# Patient Record
Sex: Male | Born: 1975 | Race: White | Hispanic: No | Marital: Married | Smoking: Former smoker
Health system: Southern US, Community
[De-identification: ages and names within clinical notes are randomized; demographics above are authoritative.]

## PROBLEM LIST (undated history)

## (undated) DIAGNOSIS — M1990 Unspecified osteoarthritis, unspecified site: Secondary | ICD-10-CM

## (undated) DIAGNOSIS — K5792 Diverticulitis of intestine, part unspecified, without perforation or abscess without bleeding: Secondary | ICD-10-CM

## (undated) DIAGNOSIS — N200 Calculus of kidney: Secondary | ICD-10-CM

## (undated) HISTORY — PX: CHOLECYSTECTOMY: SHX55

## (undated) HISTORY — PX: TONSILLECTOMY: SUR1361

## (undated) HISTORY — DX: Calculus of kidney: N20.0

## (undated) HISTORY — DX: Unspecified osteoarthritis, unspecified site: M19.90

---

## 2005-02-10 ENCOUNTER — Emergency Department (HOSPITAL_COMMUNITY): Admission: EM | Admit: 2005-02-10 | Discharge: 2005-02-10 | Payer: Self-pay | Admitting: Family Medicine

## 2005-04-13 ENCOUNTER — Emergency Department (HOSPITAL_COMMUNITY): Admission: EM | Admit: 2005-04-13 | Discharge: 2005-04-13 | Payer: Self-pay | Admitting: Family Medicine

## 2005-04-14 ENCOUNTER — Encounter: Admission: RE | Admit: 2005-04-14 | Discharge: 2005-04-14 | Payer: Self-pay | Admitting: Family Medicine

## 2006-05-23 ENCOUNTER — Emergency Department (HOSPITAL_COMMUNITY): Admission: EM | Admit: 2006-05-23 | Discharge: 2006-05-23 | Payer: Self-pay | Admitting: Emergency Medicine

## 2007-01-27 HISTORY — PX: VASECTOMY: SHX75

## 2007-02-28 ENCOUNTER — Encounter: Admission: RE | Admit: 2007-02-28 | Discharge: 2007-02-28 | Payer: Self-pay | Admitting: Internal Medicine

## 2007-04-06 ENCOUNTER — Emergency Department (HOSPITAL_COMMUNITY): Admission: EM | Admit: 2007-04-06 | Discharge: 2007-04-06 | Payer: Self-pay | Admitting: Emergency Medicine

## 2008-09-06 ENCOUNTER — Emergency Department (HOSPITAL_COMMUNITY): Admission: EM | Admit: 2008-09-06 | Discharge: 2008-09-06 | Payer: Self-pay | Admitting: Emergency Medicine

## 2008-11-19 ENCOUNTER — Emergency Department (HOSPITAL_COMMUNITY): Admission: EM | Admit: 2008-11-19 | Discharge: 2008-11-19 | Payer: Self-pay | Admitting: Emergency Medicine

## 2009-03-08 ENCOUNTER — Encounter: Admission: RE | Admit: 2009-03-08 | Discharge: 2009-03-08 | Payer: Self-pay | Admitting: Internal Medicine

## 2009-09-17 ENCOUNTER — Encounter: Admission: RE | Admit: 2009-09-17 | Discharge: 2009-09-17 | Payer: Self-pay | Admitting: Unknown Physician Specialty

## 2010-05-03 LAB — POCT URINALYSIS DIP (DEVICE)
Hgb urine dipstick: NEGATIVE
Nitrite: NEGATIVE
Protein, ur: 30 mg/dL — AB
Urobilinogen, UA: 1 mg/dL (ref 0.0–1.0)

## 2010-05-09 ENCOUNTER — Inpatient Hospital Stay (INDEPENDENT_AMBULATORY_CARE_PROVIDER_SITE_OTHER)
Admission: RE | Admit: 2010-05-09 | Discharge: 2010-05-09 | Disposition: A | Discharge status: Home / Self Care | Payer: BLUE CROSS/BLUE SHIELD | Source: Ambulatory Visit | Attending: Emergency Medicine | Admitting: Emergency Medicine

## 2010-05-09 DIAGNOSIS — L255 Unspecified contact dermatitis due to plants, except food: Secondary | ICD-10-CM

## 2010-09-04 ENCOUNTER — Ambulatory Visit (INDEPENDENT_AMBULATORY_CARE_PROVIDER_SITE_OTHER): Payer: BLUE CROSS/BLUE SHIELD | Admitting: General Surgery

## 2010-09-04 ENCOUNTER — Encounter (INDEPENDENT_AMBULATORY_CARE_PROVIDER_SITE_OTHER): Payer: Self-pay | Admitting: General Surgery

## 2010-09-04 NOTE — Progress Notes (Signed)
Addended byLiliana Cline on: 09/04/2010 11:55 AM   Modules accepted: Orders

## 2010-09-04 NOTE — Progress Notes (Signed)
Subjective:   Obesity and joint pain  Patient ID: Barry Parsons, male   DOB: 1975/03/21, 35 y.o.   MRN: [id number]  HPI Patient is a 35 year old male here for consideration for surgical treatment for morbid obesity. He states that he has struggled with his weight since he was a young adult. He is able to lose a lot of weight at one time but then experiences inevitable weight regain. He is concerned about his long-term health and has significant arthritis and chronic joint pain. His wife has had successful lap band surgery. He is interested in lap band surgery due to its less invasive nature. He is concerned about his long-term health and his activity related to his arthritis he has been our initial information seminar and of course is very knowledgeable about lap band because of his wife's surgery.  Past Medical History  Diagnosis Date  . Weight increase   . Sleep difficulties   . Arthritis   . Kidney stones    Past Surgical History  Procedure Date  . Vasectomy   . Tonsillectomy    Current Outpatient Prescriptions  Medication Sig Dispense Refill  . traMADol (ULTRAM) 50 MG tablet Ad lib.       History  Substance Use Topics  . Smoking status: Former Games developer  . Smokeless tobacco: Not on file  . Alcohol Use: Yes   Review of Systems  Constitutional: Negative.   HENT: Negative.   Eyes: Negative.   Respiratory: Negative.   Cardiovascular: Negative.   Gastrointestinal: Negative for nausea, vomiting, abdominal pain and blood in stool.       Occ reflux  Genitourinary: Negative.   Musculoskeletal: Positive for arthralgias.  Neurological: Negative.        Objective:   Physical Exam General: Obese but otherwise a well appearing Caucasian male Skin: No rash or infection Lymph nodes: No palpable cervical or supraclavicular or axillary nodes Lungs: Clear without wheezing or increased work of breathing Cardiovascular: Regular rate and rhythm without murmur. No JVD or edema. Peripheral  pulses intact. Abdomen: Obese. Soft and nontender. No palpable masses or organomegaly. No hernias. Extremities: No joint swelling or deformity or edema Neurologic: Alert and fully oriented. Gait normal.    Assessment:     35 year old male with progressive morbid obesity unresponsive to medical management and comorbidities of arthritis. He prefers lap band surgery and I believe this would be an excellent choice for him. We discussed the nature of the surgery and expected results in detail. We discussed operative risks of anesthetic complications, bleeding, infection, intestinal injury, and obstruction. We discussed long-term risks of failure to lose weight, erosion, slippage, mechanical failure, and others. He was given a complete consent form to review.    Plan:     We'll proceed with preoperative psychologic and nutrition the assessment, upper GI series, and lab work. I'll see him back after the studies

## 2010-09-04 NOTE — Patient Instructions (Signed)
Call for any questions about preoperative tests.

## 2010-09-13 LAB — COMPREHENSIVE METABOLIC PANEL
Albumin: 4.7 g/dL (ref 3.5–5.2)
CO2: 20 mEq/L (ref 19–32)
Calcium: 9 mg/dL (ref 8.4–10.5)
Chloride: 102 mEq/L (ref 96–112)
Glucose, Bld: 102 mg/dL — ABNORMAL HIGH (ref 70–99)
Potassium: 4.2 mEq/L (ref 3.5–5.3)
Sodium: 143 mEq/L (ref 135–145)
Total Bilirubin: 0.5 mg/dL (ref 0.3–1.2)
Total Protein: 7.5 g/dL (ref 6.0–8.3)

## 2010-09-13 LAB — LIPID PANEL
Cholesterol: 194 mg/dL (ref 0–200)
HDL: 38 mg/dL — ABNORMAL LOW (ref >39)
LDL Cholesterol: 132 mg/dL — ABNORMAL HIGH (ref 0–99)
Triglycerides: 121 mg/dL (ref <150)

## 2010-09-13 LAB — CBC WITH DIFFERENTIAL/PLATELET
Basophils Absolute: 0 10*3/uL (ref 0.0–0.1)
Lymphocytes Relative: 32 % (ref 12–46)
MCH: 31.7 pg (ref 26.0–34.0)
MCHC: 33 g/dL (ref 30.0–36.0)
Monocytes Relative: 5 % (ref 3–12)
Neutrophils Relative %: 59 % (ref 43–77)
Platelets: 228 10*3/uL (ref 150–400)
RBC: 5.01 MIL/uL (ref 4.22–5.81)
RDW: 13.2 % (ref 11.5–15.5)

## 2010-09-13 LAB — T4: T4, Total: 10.2 ug/dL (ref 5.0–12.5)

## 2010-09-13 LAB — TSH: TSH: 2.367 u[IU]/mL (ref 0.350–4.500)

## 2010-09-22 ENCOUNTER — Encounter: Payer: BLUE CROSS/BLUE SHIELD | Attending: General Surgery | Admitting: *Deleted

## 2010-09-22 ENCOUNTER — Encounter: Payer: Self-pay | Admitting: *Deleted

## 2010-09-22 DIAGNOSIS — Z01818 Encounter for other preprocedural examination: Secondary | ICD-10-CM | POA: Insufficient documentation

## 2010-09-22 DIAGNOSIS — Z713 Dietary counseling and surveillance: Secondary | ICD-10-CM | POA: Insufficient documentation

## 2010-09-22 NOTE — Patient Instructions (Signed)
   Follow Pre-Op Nutrition Goals to prepare for LAGB Surgery.   Call the [facility] at [phone] once you have been given your surgery date to enrolled in the Pre-Op Nutrition Class. You will need to attend this nutrition class 3-4 weeks prior to your surgery. 

## 2010-09-22 NOTE — Progress Notes (Signed)
  Patient was seen on 09/22/2010 for Pre-Operative LAGB Nutrition Assessment. Assessment and letter of approval faxed to [facility] Bariatric Surgery Program coordinator on 09/22/2010.    Handouts given during visit include:  Pre-Op Goals Handout  Patient to call for Pre-Op and Post-Op Nutrition Education at the [facility] when surgery is scheduled.

## 2010-09-24 ENCOUNTER — Ambulatory Visit (HOSPITAL_COMMUNITY)
Admission: RE | Admit: 2010-09-24 | Discharge: 2010-09-24 | Disposition: A | Discharge status: Home / Self Care | Payer: BLUE CROSS/BLUE SHIELD | Source: Ambulatory Visit | Attending: General Surgery | Admitting: General Surgery

## 2010-09-24 ENCOUNTER — Other Ambulatory Visit: Payer: Self-pay

## 2010-09-24 DIAGNOSIS — K449 Diaphragmatic hernia without obstruction or gangrene: Secondary | ICD-10-CM | POA: Insufficient documentation

## 2010-09-24 DIAGNOSIS — M129 Arthropathy, unspecified: Secondary | ICD-10-CM | POA: Insufficient documentation

## 2010-11-05 ENCOUNTER — Encounter (INDEPENDENT_AMBULATORY_CARE_PROVIDER_SITE_OTHER): Payer: Self-pay | Admitting: General Surgery

## 2010-11-05 ENCOUNTER — Ambulatory Visit (INDEPENDENT_AMBULATORY_CARE_PROVIDER_SITE_OTHER): Payer: BC Managed Care – PPO | Admitting: General Surgery

## 2010-11-05 NOTE — Progress Notes (Signed)
Patient returns for a preop visit prior to planned lap band surgery. Rereviewed his workup to date. There were no concerns on psychologic or nutrition evaluation. Lab work was unremarkable.  His upper GI series did show a small hiatal hernia. He does have mild reflux controlled with over-the-counter medications. I discussed would look for a hiatal hernia during surgery and repair this if it looked significant.  The consent form was reviewed and all his questions were answered.

## 2010-11-05 NOTE — Patient Instructions (Signed)
Call for any questions you have preoperatively

## 2010-11-14 ENCOUNTER — Other Ambulatory Visit (INDEPENDENT_AMBULATORY_CARE_PROVIDER_SITE_OTHER): Payer: Self-pay | Admitting: General Surgery

## 2010-11-14 ENCOUNTER — Encounter (HOSPITAL_COMMUNITY)
Admission: RE | Admit: 2010-11-14 | Discharge: 2010-11-14 | Disposition: A | Discharge status: Home / Self Care | Payer: BC Managed Care – PPO | Source: Ambulatory Visit | Attending: General Surgery | Admitting: General Surgery

## 2010-11-14 LAB — CBC
MCHC: 35.1 g/dL (ref 30.0–36.0)
MCV: 89.4 fL (ref 78.0–100.0)
RDW: 12.2 % (ref 11.5–15.5)

## 2010-11-14 LAB — COMPREHENSIVE METABOLIC PANEL
AST: 22 U/L (ref 0–37)
BUN: 16 mg/dL (ref 6–23)
CO2: 25 mEq/L (ref 19–32)
Chloride: 101 mEq/L (ref 96–112)
Creatinine, Ser: 0.69 mg/dL (ref 0.50–1.35)
GFR calc Af Amer: >90 mL/min (ref >90)
GFR calc non Af Amer: >90 mL/min (ref >90)
Potassium: 3.9 mEq/L (ref 3.5–5.1)
Sodium: 136 mEq/L (ref 135–145)
Total Protein: 7.6 g/dL (ref 6.0–8.3)

## 2010-11-14 LAB — DIFFERENTIAL
Eosinophils Absolute: 0.3 10*3/uL (ref 0.0–0.7)
Eosinophils Relative: 3 % (ref 0–5)
Lymphocytes Relative: 28 % (ref 12–46)
Lymphs Abs: 2.5 10*3/uL (ref 0.7–4.0)
Neutrophils Relative %: 64 % (ref 43–77)

## 2010-11-14 LAB — SURGICAL PCR SCREEN: Staphylococcus aureus: POSITIVE — AB

## 2010-11-18 ENCOUNTER — Ambulatory Visit (HOSPITAL_COMMUNITY)
Admission: RE | Admit: 2010-11-18 | Discharge: 2010-11-18 | Disposition: A | Discharge status: Home / Self Care | Payer: BC Managed Care – PPO | Source: Ambulatory Visit | Attending: General Surgery | Admitting: General Surgery

## 2010-11-18 ENCOUNTER — Ambulatory Visit (HOSPITAL_COMMUNITY): Payer: BC Managed Care – PPO

## 2010-11-18 DIAGNOSIS — Z01812 Encounter for preprocedural laboratory examination: Secondary | ICD-10-CM | POA: Insufficient documentation

## 2010-11-18 DIAGNOSIS — Z6841 Body Mass Index (BMI) 40.0 and over, adult: Secondary | ICD-10-CM

## 2010-11-18 HISTORY — PX: LAPAROSCOPIC GASTRIC BANDING: SHX1100

## 2010-11-19 NOTE — Op Note (Signed)
NAMEMarland Kitchen  SEVAG, SHEARN NO.:  [id number]  MEDICAL RECORD NO.:  [id number]  LOCATION:  DAYL                         FACILITY:  [facility]  PHYSICIAN:  Sharlet Salina T. Braylee Bosher, M.D.DATE OF BIRTH:  July 10, 1975  DATE OF PROCEDURE:  11/18/2010 DATE OF DISCHARGE:                              OPERATIVE REPORT   PREOPERATIVE DIAGNOSES:  Morbid obesity.  POSTOPERATIVE DIAGNOSIS:  Morbid obesity.  SURGICAL PROCEDURE:  Placement of laparoscopic adjustable gastric band.  SURGEON:  Lorne Skeens. Orlena Garmon, M.D.  ASSISTANT:  Sandria Bales. Ezzard Standing, M.D.  ANESTHESIA:  General.  BRIEF HISTORY:  The patient is a 35 year old male with a history of progressive morbid obesity that has been unresponsive to multiple efforts at medical management.  He presents at a BMI of 43.35, 317 pounds, at 5 feet 11.5 inches and comorbidity of arthritis.  After extensive preoperative discussion regarding options and complications detailed elsewhere, we have elected to proceed with placement of laparoscopic adjustable gastric band for treatment of his morbid obesity.  DESCRIPTION OF OPERATION:  The patient was brought to the operating room, placed in supine position on the operating table and general endotracheal anesthesia was induced.  The abdomen was widely sterilely prepped and draped.  He received preoperative IV antibiotics.  PAS were in place.  Correct patient and procedure were verified.  Local anesthesia was used to infiltrate the trocar sites.  Access was obtained in the left upper quadrant midclavicular line with a 5 mm Optiview trocar without difficulty and pneumoperitoneum established.  Under direct vision, an 11 mm trocar was placed above the left umbilicus for the camera port.  A 15 mm trocar in the right upper quadrant midclavicular line, a 5 mm trocar laterally in the right upper quadrant and through a 5 mm subxiphoid site, the Nathanson retractor was placed in the left lobe of liver,  elevated with excellent exposure of the upper stomach and hiatus.  The patient was placed in reverse Trendelenburg. The peritoneum overlying the left crus of the angle of His was incised and careful blunt dissection carried back along the left crus toward the retrogastric area.  The pars flaccida was opened with a hook cautery and the base of the right crus and there crossing fat was identified and the peritoneum here opened.  The finger dissector was then passed without difficulty retrogastrically and deployed up through the previously dissected area of angle of His.  An AP large flushed band system was introduced into the abdomen.  The tubing placed and the finger dissector brought out through the retrogastric tunnel and the band brought back through the retrogastric tunnel without difficulty.  With the sizing tube introduced orally into the stomach, the band was snapped into place without difficulty.  The sizing tube was removed.  Holding tubing toward the patient's feet, the fundus was sutured up over the band to the small gastric pouch with 3 interrupted 2-0 Ethibond sutures.  The band appeared to be in excellent position.  There was no evidence of bleeding or trocar injury.  The tubing was brought out through the right mid abdominal site.  The Nathanson retractor was removed under direct vision and all CO2 evacuated.  Trocars removed.  The right mid abdominal incision was lengthened slightly and a subcutaneous pocket created lateral to this.  The tubing was cut and attached to the port to which a piece of Prolene mesh had been sutured to the back.  The port was placed in a subcutaneous position just lateral to the incision.  The tubing introduced smoothly into the abdomen.  This incision was closed with running subcutaneous 3-0 Vicryl and all incisions closed with subcuticular Monocryl and Dermabond.  Sponge and needle counts correct. The patient was taken to recovery in good  condition.     Lorne Skeens. Gail Creekmore, M.D.     Tory Emerald  D:  11/18/2010  T:  11/18/2010  Job:  [id number]  Electronically Signed by [name] M.D. on 11/19/2010 10:53:52 AM

## 2010-11-21 ENCOUNTER — Telehealth (INDEPENDENT_AMBULATORY_CARE_PROVIDER_SITE_OTHER): Payer: Self-pay

## 2010-11-21 ENCOUNTER — Other Ambulatory Visit (INDEPENDENT_AMBULATORY_CARE_PROVIDER_SITE_OTHER): Payer: Self-pay | Admitting: General Surgery

## 2010-11-21 DIAGNOSIS — G8918 Other acute postprocedural pain: Secondary | ICD-10-CM

## 2010-11-21 MED ORDER — OXYCODONE-ACETAMINOPHEN 5-325 MG/5ML PO SOLN: ORAL | Status: DC

## 2010-11-21 NOTE — Telephone Encounter (Signed)
Pt called. Doing well except still having enough discomfort that pt states he needs to get refill of his roxicet prior to weekend. Pt advised will will review this with Dr Johna Sheriff and contact pt. Pt aware someone will have to pick up written rx at office if Dr Johna Sheriff oks refill. Pt can be reached at [phone].

## 2010-11-21 NOTE — Telephone Encounter (Signed)
Called pt to p/u Rx at front desk. Pt said he will sen his wife to p/u Rx./[facility]

## 2010-11-25 ENCOUNTER — Ambulatory Visit
Admission: RE | Admit: 2010-11-25 | Discharge: 2010-11-25 | Disposition: A | Discharge status: Home / Self Care | Payer: BC Managed Care – PPO | Source: Ambulatory Visit | Attending: General Surgery | Admitting: General Surgery

## 2010-11-25 ENCOUNTER — Telehealth (INDEPENDENT_AMBULATORY_CARE_PROVIDER_SITE_OTHER): Payer: Self-pay

## 2010-11-25 ENCOUNTER — Other Ambulatory Visit (INDEPENDENT_AMBULATORY_CARE_PROVIDER_SITE_OTHER): Payer: Self-pay | Admitting: General Surgery

## 2010-11-25 DIAGNOSIS — R071 Chest pain on breathing: Secondary | ICD-10-CM

## 2010-11-25 DIAGNOSIS — Z9884 Bariatric surgery status: Secondary | ICD-10-CM

## 2010-11-25 NOTE — Telephone Encounter (Addendum)
Patient called c/o sharp pains when taking deep breaths, states he has slept sitting up through the weekend and has had no relief.  He feel's a sharp shooting pain when he take deep breaths.  Pain started in left shoulder and extends down into abdomen also. Informed patient that I will review this with Dr. Johna Sheriff and call patient back with further instructions. (s/p Gastric Bypass/RNY 11/18/10)

## 2010-11-26 ENCOUNTER — Encounter (INDEPENDENT_AMBULATORY_CARE_PROVIDER_SITE_OTHER): Payer: Self-pay | Admitting: General Surgery

## 2010-11-26 ENCOUNTER — Ambulatory Visit (INDEPENDENT_AMBULATORY_CARE_PROVIDER_SITE_OTHER): Payer: BC Managed Care – PPO | Admitting: General Surgery

## 2010-11-26 VITALS — BP 134/90 | HR 76 | Temp 97.4°F | Resp 16 | Ht 72.0 in | Wt 310.2 lb

## 2010-11-26 DIAGNOSIS — Z09 Encounter for follow-up examination after completed treatment for conditions other than malignant neoplasm: Secondary | ICD-10-CM

## 2010-11-26 NOTE — Progress Notes (Signed)
Patient returns for followup 9 days post lap band placement. He is seen early due to a complaint of chest pain. He states that for the past 3 days he has had pain when he takes a deep breath. This is sharp pain in his left chest radiating up around his left shoulder and out to his lateral left chest. It is unchanged over the past 3 days. He has no nausea or vomiting and is tolerating his fluids well. He does not feel short of breath. No cough.  On examination he is afebrile with normal vital signs. He does not appear at all ill. No increased work of breathing. Breath sounds are clear and equal. Abdomen is soft and nontender and his incisions look fine.  We obtained a CBC which shows a normal white count and hemoglobin. Chest x-ray was unremarkable.  Assessment and plan: Left chest pain with deep breath following lap band. Strongly suspect this is just some postoperative irritation under the diaphragm and his surgical site. We discussed the possibility of pulmonary embolus or stomach injury but given his exam and lab and chest x-ray results I think this is highly unlikely. Her going to observe this closely for now. He will call me if he notices any worsening. We discussed going ahead now with a CT scan of the chest and Gastrografin swallow but I think this is low yield and he is comfortable with close observation for now. He is an appointment to see me next week A. test.

## 2010-12-01 NOTE — Telephone Encounter (Signed)
Patient called requesting a refill on his pain medication, Dr. Johna Sheriff reviewed request and approved Roxicet Elixir, patient also need's to have a Chest X-ray and CBC w/diff drawn.  Patient aware and will go to [facility] imaging for CXR and his PCP for labwork.

## 2010-12-05 ENCOUNTER — Other Ambulatory Visit (INDEPENDENT_AMBULATORY_CARE_PROVIDER_SITE_OTHER): Payer: Self-pay

## 2010-12-05 ENCOUNTER — Encounter (INDEPENDENT_AMBULATORY_CARE_PROVIDER_SITE_OTHER): Payer: Self-pay | Admitting: General Surgery

## 2010-12-05 ENCOUNTER — Ambulatory Visit (INDEPENDENT_AMBULATORY_CARE_PROVIDER_SITE_OTHER): Payer: BC Managed Care – PPO | Admitting: General Surgery

## 2010-12-05 VITALS — BP 138/88 | HR 68 | Temp 98.3°F | Resp 20 | Ht 71.5 in | Wt 301.5 lb

## 2010-12-05 DIAGNOSIS — Z09 Encounter for follow-up examination after completed treatment for conditions other than malignant neoplasm: Secondary | ICD-10-CM

## 2010-12-05 DIAGNOSIS — G8918 Other acute postprocedural pain: Secondary | ICD-10-CM

## 2010-12-05 MED ORDER — OXYCODONE-ACETAMINOPHEN 5-325 MG/5ML PO SOLN: ORAL | Status: DC

## 2010-12-05 NOTE — Patient Instructions (Signed)
Call if symptoms worsen.

## 2010-12-05 NOTE — Telephone Encounter (Signed)
RX for Roxicet Elixir 5/325 mg, take 5-10 ml,  q4 hrs, prn pain, 250 ml, 0 refills, written in office

## 2010-12-05 NOTE — Progress Notes (Signed)
Patient is now 2-1/2 weeks following LAP-BAND placement. He has had difficulties with epigastric and chest pain but this is gradually getting better. 2 days ago he had another episode of fairly significant chest pain but has felt fine the last 2 days. He does have some restriction in the morning which eases through the day. On exam he appears well. Abdomen is soft and nontender. Lungs are clear.  Assessment and plan: Status post lap band. He has had some unusual pain but has not appeared ill and is steadily improving. We will therefore hold off on any more detailed workup. We did not do an adjustment. He will call if he is not feeling steadily better I will see him back in 2 weeks.

## 2011-01-01 ENCOUNTER — Ambulatory Visit (INDEPENDENT_AMBULATORY_CARE_PROVIDER_SITE_OTHER): Payer: BC Managed Care – PPO | Admitting: General Surgery

## 2011-01-01 NOTE — Progress Notes (Signed)
Patient returns following LAP-BAND placement of APL band on 10 2312. He initially was having some epigastric and chest pain postoperatively but this has resolved. He feels his appetite is increased and he is stopping eating more by willpower than feeling full. His weight is up about 5 pounds since his last postop visit. He's having no vomiting or regurgitation.  On exam he appears well and his incisions and port site looks fine  With this information we went ahead with an initial fill today of 2 cc which brings him to a total of 5 cc. We reviewed diet and exercise stretches. He will return in 4 weeks.

## 2011-01-01 NOTE — Patient Instructions (Signed)
Beginning her solid food tomorrow very cautiously. Concentrate on small bites and eating slowly. Due not drink for 30 minutes before and one hour after meals. Work toward 45 minutes of steady exercise at least 3 times per week.

## 2011-01-01 NOTE — Progress Notes (Signed)
Patient had some difficulty with water after we added 2 cc to his band and I removed 1 cc so that now he has a total of 4 cc.

## 2011-02-05 ENCOUNTER — Ambulatory Visit (INDEPENDENT_AMBULATORY_CARE_PROVIDER_SITE_OTHER): Payer: BC Managed Care – PPO | Admitting: General Surgery

## 2011-02-05 ENCOUNTER — Encounter (INDEPENDENT_AMBULATORY_CARE_PROVIDER_SITE_OTHER): Payer: Self-pay | Admitting: General Surgery

## 2011-02-05 NOTE — Progress Notes (Signed)
Patient returns for follow up of lab and placed October 2012. He has had one fill. He noticed definite increase in restriction but he feels that a recent weeks this is easing and he is able to eat more. He is having rare vomiting only if he eats 2 faster does not chew well. On questioning he really has not been exercising.  On exam his weight is actually up 3 pounds from his last visit at 310 which is the same as his preop weight.  With this information we went ahead with a 1 cc fill today debridement 5 cc. Were reviewed the importance of exercise. We discussed diet and the importance of getting slowly with small bites he will return in 6 weeks.

## 2011-02-05 NOTE — Patient Instructions (Signed)
Begin regular exercise. Concentrate on small bites, eating slowly, chewing well.

## 2011-03-26 ENCOUNTER — Encounter (INDEPENDENT_AMBULATORY_CARE_PROVIDER_SITE_OTHER): Payer: BC Managed Care – PPO | Admitting: General Surgery

## 2011-06-24 ENCOUNTER — Emergency Department (INDEPENDENT_AMBULATORY_CARE_PROVIDER_SITE_OTHER): Payer: BC Managed Care – PPO

## 2011-06-24 ENCOUNTER — Emergency Department (HOSPITAL_COMMUNITY)
Admission: EM | Admit: 2011-06-24 | Discharge: 2011-06-24 | Disposition: A | Discharge status: Home / Self Care | Payer: BC Managed Care – PPO | Source: Home / Self Care | Attending: Emergency Medicine | Admitting: Emergency Medicine

## 2011-06-24 ENCOUNTER — Encounter (HOSPITAL_COMMUNITY): Payer: Self-pay | Admitting: *Deleted

## 2011-06-24 DIAGNOSIS — S139XXA Sprain of joints and ligaments of unspecified parts of neck, initial encounter: Secondary | ICD-10-CM

## 2011-06-24 DIAGNOSIS — S1093XA Contusion of unspecified part of neck, initial encounter: Secondary | ICD-10-CM

## 2011-06-24 DIAGNOSIS — S0990XA Unspecified injury of head, initial encounter: Secondary | ICD-10-CM

## 2011-06-24 DIAGNOSIS — S0003XA Contusion of scalp, initial encounter: Secondary | ICD-10-CM

## 2011-06-24 DIAGNOSIS — S0083XA Contusion of other part of head, initial encounter: Secondary | ICD-10-CM

## 2011-06-24 MED ORDER — CYCLOBENZAPRINE HCL 10 MG PO TABS: 10.0000 mg | ORAL_TABLET | Freq: Three times a day (TID) | ORAL | Status: AC | PRN

## 2011-06-24 MED ORDER — IBUPROFEN 600 MG PO TABS: 600.0000 mg | ORAL_TABLET | Freq: Four times a day (QID) | ORAL | Status: AC | PRN

## 2011-06-24 NOTE — ED Provider Notes (Signed)
History     CSN: [id number]  Arrival date & time 06/24/11  1651   First MD Initiated Contact with Patient 06/24/11 1652      Chief Complaint  Patient presents with  . Head Injury    (Consider location/radiation/quality/duration/timing/severity/associated sxs/prior treatment) HPI Comments: Patient reports that Saturday during a camper he fell and he hit his head. Patient describes he did not lose consciousness and was perfectly aware of what happened afterwards. He is been having headache since then ( points to posterior right-sided cervical region ). Is also been feeling dizzy mainly when he stands up. "As I think I got a bad cervical whiplash "  During review of systems patient specifically denied feeling nauseous, vomiting, visual changes, weakness of any of his upper or lower extremities.   Patient is a 36 y.o. male presenting with head injury. The history is provided by the patient.  Head Injury  The incident occurred more than 2 days ago. He came to the ER via walk-in. The injury mechanism was a direct blow. There was no loss of consciousness. There was no blood loss. The quality of the pain is described as dull. The pain is at a severity of 6/10. The pain is moderate. The pain has been constant since the injury. Pertinent negatives include no numbness, no blurred vision, no vomiting, no tinnitus, no disorientation, no weakness and no memory loss. He was found conscious by EMS personnel. He has tried nothing for the symptoms.    Past Medical History  Diagnosis Date  . Weight increase   . Sleep difficulties   . Arthritis   . Kidney stones     Past Surgical History  Procedure Date  . Vasectomy   . Tonsillectomy   . Laparoscopic gastric banding 11/18/10    Family History  Problem Relation Age of Onset  . Heart disease Mother     History  Substance Use Topics  . Smoking status: Former Smoker    Quit date: 01/27/2008  . Smokeless tobacco: Never Used  . Alcohol Use:  Yes      Review of Systems  Constitutional: Negative for chills, diaphoresis, activity change, appetite change and fatigue.  HENT: Positive for neck pain and neck stiffness. Negative for hearing loss, tinnitus and ear discharge.   Eyes: Negative for blurred vision and redness.  Gastrointestinal: Negative for nausea and vomiting.  Musculoskeletal: Negative for back pain.  Skin: Positive for wound. Negative for color change and rash.  Neurological: Positive for dizziness and headaches. Negative for tremors, seizures, syncope, facial asymmetry, speech difficulty, weakness and numbness.  Psychiatric/Behavioral: Negative for memory loss.    Allergies  Review of patient's allergies indicates no known allergies.  Home Medications   Current Outpatient Rx  Name Route Sig Dispense Refill  . TRAMADOL HCL 50 MG PO TABS  as needed.       BP 146/90  Pulse 64  Temp(Src) 98.6 F (37 C) (Oral)  Resp 18  SpO2 97%  Physical Exam  Nursing note and vitals reviewed. Constitutional: He is oriented to person, place, and time. He appears well-developed and well-nourished.  HENT:  Head: Normocephalic and atraumatic.  Eyes: Conjunctivae are normal.  Neck: Neck supple. Muscular tenderness present. No spinous process tenderness present. No rigidity. Decreased range of motion present. No tracheal deviation and no edema present. No Brudzinski's sign noted. No thyromegaly present.  Abdominal: He exhibits no distension.  Musculoskeletal: He exhibits tenderness.       Back:  Lymphadenopathy:  He has no cervical adenopathy.  Neurological: He is alert and oriented to person, place, and time. He has normal strength. He displays no atrophy and no tremor. No cranial nerve deficit or sensory deficit. He exhibits normal muscle tone. Coordination normal.  Skin: No rash noted. No erythema.    ED Course  Procedures (including critical care time)  Labs Reviewed - No data to display No results  found.   No diagnosis found.    MDM  Head and neck contusion. Without loss of consciousness seen and evaluated after 5 days of reported injury. Patient had no neurological focal deficits. Exam was significant for right-sided trapezium discomfort elicited with palpation and active range of motion of his right shoulder and neck. Patient was instructed about symptoms that will work further evaluation in the emergency department patient agree with treatment plan and observation.        Jimmie Molly, MD 06/24/11 1850

## 2011-06-24 NOTE — ED Notes (Addendum)
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

## 2011-06-24 NOTE — Discharge Instructions (Signed)
  As discussed your symptoms at this point are more consistent with muscular contusions and sprains mostly in your  cervical region and trapezium. Recommended you take this muscle relaxer and ibuprophen for pain and discomfort. We also discussed what symptoms, aren't further evaluation in the emergency department. Read your discharge instructions as well for further information    Cervical Sprain A cervical sprain is when the ligaments in the neck stretch or tear. The ligaments are the tissues that hold the neck bones in place. HOME CARE   Put ice on the injured area.   Put ice in a plastic bag.   Place a towel between your skin and the bag.   Leave the ice on for 15 to 20 minutes, 3 to 4 times a day.   Only take medicine as told by your doctor.   Keep all doctor visits as told.   Keep all physical therapy visits as told.   If your doctor gives you a neck collar, wear it as told.   Do not drive while wearing a neck collar.   Adjust your work station so that you have good posture while you work.   Avoid positions and activities that make your problems worse.   Warm up and stretch before being active.  GET HELP RIGHT AWAY IF:   You are bleeding or your stomach is upset.   You have an allergic reaction to your medicine.   Your problems (symptoms) get worse.   You develop new problems.   You lose feeling (numbness) or you cannot move (paralysis) any part of your body.   You have tingling or weakness in any part of your body.   Your pain is not controlled with medicine.   You cannot take less pain medicine over time as planned.   Your activity level does not improve as expected.  MAKE SURE YOU:   Understand these instructions.   Will watch your condition.   Will get help right away if you are not doing well or get worse.  Document Released: 07/01/2007 Document Revised: 01/01/2011 Document Reviewed: 10/16/2010 [employer] Patient Information 2012 [place], [place].

## 2011-09-16 ENCOUNTER — Telehealth (INDEPENDENT_AMBULATORY_CARE_PROVIDER_SITE_OTHER): Payer: Self-pay

## 2011-09-16 ENCOUNTER — Other Ambulatory Visit (INDEPENDENT_AMBULATORY_CARE_PROVIDER_SITE_OTHER): Payer: Self-pay

## 2011-09-16 DIAGNOSIS — Z9884 Bariatric surgery status: Secondary | ICD-10-CM

## 2011-09-16 NOTE — Telephone Encounter (Signed)
Barry Parsons w/Nutrition Management called requesting a Referral for Lap Band Refresher.  Epic order placed.

## 2011-09-29 ENCOUNTER — Encounter: Payer: BC Managed Care – PPO | Attending: General Surgery | Admitting: *Deleted

## 2011-09-29 ENCOUNTER — Encounter: Payer: Self-pay | Admitting: *Deleted

## 2011-09-29 DIAGNOSIS — Z713 Dietary counseling and surveillance: Secondary | ICD-10-CM | POA: Insufficient documentation

## 2011-09-29 DIAGNOSIS — Z9884 Bariatric surgery status: Secondary | ICD-10-CM | POA: Insufficient documentation

## 2011-09-29 DIAGNOSIS — Z09 Encounter for follow-up examination after completed treatment for conditions other than malignant neoplasm: Secondary | ICD-10-CM | POA: Insufficient documentation

## 2011-09-29 NOTE — Progress Notes (Signed)
  Follow-up visit:  11 Months Post-Operative LAGB Surgery  Medical Nutrition Therapy:  Appt start time: 1115 end time:  1215.  Primary concerns today: Post-operative Bariatric Surgery Nutrition Management. Reinhold returns today to re-establish care and get back on track. He has not returned to [facility] since his Pre-Op assessment.  Dietary recall reveals excessive CHO intake, meal skipping, and large portions. Drinks regular carbonated beverages with ETOH but states "not often".  Participates in the BELT program and has recently added weight training. Wife is 2 yrs s/p LAGB as well.    Surgery date: 11/18/10 Start weight at [facility]: 319.0 lbs (09/22/10)  Weight today: 287.5 lbs Weight change: 31.5 lbs (since assessment) Total weight lost: 31.5 lbs BMI: 40.1 kg/m^2  Weight goal: 220 lbs % Weight goal met: 32%  TANITA  BODY COMP RESULTS  09/29/11   %Fat 43.1%   Fat Mass (lbs) 124.0   Fat Free Mass (lbs) 163.5   Total Body Water (lbs) 119.5   24-hr recall:  B (AM): Skips OR 12-16 oz oatmeal w/ raisins and brown sugar (in cafe at work) Snk (AM): None   L (PM): Leftovers or 3-4 oz grilled chicken on salad w/ creamy caesar dressing Snk (PM): 12 oz yogurt (from cafe) D (PM): Chicken chili w/ dirty rice Snk (PM): sweets/baked goods (10-12 oz)  Fluid intake: 95-100 oz (mainly water with flavor enhancer) Estimated total protein intake: 70-80 g  Medications: Tramadol (prn) Supplementation: None  Using straws: No Drinking while eating: Yes; reports taking "sips" Hair loss: No Carbonated beverages: Yes; drinks regular ginger ale with ETOH (liquor) - States "not often" N/V/D/C: None Last Lap-Band fill: 02/05/11; has not returned to surgeon since. Reports he is "definitely in the yellow zone".  Recent physical activity:  Participates in BELT program (M/W/F); recently started strength training on T/Th  Progress Towards Goal(s):  In progress.  Handouts given during visit include:  Bariatric  Surgery Specialized Post-Op Diet   Nutritional Diagnosis:  Ashley-3.3 Overweight/obesity As related to recent LAGB surgery.  As evidenced by patient returning to re-establish care d/t lack of post-op weight loss.    Intervention:  Nutrition education/reinforcement.  Monitoring/Evaluation:  Dietary intake, exercise, lap band fills, and body weight. Follow up in 4-6 weeks for 12 month post-op visit.

## 2011-09-29 NOTE — Patient Instructions (Addendum)
Goals:  Follow [facility] diet   Eat 3-6 small meals/snacks, every 3-5 hrs  Increase lean protein foods to meet 80g goal  Increase fluid intake to 64oz +  Add 15 grams of carbohydrate (fruit, whole grain, starchy vegetable) with meals  Avoid drinking 15 minutes before, during and 30 minutes after eating  Continue daily exercise.  Call surgeon for follow up appointment and possible fill.

## 2011-10-15 ENCOUNTER — Ambulatory Visit (INDEPENDENT_AMBULATORY_CARE_PROVIDER_SITE_OTHER): Payer: BC Managed Care – PPO | Admitting: Physician Assistant

## 2011-10-15 ENCOUNTER — Encounter (INDEPENDENT_AMBULATORY_CARE_PROVIDER_SITE_OTHER): Payer: Self-pay

## 2011-10-15 VITALS — BP 142/86 | HR 88 | Temp 97.2°F | Resp 18 | Ht 71.5 in | Wt 290.8 lb

## 2011-10-15 DIAGNOSIS — Z4651 Encounter for fitting and adjustment of gastric lap band: Secondary | ICD-10-CM

## 2011-10-15 NOTE — Patient Instructions (Signed)
Take clear liquids tonight. Thin protein shakes are ok to start tomorrow morning. Slowly advance your diet thereafter. Call us if you have persistent vomiting or regurgitation, night cough or reflux symptoms. Return as scheduled or sooner if you notice no changes in hunger/portion sizes.  

## 2011-10-15 NOTE — Progress Notes (Signed)
  HISTORY: Barry Parsons is a 36 y.o.male who received an AP-Large lap-band in October 2012 by Dr. Johna Sheriff. He was last seen in January for a fill and has since lost 16 lbs. He denies persistent regurgitation or reflux. He does complain of larger than needed portion sizes and persistent hunger. He's engaged in a regular exercise program.  VITAL SIGNS: Filed Vitals:   10/15/11 1405  BP: 142/86  Pulse: 88  Temp: 97.2 F (36.2 C)  Resp: 18    PHYSICAL EXAM: Physical exam reveals a very well-appearing 35 y.o.male in no apparent distress Neurologic: Awake, alert, oriented Psych: Bright affect, conversant Respiratory: Breathing even and unlabored. No stridor or wheezing Abdomen: Soft, nontender, nondistended to palpation. Incisions well-healed. No incisional hernias. Port easily palpated. Extremities: Atraumatic, good range of motion.  ASSESMENT: 36 y.o.  male  s/p AP-Large lap-band.   PLAN: The patient's port was accessed with a 20G Huber needle without difficulty. Clear fluid was aspirated and 1 mL saline was added to the port to give a total predicted volume of 6 mL. The patient was able to swallow water without difficulty following the procedure and was instructed to take clear liquids for the next 24-48 hours and advance slowly as tolerated.

## 2011-11-12 ENCOUNTER — Encounter (INDEPENDENT_AMBULATORY_CARE_PROVIDER_SITE_OTHER): Payer: BC Managed Care – PPO

## 2011-11-19 ENCOUNTER — Encounter (INDEPENDENT_AMBULATORY_CARE_PROVIDER_SITE_OTHER): Payer: Self-pay

## 2011-11-19 ENCOUNTER — Ambulatory Visit (INDEPENDENT_AMBULATORY_CARE_PROVIDER_SITE_OTHER): Payer: BC Managed Care – PPO | Admitting: Physician Assistant

## 2011-11-19 VITALS — BP 134/78 | HR 88 | Temp 98.1°F | Ht 71.5 in | Wt 285.2 lb

## 2011-11-19 DIAGNOSIS — Z4651 Encounter for fitting and adjustment of gastric lap band: Secondary | ICD-10-CM

## 2011-11-19 NOTE — Patient Instructions (Signed)
Take clear liquids tonight. Thin protein shakes are ok to start tomorrow morning. Slowly advance your diet thereafter. Call us if you have persistent vomiting or regurgitation, night cough or reflux symptoms. Return as scheduled or sooner if you notice no changes in hunger/portion sizes.  

## 2011-11-19 NOTE — Progress Notes (Signed)
  HISTORY: Barry Parsons is a 36 y.o.male who received an AP-Large lap-band in October 2012 by Dr. Johna Sheriff. He comes in with 6 lbs weight loss since his last visit. He denies regurgitation or reflux. He'd like a fill today as he's getting hungry soon after eating and he's eating more than expected.  VITAL SIGNS: Filed Vitals:   11/19/11 1216  BP: 134/78  Pulse: 88  Temp: 98.1 F (36.7 C)    PHYSICAL EXAM: Physical exam reveals a very well-appearing 36 y.o.male in no apparent distress Neurologic: Awake, alert, oriented Psych: Bright affect, conversant Respiratory: Breathing even and unlabored. No stridor or wheezing Abdomen: Soft, nontender, nondistended to palpation. Incisions well-healed. No incisional hernias. Port easily palpated. Extremities: Atraumatic, good range of motion.  ASSESMENT: 36 y.o.  male  s/p AP-Large lap-band.   PLAN: The patient's port was accessed with a 20G Huber needle without difficulty. Clear fluid was aspirated and 0.5 mL saline was added to the port to give a total predicted volume of 6.5 mL. The patient was able to swallow water without difficulty following the procedure and was instructed to take clear liquids for the next 24-48 hours and advance slowly as tolerated.

## 2011-12-31 ENCOUNTER — Encounter (INDEPENDENT_AMBULATORY_CARE_PROVIDER_SITE_OTHER): Payer: BC Managed Care – PPO

## 2012-05-19 ENCOUNTER — Emergency Department (HOSPITAL_COMMUNITY): Payer: BC Managed Care – PPO

## 2012-05-19 ENCOUNTER — Encounter (HOSPITAL_COMMUNITY): Payer: Self-pay | Admitting: Emergency Medicine

## 2012-05-19 ENCOUNTER — Emergency Department (HOSPITAL_COMMUNITY)
Admission: EM | Admit: 2012-05-19 | Discharge: 2012-05-19 | Disposition: A | Discharge status: Home / Self Care | Payer: BC Managed Care – PPO | Attending: Emergency Medicine | Admitting: Emergency Medicine

## 2012-05-19 DIAGNOSIS — M542 Cervicalgia: Secondary | ICD-10-CM

## 2012-05-19 DIAGNOSIS — Z87442 Personal history of urinary calculi: Secondary | ICD-10-CM | POA: Insufficient documentation

## 2012-05-19 DIAGNOSIS — M549 Dorsalgia, unspecified: Secondary | ICD-10-CM

## 2012-05-19 DIAGNOSIS — M1990 Unspecified osteoarthritis, unspecified site: Secondary | ICD-10-CM | POA: Insufficient documentation

## 2012-05-19 DIAGNOSIS — IMO0002 Reserved for concepts with insufficient information to code with codable children: Secondary | ICD-10-CM | POA: Insufficient documentation

## 2012-05-19 DIAGNOSIS — Y9389 Activity, other specified: Secondary | ICD-10-CM | POA: Insufficient documentation

## 2012-05-19 DIAGNOSIS — Z87891 Personal history of nicotine dependence: Secondary | ICD-10-CM | POA: Insufficient documentation

## 2012-05-19 DIAGNOSIS — S0990XA Unspecified injury of head, initial encounter: Secondary | ICD-10-CM | POA: Insufficient documentation

## 2012-05-19 DIAGNOSIS — S0993XA Unspecified injury of face, initial encounter: Secondary | ICD-10-CM | POA: Insufficient documentation

## 2012-05-19 DIAGNOSIS — Y92410 Unspecified street and highway as the place of occurrence of the external cause: Secondary | ICD-10-CM | POA: Insufficient documentation

## 2012-05-19 MED ORDER — OXYCODONE-ACETAMINOPHEN 5-325 MG PO TABS: 2.0000 | ORAL_TABLET | ORAL | Status: DC | PRN

## 2012-05-19 MED ORDER — DIAZEPAM 5 MG PO TABS
5.0000 mg | ORAL_TABLET | Freq: Once | ORAL | Status: AC
  Administered 2012-05-19: 5 mg via ORAL
  Filled 2012-05-19: qty 1

## 2012-05-19 MED ORDER — KETOROLAC TROMETHAMINE 60 MG/2ML IM SOLN
60.0000 mg | Freq: Once | INTRAMUSCULAR | Status: AC
  Administered 2012-05-19: 60 mg via INTRAMUSCULAR
  Filled 2012-05-19: qty 2

## 2012-05-19 MED ORDER — CYCLOBENZAPRINE HCL 10 MG PO TABS: 10.0000 mg | ORAL_TABLET | Freq: Two times a day (BID) | ORAL | Status: DC | PRN

## 2012-05-19 NOTE — ED Provider Notes (Signed)
History/physical exam/procedure(s) were performed by non-physician practitioner and as supervising physician I was immediately available for consultation/collaboration. I have reviewed all notes and am in agreement with care and plan.   Hilario Quarry, MD 05/19/12 [phone]
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
## 2012-06-09 ENCOUNTER — Ambulatory Visit (INDEPENDENT_AMBULATORY_CARE_PROVIDER_SITE_OTHER): Payer: BC Managed Care – PPO | Admitting: Physician Assistant

## 2012-06-09 ENCOUNTER — Encounter (INDEPENDENT_AMBULATORY_CARE_PROVIDER_SITE_OTHER): Payer: Self-pay | Admitting: Physician Assistant

## 2012-06-09 VITALS — BP 140/84 | HR 80 | Temp 97.4°F | Resp 20 | Ht 71.5 in | Wt 266.0 lb

## 2012-06-09 DIAGNOSIS — Z4651 Encounter for fitting and adjustment of gastric lap band: Secondary | ICD-10-CM

## 2012-06-09 NOTE — Progress Notes (Signed)
  HISTORY: Katherine Syme is a 37 y.o.male who received an AP-Large lap-band in October 2012 by Dr. Johna Sheriff. He has lost 20 lbs since his last visit in October. He's lost 44 lbs since surgery. He says his hunger is under excellent control and is not having any obstructive symptoms. He is attending a family reunion which requires travel, so he's requesting fluid removal.  VITAL SIGNS: Filed Vitals:   06/09/12 0839  BP: 140/84  Pulse: 80  Temp: 97.4 F (36.3 C)  Resp: 20    PHYSICAL EXAM: Physical exam reveals a very well-appearing 36 y.o.male in no apparent distress Neurologic: Awake, alert, oriented Psych: Bright affect, conversant Respiratory: Breathing even and unlabored. No stridor or wheezing Abdomen: Soft, nontender, nondistended to palpation. Incisions well-healed. No incisional hernias. Port easily palpated. Extremities: Atraumatic, good range of motion.  ASSESMENT: 37 y.o.  male  s/p AP-Large lap-band.   PLAN: The patient's port was accessed with a 20G Huber needle without difficulty. Clear fluid was aspirated and 1.5 mL saline was removed from the port to give a total predicted volume of 5 mL. The patient was advised to concentrate on healthy food choices and to avoid slider foods high in fats and carbohydrates.

## 2012-06-09 NOTE — Patient Instructions (Signed)
Return in two weeks. Focus on good food choices as well as physical activity. Return sooner if you have an increase in hunger, portion sizes or weight. Return also for difficulty swallowing, night cough, reflux.   

## 2012-06-23 ENCOUNTER — Ambulatory Visit (INDEPENDENT_AMBULATORY_CARE_PROVIDER_SITE_OTHER): Payer: BC Managed Care – PPO | Admitting: Physician Assistant

## 2012-06-23 ENCOUNTER — Encounter (INDEPENDENT_AMBULATORY_CARE_PROVIDER_SITE_OTHER): Payer: Self-pay

## 2012-06-23 VITALS — BP 120/84 | HR 70 | Temp 98.7°F | Resp 18 | Ht 71.5 in | Wt 269.8 lb

## 2012-06-23 DIAGNOSIS — Z4651 Encounter for fitting and adjustment of gastric lap band: Secondary | ICD-10-CM

## 2012-06-23 NOTE — Progress Notes (Signed)
  HISTORY: Barry Parsons is a 37 y.o.male who received an AP-Large lap-band in October 2012 by Dr. Johna Sheriff. He comes in after returning from travel for which 1.5 mL fluid was removed as a precautionary measure. He has gained just under 4 lbs and is ready to resume weight loss. He has no complaints of dysphagia.  VITAL SIGNS: Filed Vitals:   06/23/12 1340  BP: 120/84  Pulse: 70  Temp: 98.7 F (37.1 C)  Resp: 18    PHYSICAL EXAM: Physical exam reveals a very well-appearing 36 y.o.male in no apparent distress Neurologic: Awake, alert, oriented Psych: Bright affect, conversant Respiratory: Breathing even and unlabored. No stridor or wheezing Abdomen: Soft, nontender, nondistended to palpation. Incisions well-healed. No incisional hernias. Port easily palpated. Extremities: Atraumatic, good range of motion.  ASSESMENT: 37 y.o.  male  s/p AP-Large lap-band.   PLAN: The patient's port was accessed with a 20G Huber needle without difficulty. Clear fluid was aspirated and 1.5 mL saline was added to the port to give a total predicted volume of 6.5 mL. The patient was able to swallow water without difficulty following the procedure and was instructed to take clear liquids for the next 24-48 hours and advance slowly as tolerated. We'll have him return in one month or sooner if needed.

## 2012-06-23 NOTE — Patient Instructions (Signed)
Take clear liquids tonight. Thin protein shakes are ok to start tomorrow morning. Slowly advance your diet thereafter. Call us if you have persistent vomiting or regurgitation, night cough or reflux symptoms. Return as scheduled or sooner if you notice no changes in hunger/portion sizes.  

## 2012-07-21 ENCOUNTER — Ambulatory Visit (INDEPENDENT_AMBULATORY_CARE_PROVIDER_SITE_OTHER): Payer: BC Managed Care – PPO | Admitting: Physician Assistant

## 2012-07-21 ENCOUNTER — Encounter (INDEPENDENT_AMBULATORY_CARE_PROVIDER_SITE_OTHER): Payer: Self-pay

## 2012-07-21 VITALS — BP 130/82 | HR 84 | Temp 97.8°F | Resp 18 | Ht 71.5 in | Wt 261.2 lb

## 2012-07-21 DIAGNOSIS — Z4651 Encounter for fitting and adjustment of gastric lap band: Secondary | ICD-10-CM

## 2012-07-21 NOTE — Progress Notes (Signed)
  HISTORY: Oden Lindaman is a 37 y.o.male who received an AP-Large lap-band in October 2012 by Dr. Johna Sheriff. He comes in with 8 lbs weight loss since his last visit. He says he doesn't feel the level of restriction that he did prior to having fluid removed as he is able to eat bread. He denies regurgitation or reflux symptoms. He's asking for a fill today, which I think is reasonable.  VITAL SIGNS: Filed Vitals:   07/21/12 1427  BP: 130/82  Pulse: 84  Temp: 97.8 F (36.6 C)  Resp: 18    PHYSICAL EXAM: Physical exam reveals a very well-appearing 36 y.o.male in no apparent distress Neurologic: Awake, alert, oriented Psych: Bright affect, conversant Respiratory: Breathing even and unlabored. No stridor or wheezing Abdomen: Soft, nontender, nondistended to palpation. Incisions well-healed. No incisional hernias. Port easily palpated. Extremities: Atraumatic, good range of motion.  ASSESMENT: 37 y.o.  male  s/p AP-Large lap-band.   PLAN: The patient's port was accessed with a 20G Huber needle without difficulty. Clear fluid was aspirated and 0.5 mL saline was added to the port to give a total predicted volume of 7 mL. The patient was able to swallow water without difficulty following the procedure and was instructed to take clear liquids for the next 24-48 hours and advance slowly as tolerated.

## 2012-07-21 NOTE — Patient Instructions (Signed)

## 2012-08-25 ENCOUNTER — Encounter (INDEPENDENT_AMBULATORY_CARE_PROVIDER_SITE_OTHER): Payer: BC Managed Care – PPO

## 2012-09-22 ENCOUNTER — Encounter (INDEPENDENT_AMBULATORY_CARE_PROVIDER_SITE_OTHER): Payer: BC Managed Care – PPO

## 2012-10-06 ENCOUNTER — Emergency Department (HOSPITAL_COMMUNITY)
Admission: EM | Admit: 2012-10-06 | Discharge: 2012-10-06 | Disposition: A | Discharge status: Home / Self Care | Payer: BC Managed Care – PPO | Source: Home / Self Care

## 2012-10-06 ENCOUNTER — Encounter (HOSPITAL_COMMUNITY): Payer: Self-pay | Admitting: *Deleted

## 2012-10-06 ENCOUNTER — Emergency Department (INDEPENDENT_AMBULATORY_CARE_PROVIDER_SITE_OTHER): Payer: BC Managed Care – PPO

## 2012-10-06 DIAGNOSIS — S62609A Fracture of unspecified phalanx of unspecified finger, initial encounter for closed fracture: Secondary | ICD-10-CM

## 2012-10-06 MED ORDER — HYDROCODONE-ACETAMINOPHEN 5-325 MG PO TABS: 1.0000 | ORAL_TABLET | ORAL | Status: DC | PRN

## 2012-10-06 NOTE — ED Notes (Signed)
He dropped piece of wood on his fingers, while putting wood in wheelbarrow @ 1830 @ home.  C/o pain L long and ring fingers.  He applied ice and came here. States he feels his heart beating in his finger tip.  Tip appears contused and swollen and small amount of blood under the nail.

## 2012-10-06 NOTE — ED Provider Notes (Signed)
CSN: [id number]     Arrival date & time 10/06/12  1908 History   First MD Initiated Contact with Patient 10/06/12 2009     Chief Complaint  Patient presents with  . Finger Injury   (Consider location/radiation/quality/duration/timing/severity/associated sxs/prior Treatment) HPI Comments: -year-old male states that his left ring finger received a crushing injury between 2 large pieces of wood this afternoon roughly 6:30 PM. He is complaining of pain primarily to the distal phalanx. Denies other injury.   Past Medical History  Diagnosis Date  . Weight increase   . Sleep difficulties   . Arthritis   . Kidney stones    Past Surgical History  Procedure Laterality Date  . Vasectomy    . Tonsillectomy    . Laparoscopic gastric banding  11/18/10   Family History  Problem Relation Age of Onset  . Heart disease Mother    History  Substance Use Topics  . Smoking status: Current Every Day Smoker -- 0.50 packs/day    Types: Cigarettes    Last Attempt to Quit: 01/27/2008  . Smokeless tobacco: Never Used  . Alcohol Use: Yes     Comment: occasional    Review of Systems  Constitutional: Negative.   Respiratory: Negative.   Gastrointestinal: Negative.   Genitourinary: Negative.   Musculoskeletal:       As per HPI  Skin: Negative.   Neurological: Negative for dizziness, weakness, numbness and headaches.    Allergies  Review of patient's allergies indicates no known allergies.  Home Medications   Current Outpatient Rx  Name  Route  Sig  Dispense  Refill  . traMADol (ULTRAM) 50 MG tablet   Oral   Take 50 mg by mouth every 8 (eight) hours as needed for pain.          . cyclobenzaprine (FLEXERIL) 10 MG tablet   Oral   Take 1 tablet (10 mg total) by mouth 2 (two) times daily as needed for muscle spasms.   20 tablet   0   . HYDROcodone-acetaminophen (NORCO/VICODIN) 5-325 MG per tablet   Oral   Take 1 tablet by mouth every 4 (four) hours as needed for pain.   20 tablet  0   . lidocaine (LIDODERM) 5 %                BP 116/79  Pulse 78  Temp(Src) 98.2 F (36.8 C) (Oral)  Resp 18  SpO2 98% Physical Exam  Nursing note and vitals reviewed. Constitutional: He is oriented to person, place, and time. He appears well-developed and well-nourished.  HENT:  Head: Normocephalic and atraumatic.  Eyes: EOM are normal. Left eye exhibits no discharge.  Neck: Normal range of motion. Neck supple.  Musculoskeletal:  Left ring finger with mild swelling to the distal phalanx. There is ecchymosis to the flexor surface along with mild swelling. There is also a couple of small subungual hematomas. He is able to flex and extend the digit but with some pain against resistance. Sensation is intact. Skin integrity is intact without wound.  Neurological: He is alert and oriented to person, place, and time. No cranial nerve deficit.  Skin: Skin is warm and dry.  Psychiatric: He has a normal mood and affect.    ED Course  Procedures (including critical care time) Labs Review Labs Reviewed - No data to display Imaging Review Dg Hand Complete Left  10/06/2012   *RADIOLOGY REPORT*  Clinical Data: Recent traumatic injury with pain  LEFT HAND - COMPLETE 3+  VIEW  Comparison: None.  Findings: There is an undisplaced fracture of the phalangeal tuft of the fourth distal phalanx.  No other fracture is seen.  No gross soft tissue abnormality is noted.  IMPRESSION: Undisplaced fourth distal phalangeal fracture.   Original Report Authenticated By: Alcide Clever, M.D.    MDM   1. Finger fracture, closed, initial encounter      Splint ring finger in position of function. Keep it elevated, ice for the next 2-3 days. Norco 5 mg every 4 hours when necessary pain  Hayden Rasmussen, NP 10/06/12 2033

## 2012-10-06 NOTE — ED Provider Notes (Signed)
Medical screening examination/treatment/procedure(s) were performed by non-physician practitioner and as supervising physician I was immediately available for consultation/collaboration.  Leslee Home, M.D.  Reuben Likes, MD 10/06/12 2051

## 2012-11-17 ENCOUNTER — Ambulatory Visit (INDEPENDENT_AMBULATORY_CARE_PROVIDER_SITE_OTHER): Payer: BC Managed Care – PPO | Admitting: Physician Assistant

## 2012-11-17 ENCOUNTER — Encounter (INDEPENDENT_AMBULATORY_CARE_PROVIDER_SITE_OTHER): Payer: Self-pay

## 2012-11-17 VITALS — BP 127/81 | HR 66 | Temp 98.1°F | Resp 14 | Ht 71.5 in | Wt 263.2 lb

## 2012-11-17 DIAGNOSIS — Z4651 Encounter for fitting and adjustment of gastric lap band: Secondary | ICD-10-CM

## 2012-11-17 NOTE — Patient Instructions (Signed)
Return in two weeks. Focus on good food choices as well as physical activity. Return sooner if you have an increase in hunger, portion sizes or weight. Return also for difficulty swallowing, night cough, reflux.   

## 2012-11-17 NOTE — Progress Notes (Signed)
  HISTORY: Barry Parsons is a 37 y.o.male who received an AP-Large lap-band in October 2012 by Dr. Johna Sheriff. He comes in having plans to travel by air and would like some fluid removed in anticipation of travel. He is concerned about becoming over-restricted while away. Otherwise he has no complaints with the band. He denies regurgitation or reflux. He has gained a couple of pounds but he is also having some social stressors due to his work.  VITAL SIGNS: Filed Vitals:   11/17/12 0833  BP: 127/81  Pulse: 66  Temp: 98.1 F (36.7 C)  Resp: 14    PHYSICAL EXAM: Physical exam reveals a very well-appearing 37 y.o.male in no apparent distress Neurologic: Awake, alert, oriented Psych: Bright affect, conversant Respiratory: Breathing even and unlabored. No stridor or wheezing Abdomen: Soft, nontender, nondistended to palpation. Incisions well-healed. No incisional hernias. Port easily palpated. Extremities: Atraumatic, good range of motion.  ASSESMENT: 37 y.o.  male  s/p AP-Large lap-band.   PLAN: The patient's port was accessed with a 20G Huber needle without difficulty. Clear fluid was aspirated and 2 mL saline was removed from the port to give a total predicted volume of 5 mL. The patient was advised to concentrate on healthy food choices and to avoid slider foods high in fats and carbohydrates.

## 2012-12-01 ENCOUNTER — Encounter (INDEPENDENT_AMBULATORY_CARE_PROVIDER_SITE_OTHER): Payer: Self-pay

## 2012-12-01 ENCOUNTER — Ambulatory Visit (INDEPENDENT_AMBULATORY_CARE_PROVIDER_SITE_OTHER): Payer: BC Managed Care – PPO | Admitting: Physician Assistant

## 2012-12-01 VITALS — BP 126/80 | HR 92 | Temp 98.6°F | Resp 16 | Ht 71.5 in | Wt 261.6 lb

## 2012-12-01 DIAGNOSIS — Z4651 Encounter for fitting and adjustment of gastric lap band: Secondary | ICD-10-CM

## 2012-12-01 NOTE — Progress Notes (Signed)
  HISTORY: Barry Parsons is a 37 y.o.male who received an AP-Large lap-band in October 2012 by Dr. Johna Sheriff. He comes in today after returning from air travel for which fluid was removed. He's had no issues with regurgitation or reflux. He would like to get his restriction back to baseline.  VITAL SIGNS: Filed Vitals:   12/01/12 0828  BP: 126/80  Pulse: 92  Temp: 98.6 F (37 C)  Resp: 16    PHYSICAL EXAM: Physical exam reveals a very well-appearing 37 y.o.male in no apparent distress Neurologic: Awake, alert, oriented Psych: Bright affect, conversant Respiratory: Breathing even and unlabored. No stridor or wheezing Abdomen: Soft, nontender, nondistended to palpation. Incisions well-healed. No incisional hernias. Port easily palpated. Extremities: Atraumatic, good range of motion.  ASSESMENT: 37 y.o.  male  s/p AP-Large lap-band.   PLAN: The patient's port was accessed with a 20G Huber needle without difficulty. Clear fluid was aspirated and 2 mL saline was added to the port to give a total predicted volume of 5 mL. Water was difficult to tolerate with this fill volume so I removed 0.5 mL to give 4.5 mL total volume. The patient was able to swallow water without difficulty following the procedure and was instructed to take clear liquids for the next 24-48 hours and advance slowly as tolerated. He has an appointment with Dr. Johna Sheriff on 11/20 when he will likely need the remaining 0.5 mL replaced.

## 2012-12-01 NOTE — Patient Instructions (Signed)

## 2012-12-15 ENCOUNTER — Ambulatory Visit (INDEPENDENT_AMBULATORY_CARE_PROVIDER_SITE_OTHER): Payer: BC Managed Care – PPO | Admitting: General Surgery

## 2012-12-29 ENCOUNTER — Ambulatory Visit (INDEPENDENT_AMBULATORY_CARE_PROVIDER_SITE_OTHER): Payer: BC Managed Care – PPO | Admitting: General Surgery

## 2012-12-29 VITALS — BP 130/72 | HR 78 | Temp 98.0°F | Resp 18 | Ht 71.5 in | Wt 266.8 lb

## 2012-12-29 DIAGNOSIS — Z9884 Bariatric surgery status: Secondary | ICD-10-CM | POA: Insufficient documentation

## 2012-12-29 NOTE — Progress Notes (Signed)
Chief complaint: Followup lap and  History: Patient returns for followup with history of lap band placement October 2012. He had some fluid removed about 6 weeks ago as he was going to do a lot of air travel. He saw and the just under one month ago and had 1.5 cc of the 2 cc that had been removed and replaced. He still feels somewhat less restricted that he typically does. No vomiting or reflux. His weight has gone up 5 pounds. The patient was also estimated about revisional procedures. I discussed with him options for revision of lap band to gastric bypass or sleeve gastrectomy. We also discussed however that he has had quite a bit of success with his band and he is only 2 years after in his overall weight curve is still trending down. He's actually pleased with his weight loss but feels that the band requires a lot of followup. After our discussion he understands that he is really at this point not a candidate for revisional surgery.  Exam: BP 130/72  Pulse 78  Temp(Src) 98 F (36.7 C)  Resp 18  Ht 5' 11.5" (1.816 m)  Wt 266 lb 12.8 oz (121.02 kg)  BMI 36.70 kg/m2 Total weight loss 54 pounds, 5 pounds from last visit  Assessment and plan: Status post lap band with recent fluid removal for planned air travel. We went ahead with a 0.5 cc fill today to bring them up to 5 cc which has been a good volume for him in the past. We reviewed diet and exercise strategies. He will return in 3 months or sooner if he does not experience appropriate restriction with 2 days fill.

## 2013-03-15 ENCOUNTER — Encounter (INDEPENDENT_AMBULATORY_CARE_PROVIDER_SITE_OTHER): Payer: Self-pay | Admitting: General Surgery

## 2014-06-03 ENCOUNTER — Observation Stay (HOSPITAL_COMMUNITY)
Admission: EM | Admit: 2014-06-03 | Discharge: 2014-06-04 | Disposition: A | Discharge status: Home / Self Care | Payer: BLUE CROSS/BLUE SHIELD | Attending: Internal Medicine | Admitting: Internal Medicine

## 2014-06-03 ENCOUNTER — Encounter (HOSPITAL_COMMUNITY): Payer: Self-pay | Admitting: Emergency Medicine

## 2014-06-03 DIAGNOSIS — M1990 Unspecified osteoarthritis, unspecified site: Secondary | ICD-10-CM | POA: Diagnosis not present

## 2014-06-03 DIAGNOSIS — Z720 Tobacco use: Secondary | ICD-10-CM | POA: Diagnosis not present

## 2014-06-03 DIAGNOSIS — R6883 Chills (without fever): Secondary | ICD-10-CM | POA: Insufficient documentation

## 2014-06-03 DIAGNOSIS — R112 Nausea with vomiting, unspecified: Principal | ICD-10-CM | POA: Insufficient documentation

## 2014-06-03 DIAGNOSIS — Z87442 Personal history of urinary calculi: Secondary | ICD-10-CM | POA: Insufficient documentation

## 2014-06-03 DIAGNOSIS — Z8669 Personal history of other diseases of the nervous system and sense organs: Secondary | ICD-10-CM | POA: Diagnosis not present

## 2014-06-03 DIAGNOSIS — R109 Unspecified abdominal pain: Secondary | ICD-10-CM | POA: Insufficient documentation

## 2014-06-03 DIAGNOSIS — R197 Diarrhea, unspecified: Secondary | ICD-10-CM | POA: Diagnosis not present

## 2014-06-03 DIAGNOSIS — K529 Noninfective gastroenteritis and colitis, unspecified: Secondary | ICD-10-CM | POA: Diagnosis present

## 2014-06-03 LAB — CBC WITH DIFFERENTIAL/PLATELET
BASOS ABS: 0 10*3/uL (ref 0.0–0.1)
BASOS PCT: 0 % (ref 0–1)
EOS PCT: 0 % (ref 0–5)
Eosinophils Absolute: 0 10*3/uL (ref 0.0–0.7)
HEMATOCRIT: 45.8 % (ref 39.0–52.0)
Hemoglobin: 15.9 g/dL (ref 13.0–17.0)
Lymphocytes Relative: 7 % — ABNORMAL LOW (ref 12–46)
Lymphs Abs: 0.7 10*3/uL (ref 0.7–4.0)
MCH: 31.8 pg (ref 26.0–34.0)
MCHC: 34.7 g/dL (ref 30.0–36.0)
MCV: 91.6 fL (ref 78.0–100.0)
MONO ABS: 0.2 10*3/uL (ref 0.1–1.0)
Monocytes Relative: 2 % — ABNORMAL LOW (ref 3–12)
Neutro Abs: 9.6 10*3/uL — ABNORMAL HIGH (ref 1.7–7.7)
Neutrophils Relative %: 91 % — ABNORMAL HIGH (ref 43–77)
Platelets: 263 10*3/uL (ref 150–400)
RBC: 5 MIL/uL (ref 4.22–5.81)
RDW: 12.2 % (ref 11.5–15.5)
WBC: 10.5 10*3/uL (ref 4.0–10.5)

## 2014-06-03 LAB — COMPREHENSIVE METABOLIC PANEL
ALBUMIN: 4.6 g/dL (ref 3.5–5.0)
ALK PHOS: 68 U/L (ref 38–126)
ALT: 25 U/L (ref 17–63)
AST: 34 U/L (ref 15–41)
Anion gap: 9 (ref 5–15)
BILIRUBIN TOTAL: 1.2 mg/dL (ref 0.3–1.2)
BUN: 10 mg/dL (ref 6–20)
CHLORIDE: 110 mmol/L (ref 101–111)
CO2: 20 mmol/L — AB (ref 22–32)
Calcium: 9.5 mg/dL (ref 8.9–10.3)
Creatinine, Ser: 0.86 mg/dL (ref 0.61–1.24)
GFR calc Af Amer: >60 mL/min (ref >60)
Glucose, Bld: 154 mg/dL — ABNORMAL HIGH (ref 70–99)
POTASSIUM: 4.1 mmol/L (ref 3.5–5.1)
Sodium: 139 mmol/L (ref 135–145)
Total Protein: 8 g/dL (ref 6.5–8.1)

## 2014-06-03 LAB — LIPASE, BLOOD: LIPASE: 16 U/L — AB (ref 22–51)

## 2014-06-03 MED ORDER — MORPHINE SULFATE 4 MG/ML IJ SOLN
4.0000 mg | Freq: Once | INTRAMUSCULAR | Status: AC
  Administered 2014-06-03: 4 mg via INTRAVENOUS
  Filled 2014-06-03: qty 1

## 2014-06-03 MED ORDER — ONDANSETRON 4 MG PO TBDP: ORAL_TABLET | ORAL | Status: DC

## 2014-06-03 MED ORDER — ONDANSETRON HCL 4 MG/2ML IJ SOLN
4.0000 mg | Freq: Once | INTRAMUSCULAR | Status: AC
  Administered 2014-06-03: 4 mg via INTRAVENOUS
  Filled 2014-06-03: qty 2

## 2014-06-03 MED ORDER — SODIUM CHLORIDE 0.9 % IV SOLN
INTRAVENOUS | Status: AC
  Administered 2014-06-03: 23:00:00 via INTRAVENOUS

## 2014-06-03 MED ORDER — SODIUM CHLORIDE 0.9 % IV BOLUS (SEPSIS)
2000.0000 mL | Freq: Once | INTRAVENOUS | Status: AC
  Administered 2014-06-03: 2000 mL via INTRAVENOUS

## 2014-06-03 MED ORDER — HYDROCODONE-ACETAMINOPHEN 5-325 MG PO TABS: 1.0000 | ORAL_TABLET | Freq: Four times a day (QID) | ORAL | Status: DC | PRN

## 2014-06-03 MED ORDER — PROMETHAZINE HCL 25 MG RE SUPP: 25.0000 mg | Freq: Four times a day (QID) | RECTAL | Status: DC | PRN

## 2014-06-03 MED ORDER — MORPHINE SULFATE 4 MG/ML IJ SOLN
8.0000 mg | Freq: Once | INTRAMUSCULAR | Status: AC
  Administered 2014-06-03: 8 mg via INTRAVENOUS
  Filled 2014-06-03: qty 2

## 2014-06-03 MED ORDER — PROMETHAZINE HCL 25 MG/ML IJ SOLN
25.0000 mg | Freq: Once | INTRAMUSCULAR | Status: AC
Start: 2014-06-03 — End: 2014-06-03
  Administered 2014-06-03: 25 mg via INTRAVENOUS
  Filled 2014-06-03: qty 1

## 2014-06-03 NOTE — Discharge Instructions (Signed)
Abdominal Pain °Many things can cause abdominal pain. Usually, abdominal pain is not caused by a disease and will improve without treatment. It can often be observed and treated at home. Your health care provider will do a physical exam and possibly order blood tests and X-rays to help determine the seriousness of your pain. However, in many cases, more time must pass before a clear cause of the pain can be found. Before that point, your health care provider may not know if you need more testing or further treatment. °HOME CARE INSTRUCTIONS  °Monitor your abdominal pain for any changes. The following actions may help to alleviate any discomfort you are experiencing: °· Only take over-the-counter or prescription medicines as directed by your health care provider. °· Do not take laxatives unless directed to do so by your health care provider. °· Try a clear liquid diet (broth, tea, or water) as directed by your health care provider. Slowly move to a bland diet as tolerated. °SEEK MEDICAL CARE IF: °· You have unexplained abdominal pain. °· You have abdominal pain associated with nausea or diarrhea. °· You have pain when you urinate or have a bowel movement. °· You experience abdominal pain that wakes you in the night. °· You have abdominal pain that is worsened or improved by eating food. °· You have abdominal pain that is worsened with eating fatty foods. °· You have a fever. °SEEK IMMEDIATE MEDICAL CARE IF:  °· Your pain does not go away within 2 hours. °· You keep throwing up (vomiting). °· Your pain is felt only in portions of the abdomen, such as the right side or the left lower portion of the abdomen. °· You pass bloody or black tarry stools. °MAKE SURE YOU: °· Understand these instructions.   °· Will watch your condition.   °· Will get help right away if you are not doing well or get worse.   °Document Released: 10/22/2004 Document Revised: 01/17/2013 Document Reviewed: 09/21/2012 °ExitCare® Patient Information  ©2015 [employer]. This information is not intended to replace advice given to you by your health care provider. Make sure you discuss any questions you have with your health care provider. ° ° ° °Viral Gastroenteritis °Viral gastroenteritis is also known as stomach flu. This condition affects the stomach and intestinal tract. It can cause sudden diarrhea and vomiting. The illness typically lasts 3 to 8 days. Most people develop an immune response that eventually gets rid of the virus. While this natural response develops, the virus can make you quite ill. °CAUSES  °Many different viruses can cause gastroenteritis, such as rotavirus or noroviruses. You can catch one of these viruses by consuming contaminated food or water. You may also catch a virus by sharing utensils or other personal items with an infected person or by touching a contaminated surface. °SYMPTOMS  °The most common symptoms are diarrhea and vomiting. These problems can cause a severe loss of body fluids (dehydration) and a body salt (electrolyte) imbalance. Other symptoms may include: °· Fever. °· Headache. °· Fatigue. °· Abdominal pain. °DIAGNOSIS  °Your caregiver can usually diagnose viral gastroenteritis based on your symptoms and a physical exam. A stool sample may also be taken to test for the presence of viruses or other infections. °TREATMENT  °This illness typically goes away on its own. Treatments are aimed at rehydration. The most serious cases of viral gastroenteritis involve vomiting so severely that you are not able to keep fluids down. In these cases, fluids must be given through an intravenous line (  IV). °HOME CARE INSTRUCTIONS  °· Drink enough fluids to keep your urine clear or pale yellow. Drink small amounts of fluids frequently and increase the amounts as tolerated. °· Ask your caregiver for specific rehydration instructions. °· Avoid: °¨ Foods high in sugar. °¨ Alcohol. °¨ Carbonated drinks. °¨ Tobacco. °¨ Juice. °¨ Caffeine  drinks. °¨ Extremely hot or cold fluids. °¨ Fatty, greasy foods. °¨ Too much intake of anything at one time. °¨ Dairy products until 24 to 48 hours after diarrhea stops. °· You may consume probiotics. Probiotics are active cultures of beneficial bacteria. They may lessen the amount and number of diarrheal stools in adults. Probiotics can be found in yogurt with active cultures and in supplements. °· Wash your hands well to avoid spreading the virus. °· Only take over-the-counter or prescription medicines for pain, discomfort, or fever as directed by your caregiver. Do not give aspirin to children. Antidiarrheal medicines are not recommended. °· Ask your caregiver if you should continue to take your regular prescribed and over-the-counter medicines. °· Keep all follow-up appointments as directed by your caregiver. °SEEK IMMEDIATE MEDICAL CARE IF:  °· You are unable to keep fluids down. °· You do not urinate at least once every 6 to 8 hours. °· You develop shortness of breath. °· You notice blood in your stool or vomit. This may look like coffee grounds. °· You have abdominal pain that increases or is concentrated in one small area (localized). °· You have persistent vomiting or diarrhea. °· You have a fever. °· The patient is a child younger than 3 months, and he or she has a fever. °· The patient is a child older than 3 months, and he or she has a fever and persistent symptoms. °· The patient is a child older than 3 months, and he or she has a fever and symptoms suddenly get worse. °· The patient is a baby, and he or she has no tears when crying. °MAKE SURE YOU:  °· Understand these instructions. °· Will watch your condition. °· Will get help right away if you are not doing well or get worse. °Document Released: 01/12/2005 Document Revised: 04/06/2011 Document Reviewed: 10/29/2010 °ExitCare® Patient Information ©2015 [employer]. This information is not intended to replace advice given to you by your health care  provider. Make sure you discuss any questions you have with your health care provider. ° °

## 2014-06-03 NOTE — ED Notes (Signed)
Patient states throwing up ginger ale that was given after doctor left.

## 2014-06-03 NOTE — H&P (Signed)
[facility] Admission History and Physical       Barry Parsons ZOX:[id number] DOB: Jun 07, 1975 DOA: 06/03/2014  Referring physician: EDP PCP: Pearson Grippe, MD  Specialists:   Chief Complaint: Nausea, Vomiting and Diarrhea   HPI: Barry Parsons is a 39 y.o. male who presents to the ED with complaints of Nausea Vomiting and Diarrhea with diffuse ABD Pain since the AM.   He reports subjective fever and chills.  The last food he had was for dinner with night before which were chicken nuggets from [facility].    No one in the home is sick .       Review of Systems: Constitutional: No Weight Loss, No Weight Gain, Night Sweats, Fevers, Chills, Dizziness, Light Headedness, Fatigue, or Generalized Weakness HEENT: No Headaches, Difficulty Swallowing,Tooth/Dental Problems,Sore Throat,  No Sneezing, Rhinitis, Ear Ache, Nasal Congestion, or Post Nasal Drip,  Cardio-vascular:  No Chest pain, Orthopnea, PND, Edema in Lower Extremities, Anasarca, Dizziness, Palpitations  Resp: No Dyspnea, No DOE, No Productive Cough, No Non-Productive Cough, No Hemoptysis, No Wheezing.    GI: No Heartburn, Indigestion, +Abdominal Pain, +Nausea,+Vomiting, +Diarrhea, Constipation, Hematemesis, Hematochezia, Melena, Change in Bowel Habits,  Loss of Appetite  GU: No Dysuria, No Change in Color of Urine, No Urgency or Urinary Frequency, No Flank pain.  Musculoskeletal: No Joint Pain or Swelling, No Decreased Range of Motion, No Back Pain.  Neurologic: No Syncope, No Seizures, Muscle Weakness, Paresthesia, Vision Disturbance or Loss, No Diplopia, No Vertigo, No Difficulty Walking,  Skin: No Rash or Lesions. Psych: No Change in Mood or Affect, No Depression or Anxiety, No Memory loss, No Confusion, or Hallucinations   Past Medical History  Diagnosis Date  . Weight increase   . Sleep difficulties   . Arthritis   . Kidney stones      Past Surgical History  Procedure Laterality Date  . Vasectomy  2009  .  Tonsillectomy  2011  . 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 Tonsillectomy        Prior to Admission medications   Medication Sig Start Date End Date Taking? Authorizing Provider  lidocaine (LIDODERM) 5 % Place 1 patch onto the skin daily.  05/24/12  Yes Historical Provider, MD  traMADol (ULTRAM) 50 MG tablet Take 50 mg by mouth every 8 (eight) hours as needed for pain.  07/03/10  Yes Historical Provider, MD  traZODone (DESYREL) 50 MG tablet Take 1 tablet by mouth at bedtime as needed for sleep.  04/30/14  Yes Historical Provider, MD  cyclobenzaprine (FLEXERIL) 10 MG tablet Take 1 tablet (10 mg total) by mouth 2 (two) times daily as needed for muscle spasms. Patient not taking: Reported on 06/03/2014 05/19/12   Emilia Beck, PA-C  HYDROcodone-acetaminophen (NORCO) 5-325 MG per tablet Take 1 tablet by mouth every 6 (six) hours as needed for severe pain. 06/03/14   Pricilla Loveless, MD  ondansetron (ZOFRAN ODT) 4 MG disintegrating tablet  ODT q4 hours prn nausea/vomiting 06/03/14   Pricilla Loveless, MD  promethazine (PHENERGAN) 25 MG suppository Place 1 suppository (25 mg total) rectally every 6 (six) hours as needed for nausea or vomiting. 06/03/14   Pricilla Loveless, MD     No Known Allergies  Social History: Married, Able to perform all ADLs  reports that he has been smoking Cigarettes.  He has been smoking about 0.25 packs per day. He has never used smokeless tobacco. He reports that he drinks alcohol. He reports that he does not use illicit drugs.    Family  History  Problem Relation Age of Onset  . Heart disease Mother        Physical Exam:  GEN:  Pleasant Obese  39 y.o. Caucasian male examined and in no acute distress; cooperative with exam Filed Vitals:   06/03/14 1733 06/03/14 1941 06/03/14 2207  BP: 149/79 140/62 128/59  Pulse: 60 66 88  Temp: 99.7 F (37.6 C) 98.5 F (36.9 C)   TempSrc: Oral Oral   Resp: 20 18 16   Height: 5\' 11"  (1.803 m)    Weight: 127.007 kg (280 lb)      SpO2: 98% 96% 94%   Blood pressure 128/59, pulse 88, temperature 98.5 F (36.9 C), temperature source Oral, resp. rate 16, height 5\' 11"  (1.803 m), weight 127.007 kg (280 lb), SpO2 94 %. PSYCH: He is alert and oriented x4; does not appear anxious does not appear depressed; affect is normal HEENT: Normocephalic and Atraumatic, Mucous membranes pink; PERRLA; EOM intact; Fundi:  Benign;  No scleral icterus, Nares: Patent, Oropharynx: Clear, Fair Dentition,    Neck:  FROM, No Cervical Lymphadenopathy nor Thyromegaly or Carotid Bruit; No JVD; Breasts:: Not examined CHEST WALL: No tenderness CHEST: Normal respiration, clear to auscultation bilaterally HEART: Regular rate and rhythm; no murmurs rubs or gallops BACK: No kyphosis or scoliosis; No CVA tenderness ABDOMEN: Positive Bowel Sounds,  Obese, Soft Non-Tender, No Rebound or Guarding; No Masses, No Organomegaly. Rectal Exam: Not done EXTREMITIES: No Cyanosis, Clubbing, or Edema; No Ulcerations. Genitalia: not examined PULSES: 2+ and symmetric SKIN: Normal hydration no rash or ulceration CNS:  Alert and Oriented x 4, No Focal Deficits Vascular: pulses palpable throughout    Labs on Admission:  Basic Metabolic Panel:  Recent Labs Lab 06/03/14 1820  NA 139  K 4.1  CL 110  CO2 20*  GLUCOSE 154*  BUN 10  CREATININE 0.86  CALCIUM 9.5   Liver Function Tests:  Recent Labs Lab 06/03/14 1820  AST 34  ALT 25  ALKPHOS 68  BILITOT 1.2  PROT 8.0  ALBUMIN 4.6    Recent Labs Lab 06/03/14 1820  LIPASE 16*   No results for input(s): AMMONIA in the last 168 hours. CBC:  Recent Labs Lab 06/03/14 1820  WBC 10.5  NEUTROABS 9.6*  HGB 15.9  HCT 45.8  MCV 91.6  PLT 263   Cardiac Enzymes: No results for input(s): CKTOTAL, CKMB, CKMBINDEX, TROPONINI in the last 168 hours.  BNP (last 3 results) No results for input(s): BNP in the last 8760 hours.  ProBNP (last 3 results) No results for input(s): PROBNP in the last 8760  hours.  CBG: No results for input(s): GLUCAP in the last 168 hours.  Radiological Exams on Admission: No results found.   EKG: Independently reviewed.    Assessment/Plan:   39 y.o. male with  Principal Problem:   1.   Acute gastroenteritis   IVFs   Anti-Emetics PRN   Stool for Culture   Lomotil PRN   Clear Liquid Diet Advance as Tolerated          2.   DVT Prophylaxis   Lovenox          Code Status:     FULL CODE       Family Communication:   Wife and Son at Bedside    Disposition Plan:     Observation Status        Time spent:  460 70 Minutes      Ron ParkerJENKINS,Yakima Kreitzer C [facility] Pager [phone]  If 7AM -7PM Please Contact the Day Rounding Team MD for [facility]  If 7PM-7AM, Please Contact Night-Floor Coverage  [email or web address] Password [email or web address] 06/03/2014, 11:12 PM     ADDENDUM:   Patient was seen and examined on 06/03/2014

## 2014-06-03 NOTE — ED Notes (Signed)
Pt woke up this morning with bad nausea, about 30 minutes later started "violently vomiting." Pt also c/o diarrhea onset today. Pt states he has had diarrhea and emesis about twice an hour for the past 12 hours, unable to keep anything down all day.

## 2014-06-03 NOTE — ED Provider Notes (Signed)
CSN: [id number]     Arrival date & time 06/03/14  1726 History   First MD Initiated Contact with Patient 06/03/14 1734     Chief Complaint  Patient presents with  . Emesis  . Diarrhea     (Consider location/radiation/quality/duration/timing/severity/associated sxs/prior Treatment) HPI  39 year old male presents with nausea, vomiting, and diarrhea for the past 8 hours. Last night he states he had [employer] for dinner but did not note any foul tasting or smelling food. Went to bed normally and then when he woke up he is been having vomiting and diarrhea all day. No hematemesis, hematochezia, or melena. His stools are loose and watery. No recent sick contacts. He has not had any fevers although he has had hot and cold spells all day. No recent antibiotics or travel. The patient complains of diffuse abdominal cramping, worse prior to diarrhea or vomiting. Has been unable to keep down any fluids.  Past Medical History  Diagnosis Date  . Weight increase   . Sleep difficulties   . Arthritis   . Kidney stones    Past Surgical History  Procedure Laterality Date  . Vasectomy  2009  . Tonsillectomy  2011  . 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 Tonsillectomy     Family History  Problem Relation Age of Onset  . Heart disease Mother    History  Substance Use Topics  . Smoking status: Current Every Day Smoker -- 0.25 packs/day    Types: Cigarettes    Last Attempt to Quit: 01/27/2008  . Smokeless tobacco: Never Used  . Alcohol Use: Yes     Comment: occasional    Review of Systems  Constitutional: Positive for chills. Negative for fever.  Gastrointestinal: Positive for nausea, vomiting, abdominal pain and diarrhea. Negative for blood in stool.  Genitourinary: Negative for dysuria.  All other systems reviewed and are negative.     Allergies  Review of patient's allergies indicates no known allergies.  Home Medications   Prior to Admission medications   Medication Sig Start  Date End Date Taking? Authorizing Provider  cyclobenzaprine (FLEXERIL) 10 MG tablet Take 1 tablet (10 mg total) by mouth 2 (two) times daily as needed for muscle spasms. 05/19/12   Emilia BeckKaitlyn Szekalski, PA-C  HYDROcodone-acetaminophen (NORCO/VICODIN) 5-325 MG per tablet Take 1 tablet by mouth every 4 (four) hours as needed for pain. 10/06/12   Hayden Rasmussenavid Mabe, NP  lidocaine (LIDODERM) 5 %  05/24/12   Historical Provider, MD  traMADol (ULTRAM) 50 MG tablet Take 50 mg by mouth every 8 (eight) hours as needed for pain.  07/03/10   Historical Provider, MD   BP 149/79 mmHg  Pulse 60  Temp(Src) 99.7 F (37.6 C) (Oral)  Resp 20  Ht 5\' 11"  (1.803 m)  Wt 280 lb (127.007 kg)  BMI 39.07 kg/m2  SpO2 98% Physical Exam  Constitutional: He is oriented to person, place, and time. He appears well-developed and well-nourished.  HENT:  Head: Normocephalic and atraumatic.  Right Ear: External ear normal.  Left Ear: External ear normal.  Nose: Nose normal.  Mouth/Throat: Mucous membranes are dry.  Eyes: Right eye exhibits no discharge. Left eye exhibits no discharge.  Neck: Neck supple.  Cardiovascular: Normal rate, regular rhythm, normal heart sounds and intact distal pulses.   Pulmonary/Chest: Effort normal.  Abdominal: Soft. He exhibits no distension. There is no tenderness.  Musculoskeletal: He exhibits no edema.  Neurological: He is alert and oriented to person, place, and time.  Skin: Skin is warm  and dry.  Nursing note and vitals reviewed.   ED Course  Procedures (including critical care time) Labs Review Labs Reviewed  COMPREHENSIVE METABOLIC PANEL - Abnormal; Notable for the following:    CO2 20 (*)    Glucose, Bld 154 (*)    All other components within normal limits  CBC WITH DIFFERENTIAL/PLATELET - Abnormal; Notable for the following:    Neutrophils Relative % 91 (*)    Neutro Abs 9.6 (*)    Lymphocytes Relative 7 (*)    Monocytes Relative 2 (*)    All other components within normal limits   LIPASE, BLOOD - Abnormal; Notable for the following:    Lipase 16 (*)    All other components within normal limits    Imaging Review No results found.   EKG Interpretation None      MDM   Final diagnoses:  Nausea vomiting and diarrhea  Abdominal pain, unspecified abdominal location    Patient with intractable nausea and vomiting. Given multiple doses of Zofran and then Phenergan but still unable to keep down fluids. Bicarbonate is mildly low indicating some dehydration likely acidosis from the diarrhea and vomiting. Vital signs are stable. No significant abdominal tenderness to suggest needing a CT imaging. This seems to be a viral gastroenteritis but given he is unable to keep fluids down he will be admitted to the hospitalist under observation.    Pricilla LovelessScott Meoshia Billing, MD 06/03/14 [id number]

## 2014-06-03 NOTE — ED Notes (Signed)
Patient has thrown up two more times since.

## 2014-06-04 DIAGNOSIS — R197 Diarrhea, unspecified: Secondary | ICD-10-CM

## 2014-06-04 DIAGNOSIS — R109 Unspecified abdominal pain: Secondary | ICD-10-CM | POA: Insufficient documentation

## 2014-06-04 DIAGNOSIS — R112 Nausea with vomiting, unspecified: Secondary | ICD-10-CM | POA: Insufficient documentation

## 2014-06-04 LAB — BASIC METABOLIC PANEL
ANION GAP: 8 (ref 5–15)
BUN: 8 mg/dL (ref 6–20)
CO2: 22 mmol/L (ref 22–32)
Calcium: 8.3 mg/dL — ABNORMAL LOW (ref 8.9–10.3)
Chloride: 111 mmol/L (ref 101–111)
Creatinine, Ser: 0.77 mg/dL (ref 0.61–1.24)
GFR calc non Af Amer: >60 mL/min (ref >60)
GLUCOSE: 114 mg/dL — AB (ref 70–99)
POTASSIUM: 3.9 mmol/L (ref 3.5–5.1)
Sodium: 141 mmol/L (ref 135–145)

## 2014-06-04 LAB — CBC
HEMATOCRIT: 42.8 % (ref 39.0–52.0)
Hemoglobin: 14.5 g/dL (ref 13.0–17.0)
MCH: 31.7 pg (ref 26.0–34.0)
MCHC: 33.9 g/dL (ref 30.0–36.0)
MCV: 93.7 fL (ref 78.0–100.0)
Platelets: 224 10*3/uL (ref 150–400)
RBC: 4.57 MIL/uL (ref 4.22–5.81)
RDW: 12.4 % (ref 11.5–15.5)
WBC: 11.1 10*3/uL — ABNORMAL HIGH (ref 4.0–10.5)

## 2014-06-04 MED ORDER — ALUM & MAG HYDROXIDE-SIMETH 200-200-20 MG/5ML PO SUSP: 30.0000 mL | Freq: Four times a day (QID) | ORAL | Status: DC | PRN

## 2014-06-04 MED ORDER — ONDANSETRON HCL 4 MG PO TABS: 4.0000 mg | ORAL_TABLET | Freq: Four times a day (QID) | ORAL | Status: DC | PRN

## 2014-06-04 MED ORDER — OXYCODONE HCL 5 MG PO TABS: 5.0000 mg | ORAL_TABLET | ORAL | Status: DC | PRN

## 2014-06-04 MED ORDER — ENOXAPARIN SODIUM 40 MG/0.4ML ~~LOC~~ SOLN: 40.0000 mg | SUBCUTANEOUS | Status: DC

## 2014-06-04 MED ORDER — ACETAMINOPHEN 650 MG RE SUPP: 650.0000 mg | Freq: Four times a day (QID) | RECTAL | Status: DC | PRN

## 2014-06-04 MED ORDER — SODIUM CHLORIDE 0.9 % IV SOLN
INTRAVENOUS | Status: DC
  Administered 2014-06-04 (×2): via INTRAVENOUS

## 2014-06-04 MED ORDER — ENOXAPARIN SODIUM 60 MG/0.6ML ~~LOC~~ SOLN
60.0000 mg | Freq: Every day | SUBCUTANEOUS | Status: DC
  Filled 2014-06-04: qty 0.6

## 2014-06-04 MED ORDER — ACETAMINOPHEN 325 MG PO TABS: 650.0000 mg | ORAL_TABLET | Freq: Four times a day (QID) | ORAL | Status: DC | PRN

## 2014-06-04 MED ORDER — HYDROMORPHONE HCL 1 MG/ML IJ SOLN
0.5000 mg | INTRAMUSCULAR | Status: DC | PRN
  Administered 2014-06-04: 1 mg via INTRAVENOUS
  Filled 2014-06-04: qty 1

## 2014-06-04 MED ORDER — ONDANSETRON HCL 4 MG/2ML IJ SOLN
4.0000 mg | Freq: Four times a day (QID) | INTRAMUSCULAR | Status: DC | PRN
  Administered 2014-06-04: 4 mg via INTRAVENOUS
  Filled 2014-06-04: qty 2

## 2014-06-04 NOTE — Discharge Summary (Signed)
Physician Discharge Summary  Barry Parsons ZOX:[id number] DOB: 1975/01/27 DOA: 06/03/2014  PCP: Pearson GrippeKIM, JAMES, MD  Admit date: 06/03/2014 Discharge date: 06/04/2014  Time spent: 60 minutes  Recommendations for Outpatient Follow-up:  1. Follow-up with Pearson GrippeKIM, JAMES, MD in 1-2 weeks. A follow-up patient will need a basic metabolic profile done to follow-up on electrolytes and renal function.  Discharge Diagnoses:  Principal Problem:   Acute gastroenteritis Active Problems:   AP (abdominal pain)   Nausea vomiting and diarrhea   Discharge Condition: Stable and improved  Diet recommendation: Regular  Filed Weights   06/03/14 1733 06/04/14 0036  Weight: 127.007 kg (280 lb) 132.5 kg (292 lb 1.8 oz)    History of present illness:  Barry LevelKevin Schrager is a 39 y.o. male who presented to the ED with complaints of Nausea Vomiting and Diarrhea with diffuse ABD Pain since the AM. He reported subjective fever and chills. The last food he had was for dinner with night before which [facility]. No one in the home is sick   Hospital Course:  #1. Acute gastroenteritis Patient at present nausea vomiting diarrhea diffuse abdominal pain felt to be secondary to an acute gastroenteritis. Patient was admitted. Patient was placed on IV fluids, antiemetics and started on a clear liquid diet. Patient improved clinically and was subsequently advanced to a full liquid diet which she tolerated. Patient did not have any further nausea vomiting or abdominal pain. Patient did not have any further diarrhea. Patient be discharged home in stable and improved condition to follow-up with PCP as outpatient.  #2 nausea/ vomiting/ diarrhea Secondary to problem #1. See problem #1.  The rest of patient's chronic medical issues remained stable throughout as well as angina patient discharged in stable and improved condition.    Procedures:  None  Consultations:  None  Discharge Exam: Filed Vitals:    06/04/14 1343  BP: 127/69  Pulse: 78  Temp: 98.6 F (37 C)  Resp: 16    General: NAD Cardiovascular: RRR Respiratory: CTAB  Discharge Instructions   Discharge Instructions    Diet general    Complete by:  As directed      Discharge instructions    Complete by:  As directed   Follow up with Pearson GrippeKIM, JAMES, MD in 1-2 week     Increase activity slowly    Complete by:  As directed           Current Discharge Medication List    START taking these medications   Details  ondansetron (ZOFRAN ODT) 4 MG disintegrating tablet 4mg  ODT q4 hours prn nausea/vomiting Qty: 15 tablet, Refills: 0    promethazine (PHENERGAN) 25 MG suppository Place 1 suppository (25 mg total) rectally every 6 (six) hours as needed for nausea or vomiting. Qty: 10 each, Refills: 0      CONTINUE these medications which have CHANGED   Details  HYDROcodone-acetaminophen (NORCO) 5-325 MG per tablet Take 1 tablet by mouth every 6 (six) hours as needed for severe pain. Qty: 20 tablet, Refills: 0      CONTINUE these medications which have NOT CHANGED   Details  lidocaine (LIDODERM) 5 % Place 1 patch onto the skin daily.     traMADol (ULTRAM) 50 MG tablet Take 50 mg by mouth every 8 (eight) hours as needed for pain.     traZODone (DESYREL) 50 MG tablet Take 1 tablet by mouth at bedtime as needed for sleep.       STOP taking these medications  cyclobenzaprine (FLEXERIL) 10 MG tablet        No Known Allergies Follow-up Information    Follow up with Pearson GrippeKIM, JAMES, MD In 3 days.   Specialty:  Internal Medicine   Why:  if not better   Contact information:   [address] [place] [address] [phone]       Follow up with [facility]EM[facility]ENCY [facility].   Specialty:  Emergency Medicine   Why:  If symptoms worsen   Contact information:   [address] [id number] [place] [id number] [phone]      Follow up with Pearson GrippeKIM, JAMES, MD.  Schedule an appointment as soon as possible for a visit in 1 week.   Specialty:  Internal Medicine   Why:  f/u in 1-2 weeks    Contact information:   [address] [place] [address] [phone]        The results of significant diagnostics from this hospitalization (including imaging, microbiology, ancillary and laboratory) are listed below for reference.    Significant Diagnostic Studies: No results found.  Microbiology: No results found for this or any previous visit (from the past 240 hour(s)).   Labs: Basic Metabolic Panel:  Recent Labs Lab 06/03/14 1820 06/04/14 0418  NA 139 141  K 4.1 3.9  CL 110 111  CO2 20* 22  GLUCOSE 154* 114*  BUN 10 8  CREATININE 0.86 0.77  CALCIUM 9.5 8.3*   Liver Function Tests:  Recent Labs Lab 06/03/14 1820  AST 34  ALT 25  ALKPHOS 68  BILITOT 1.2  PROT 8.0  ALBUMIN 4.6    Recent Labs Lab 06/03/14 1820  LIPASE 16*   No results for input(s): AMMONIA in the last 168 hours. CBC:  Recent Labs Lab 06/03/14 1820 06/04/14 0418  WBC 10.5 11.1*  NEUTROABS 9.6*  --   HGB 15.9 14.5  HCT 45.8 42.8  MCV 91.6 93.7  PLT 263 224   Cardiac Enzymes: No results for input(s): CKTOTAL, CKMB, CKMBINDEX, TROPONINI in the last 168 hours. BNP: BNP (last 3 results) No results for input(s): BNP in the last 8760 hours.  ProBNP (last 3 results) No results for input(s): PROBNP in the last 8760 hours.  CBG: No results for input(s): GLUCAP in the last 168 hours.     SignedRamiro Harvest:  Adarsh Mundorf MD [facility] 06/04/2014, 3:26 PM

## 2014-06-04 NOTE — ED Notes (Signed)
Not able to give report, answering nurse states it will not be her pt, receiving nurse is on the phone giving report on another pt.

## 2014-06-04 NOTE — ED Notes (Signed)
Unable to give report no answer from the floor

## 2014-06-04 NOTE — ED Notes (Signed)
Not able to give report, NT answered phone stating that all the nurses were in the room.

## 2014-06-04 NOTE — Progress Notes (Signed)
Nursing Discharge Summary  Patient ID: Barry LevelKevin Parsons MRN: [id number] DOB/AGE: 1975-07-26 39 y.o.  Admit date: 06/03/2014 Discharge date: 06/04/2014  Discharged Condition: good  Disposition: 01-Home or Self Care  Follow-up Information    Follow up with KENDALL,LONNIE, NP On 06/13/2014.   Specialty:  Internal Medicine   Why:  11:00 am   Contact information:   [address] [place] [address] [phone]       Prescriptions Given: None given. Follow up appointments and medications discussed. Patient verbalized understanding without further questions  Means of Discharge: patient ambulated downstairs to be discharged home.   Signed: Gloriajean DellBaldwin, Janal Haak Danielle 06/04/2014, 5:04 PM

## 2014-06-04 NOTE — Progress Notes (Signed)
Rx Brief Lovenox note  Pt with wt=132 kg, BMI=40.7, CrCl~158 ml/min  Lovenox was adjusted to 60mg  (~0.5 mg/kg) daily in pt with BMI>30  Thanks Lorenza EvangelistGreen, Kodi Steil R 06/04/2014 1:43 AM

## 2014-06-04 NOTE — ED Notes (Signed)
No answer from the floor to give report, I have notified my charge nurse.

## 2014-06-04 NOTE — Progress Notes (Signed)
Patient arrived on the unit at approximately 0035 this morning. He is alert and verbally responsive. Does c/o nausea but no vomiting noted at this time. Will continue to monitor.

## 2014-06-05 ENCOUNTER — Other Ambulatory Visit (HOSPITAL_COMMUNITY): Payer: Self-pay

## 2014-06-05 ENCOUNTER — Emergency Department (HOSPITAL_COMMUNITY): Payer: BLUE CROSS/BLUE SHIELD

## 2014-06-05 ENCOUNTER — Inpatient Hospital Stay (HOSPITAL_COMMUNITY)
Admission: EM | Admit: 2014-06-05 | Discharge: 2014-06-07 | DRG: 392 | Disposition: A | Discharge status: Home / Self Care | Payer: BLUE CROSS/BLUE SHIELD | Attending: Internal Medicine | Admitting: Internal Medicine

## 2014-06-05 ENCOUNTER — Encounter (HOSPITAL_COMMUNITY): Payer: Self-pay | Admitting: *Deleted

## 2014-06-05 DIAGNOSIS — R112 Nausea with vomiting, unspecified: Secondary | ICD-10-CM | POA: Diagnosis present

## 2014-06-05 DIAGNOSIS — K3184 Gastroparesis: Secondary | ICD-10-CM

## 2014-06-05 DIAGNOSIS — Z8249 Family history of ischemic heart disease and other diseases of the circulatory system: Secondary | ICD-10-CM

## 2014-06-05 DIAGNOSIS — E669 Obesity, unspecified: Secondary | ICD-10-CM | POA: Diagnosis present

## 2014-06-05 DIAGNOSIS — A084 Viral intestinal infection, unspecified: Principal | ICD-10-CM | POA: Diagnosis present

## 2014-06-05 DIAGNOSIS — Z6839 Body mass index (BMI) 39.0-39.9, adult: Secondary | ICD-10-CM | POA: Diagnosis not present

## 2014-06-05 DIAGNOSIS — E876 Hypokalemia: Secondary | ICD-10-CM | POA: Diagnosis present

## 2014-06-05 DIAGNOSIS — K529 Noninfective gastroenteritis and colitis, unspecified: Secondary | ICD-10-CM | POA: Diagnosis not present

## 2014-06-05 DIAGNOSIS — Z9884 Bariatric surgery status: Secondary | ICD-10-CM | POA: Diagnosis not present

## 2014-06-05 DIAGNOSIS — E872 Acidosis, unspecified: Secondary | ICD-10-CM | POA: Insufficient documentation

## 2014-06-05 DIAGNOSIS — R197 Diarrhea, unspecified: Secondary | ICD-10-CM | POA: Diagnosis not present

## 2014-06-05 DIAGNOSIS — N50812 Left testicular pain: Secondary | ICD-10-CM

## 2014-06-05 DIAGNOSIS — R1032 Left lower quadrant pain: Secondary | ICD-10-CM | POA: Diagnosis not present

## 2014-06-05 DIAGNOSIS — Z79899 Other long term (current) drug therapy: Secondary | ICD-10-CM | POA: Diagnosis not present

## 2014-06-05 DIAGNOSIS — IMO0002 Reserved for concepts with insufficient information to code with codable children: Secondary | ICD-10-CM

## 2014-06-05 DIAGNOSIS — F17210 Nicotine dependence, cigarettes, uncomplicated: Secondary | ICD-10-CM | POA: Diagnosis present

## 2014-06-05 DIAGNOSIS — Z87442 Personal history of urinary calculi: Secondary | ICD-10-CM | POA: Diagnosis not present

## 2014-06-05 DIAGNOSIS — R1110 Vomiting, unspecified: Secondary | ICD-10-CM

## 2014-06-05 DIAGNOSIS — N508 Other specified disorders of male genital organs: Secondary | ICD-10-CM | POA: Diagnosis not present

## 2014-06-05 DIAGNOSIS — R109 Unspecified abdominal pain: Secondary | ICD-10-CM | POA: Diagnosis present

## 2014-06-05 LAB — COMPREHENSIVE METABOLIC PANEL
ALK PHOS: 62 U/L (ref 38–126)
ALT: 25 U/L (ref 17–63)
AST: 27 U/L (ref 15–41)
Albumin: 4.2 g/dL (ref 3.5–5.0)
Anion gap: 14 (ref 5–15)
BUN: 11 mg/dL (ref 6–20)
CO2: 17 mmol/L — AB (ref 22–32)
Calcium: 8.9 mg/dL (ref 8.9–10.3)
Chloride: 108 mmol/L (ref 101–111)
Creatinine, Ser: 0.85 mg/dL (ref 0.61–1.24)
GLUCOSE: 104 mg/dL — AB (ref 70–99)
POTASSIUM: 3.5 mmol/L (ref 3.5–5.1)
SODIUM: 139 mmol/L (ref 135–145)
TOTAL PROTEIN: 7.4 g/dL (ref 6.5–8.1)
Total Bilirubin: 1.1 mg/dL (ref 0.3–1.2)

## 2014-06-05 LAB — URINALYSIS, ROUTINE W REFLEX MICROSCOPIC
GLUCOSE, UA: NEGATIVE mg/dL
HGB URINE DIPSTICK: NEGATIVE
Ketones, ur: >80 mg/dL — AB
NITRITE: NEGATIVE
PROTEIN: NEGATIVE mg/dL
Specific Gravity, Urine: 1.028 (ref 1.005–1.030)
UROBILINOGEN UA: 1 mg/dL (ref 0.0–1.0)
pH: 5.5 (ref 5.0–8.0)

## 2014-06-05 LAB — URINE MICROSCOPIC-ADD ON

## 2014-06-05 LAB — CBC
HEMATOCRIT: 45.2 % (ref 39.0–52.0)
HEMOGLOBIN: 15.4 g/dL (ref 13.0–17.0)
MCH: 31.4 pg (ref 26.0–34.0)
MCHC: 34.1 g/dL (ref 30.0–36.0)
MCV: 92.1 fL (ref 78.0–100.0)
Platelets: 235 10*3/uL (ref 150–400)
RBC: 4.91 MIL/uL (ref 4.22–5.81)
RDW: 12.2 % (ref 11.5–15.5)
WBC: 9.1 10*3/uL (ref 4.0–10.5)

## 2014-06-05 LAB — LIPASE, BLOOD: LIPASE: 14 U/L — AB (ref 22–51)

## 2014-06-05 MED ORDER — HYOSCYAMINE SULFATE 0.5 MG/ML IJ SOLN
0.1250 mg | Freq: Once | INTRAMUSCULAR | Status: AC
  Administered 2014-06-05: 0.125 mg via INTRAVENOUS
  Filled 2014-06-05: qty 0.25

## 2014-06-05 MED ORDER — HYDROMORPHONE HCL 1 MG/ML IJ SOLN
1.0000 mg | Freq: Once | INTRAMUSCULAR | Status: AC
  Administered 2014-06-05: 1 mg via INTRAVENOUS
  Filled 2014-06-05: qty 1

## 2014-06-05 MED ORDER — ENOXAPARIN SODIUM 40 MG/0.4ML ~~LOC~~ SOLN: 40.0000 mg | SUBCUTANEOUS | Status: DC

## 2014-06-05 MED ORDER — IOHEXOL 300 MG/ML  SOLN
100.0000 mL | Freq: Once | INTRAMUSCULAR | Status: AC | PRN
  Administered 2014-06-05: 100 mL via INTRAVENOUS

## 2014-06-05 MED ORDER — FENTANYL CITRATE (PF) 100 MCG/2ML IJ SOLN
50.0000 ug | Freq: Once | INTRAMUSCULAR | Status: AC
  Administered 2014-06-05: 50 ug via INTRAVENOUS
  Filled 2014-06-05: qty 2

## 2014-06-05 MED ORDER — LORAZEPAM 2 MG/ML IJ SOLN
1.0000 mg | Freq: Once | INTRAMUSCULAR | Status: AC
  Administered 2014-06-05: 1 mg via INTRAVENOUS
  Filled 2014-06-05: qty 1

## 2014-06-05 MED ORDER — PANTOPRAZOLE SODIUM 40 MG IV SOLR
40.0000 mg | Freq: Two times a day (BID) | INTRAVENOUS | Status: DC
  Administered 2014-06-05 – 2014-06-07 (×5): 40 mg via INTRAVENOUS
  Filled 2014-06-05 (×6): qty 40

## 2014-06-05 MED ORDER — KETOROLAC TROMETHAMINE 30 MG/ML IJ SOLN
30.0000 mg | Freq: Once | INTRAMUSCULAR | Status: AC
  Administered 2014-06-05: 30 mg via INTRAVENOUS
  Filled 2014-06-05: qty 1

## 2014-06-05 MED ORDER — ENOXAPARIN SODIUM 80 MG/0.8ML ~~LOC~~ SOLN
0.5000 mg/kg | SUBCUTANEOUS | Status: DC
  Administered 2014-06-05: 65 mg via SUBCUTANEOUS
  Filled 2014-06-05 (×3): qty 0.8

## 2014-06-05 MED ORDER — HYDROMORPHONE HCL 1 MG/ML IJ SOLN
1.0000 mg | INTRAMUSCULAR | Status: DC | PRN
  Administered 2014-06-05 – 2014-06-06 (×3): 1 mg via INTRAVENOUS
  Filled 2014-06-05 (×3): qty 1

## 2014-06-05 MED ORDER — KCL IN DEXTROSE-NACL 20-5-0.45 MEQ/L-%-% IV SOLN
INTRAVENOUS | Status: DC
  Administered 2014-06-05 – 2014-06-07 (×5): via INTRAVENOUS
  Filled 2014-06-05 (×8): qty 1000

## 2014-06-05 MED ORDER — ONDANSETRON HCL 4 MG/2ML IJ SOLN
4.0000 mg | Freq: Four times a day (QID) | INTRAMUSCULAR | Status: DC
  Administered 2014-06-05 – 2014-06-07 (×9): 4 mg via INTRAVENOUS
  Filled 2014-06-05 (×9): qty 2

## 2014-06-05 MED ORDER — METOCLOPRAMIDE HCL 5 MG/ML IJ SOLN
10.0000 mg | Freq: Four times a day (QID) | INTRAMUSCULAR | Status: DC
  Administered 2014-06-05 – 2014-06-07 (×9): 10 mg via INTRAVENOUS
  Filled 2014-06-05 (×12): qty 2

## 2014-06-05 MED ORDER — PROMETHAZINE HCL 25 MG/ML IJ SOLN
12.5000 mg | Freq: Once | INTRAMUSCULAR | Status: AC
  Administered 2014-06-05: 12.5 mg via INTRAVENOUS
  Filled 2014-06-05: qty 1

## 2014-06-05 MED ORDER — PROMETHAZINE HCL 25 MG/ML IJ SOLN
12.5000 mg | Freq: Four times a day (QID) | INTRAMUSCULAR | Status: DC | PRN
  Filled 2014-06-05: qty 1

## 2014-06-05 MED ORDER — ONDANSETRON HCL 4 MG/2ML IJ SOLN
4.0000 mg | Freq: Once | INTRAMUSCULAR | Status: AC
  Administered 2014-06-05: 4 mg via INTRAVENOUS
  Filled 2014-06-05: qty 2

## 2014-06-05 MED ORDER — IOHEXOL 300 MG/ML  SOLN
50.0000 mL | Freq: Once | INTRAMUSCULAR | Status: AC | PRN
  Administered 2014-06-05: 50 mL via ORAL

## 2014-06-05 MED ORDER — LORAZEPAM 2 MG/ML IJ SOLN: 0.5000 mg | Freq: Once | INTRAMUSCULAR | Status: DC

## 2014-06-05 NOTE — H&P (Signed)
[facility] History and Physical  Barry LevelKevin Banh RUE:[id number] DOB: September 02, 1975 DOA: 06/05/2014  Referring physician:  Marlon Peliffany Greene PCP:  Pearson GrippeKIM, JAMES, MD   Chief Complaint:  Nausea, vomiting, abdominal pain  HPI:  The patient is a 39 y.o. year-old male with history of obesity status post gastric banding, kidney stones who presents with nausea, vomiting, diarrhea, and abdominal pain.   The patient was last at their baseline health about 3 days ago.  He was admitted to the hospital on 5/8 through 5/9 secondary to the same symptoms.  He was felt to have acute gastroenteritis and was able to tolerate a clear liquid diet prior to discharge. He was advised to follow-up with his primary care doctor. After returning home he was unable to keep down food or beverage and came back to the emergency department where he was given Toradol, fentanyl, Dilaudid, Ativan, Zofran, Phenergan for nausea, and IV fluids without relief.  He states that he has had continuous nausea with episodes of vomiting that have lasted for over an hour, mostly he is. Nonbilious but did have some possible coffee-ground emesis a day ago which has since resolved. He had frequent watery bowel movements 2 days ago which resolved for 24 hours but he had another watery stool this morning. No melena or bright red blood. He has had some pain in his left testicle which radiates up to the left groin, and no penile discharge, scrotal swelling, erythema, bulges. Pain in the left lower quadrant is 10 out of 10 right before he vomits and then is reduced to a 7 or 8 out of 10 with pain medication. Movement helps relieving some of his discomfort. He states this feels similar to previous kidney stones.  He denies sick contacts.  In the emergency department, his vital signs were stable. Labs were notable for a CO2 of 17 and urinalysis had trace LE and ketones and a high specific gravity. CT scan demonstrates diverticulosis without evidence of  diverticulitis, bilateral nonobstructing renal calculi, fatty liver, tiny gallstones without evidence of cholecystitis, incidentally noted hemangioma at L3, tiny calcification of the prostate. Trace calcification of the distal common iliac artery.  There are no abnormalities on CT scan to explain his left lower quadrant pain.  Review of Systems:  General:  Denies fevers, positive chills, weight loss or gain HEENT:  Denies changes to hearing and vision, rhinorrhea, sinus congestion, sore throat CV:  Denies chest pain and palpitations, lower extremity edema.  PULM:  Denies SOB, wheezing, cough.   GI:  Per history of present illness   GU:  Denies dysuria, frequency, urgency ENDO:  Denies polyuria, polydipsia.   HEME:  Denies hematemesis, blood in stools, melena, abnormal bruising or bleeding.  LYMPH:  Denies lymphadenopathy.   MSK:  Denies arthralgias, myalgias.   DERM:  Denies skin rash or ulcer.   NEURO:  Denies focal numbness, weakness, slurred speech, confusion, facial droop.  PSYCH:  Denies anxiety and depression.    Past Medical History  Diagnosis Date  . Weight increase   . Sleep difficulties   . Arthritis   . Kidney stones    Past Surgical History  Procedure Laterality Date  . Vasectomy  2009  . Tonsillectomy  2011  . 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 Tonsillectomy     Social History:  reports that he has been smoking Cigarettes.  He has been smoking about 0.25 packs per day. He has never used smokeless tobacco. He reports that he drinks alcohol. He  reports that he does not use illicit drugs.  No Known Allergies  Family History  Problem Relation Age of Onset  . Heart disease Mother   . Kidney Stones    . Gallstones       Prior to Admission medications   Medication Sig Start Date End Date Taking? Authorizing Provider  traMADol (ULTRAM) 50 MG tablet Take 50 mg by mouth every 8 (eight) hours as needed for pain.  07/03/10  Yes Historical Provider, MD  traZODone  (DESYREL) 50 MG tablet Take 1 tablet by mouth at bedtime as needed for sleep.  04/30/14  Yes Historical Provider, MD  HYDROcodone-acetaminophen (NORCO) 5-325 MG per tablet Take 1 tablet by mouth every 6 (six) hours as needed for severe pain. 06/03/14   Pricilla LovelessScott Goldston, MD  lidocaine (LIDODERM) 5 % Place 1 patch onto the skin daily.  05/24/12   Historical Provider, MD  ondansetron (ZOFRAN ODT) 4 MG disintegrating tablet 4mg  ODT q4 hours prn nausea/vomiting 06/03/14   Pricilla LovelessScott Goldston, MD  promethazine (PHENERGAN) 25 MG suppository Place 1 suppository (25 mg total) rectally every 6 (six) hours as needed for nausea or vomiting. 06/03/14   Pricilla LovelessScott Goldston, MD   Physical Exam: Filed Vitals:   06/05/14 0355 06/05/14 0859  BP: 167/93 159/87  Pulse: 56 83  Temp: 98.3 F (36.8 C)   TempSrc: Oral   Resp: 20 18  SpO2: 100% 99%     General:  Adult male, perioral pallor, no acute distress but frequent heaving during exam and interview  Eyes:  PERRL, anicteric, non-injected.  ENT:  Nares clear.  OP with erythema and petechiae on the soft palate, no exudates or tonsillar erythema.  MMM.  Neck:  Supple without TM or JVD.    Lymph:  No cervical, supraclavicular, or submandibular LAD.  Cardiovascular:  RRR, normal S1, S2, without m/r/g.  2+ pulses, warm extremities  Respiratory:  CTA bilaterally without increased WOB.  Abdomen:  Hypoactive BS.  Soft, ND, mildly tender to palpation in the left lower quadrant without rebound or guarding.    GU: Exam supervised by Chrissie NoaWilliam, nurse tech, normal-appearing penis and scrotum. Mild tenderness to palpation of the left testicle which was not warm, swollen. He had no nodules. The epididymis was not tender. No obvious hernias. No skin rash.  Skin:  No rashes or focal lesions.  Musculoskeletal:  Normal bulk and tone.  No LE edema.  Psychiatric:  A & O x 4.  Appropriate affect.  Neurologic:  CN 3-12 intact.  5/5 strength.  Sensation intact.  Labs on Admission:  Basic  Metabolic Panel:  Recent Labs Lab 06/03/14 1820 06/04/14 0418 06/05/14 0420  NA 139 141 139  K 4.1 3.9 3.5  CL 110 111 108  CO2 20* 22 17*  GLUCOSE 154* 114* 104*  BUN 10 8 11   CREATININE 0.86 0.77 0.85  CALCIUM 9.5 8.3* 8.9   Liver Function Tests:  Recent Labs Lab 06/03/14 1820 06/05/14 0420  AST 34 27  ALT 25 25  ALKPHOS 68 62  BILITOT 1.2 1.1  PROT 8.0 7.4  ALBUMIN 4.6 4.2    Recent Labs Lab 06/03/14 1820 06/05/14 0420  LIPASE 16* 14*   No results for input(s): AMMONIA in the last 168 hours. CBC:  Recent Labs Lab 06/03/14 1820 06/04/14 0418 06/05/14 0420  WBC 10.5 11.1* 9.1  NEUTROABS 9.6*  --   --   HGB 15.9 14.5 15.4  HCT 45.8 42.8 45.2  MCV 91.6 93.7 92.1  PLT  263 224 235   Cardiac Enzymes: No results for input(s): CKTOTAL, CKMB, CKMBINDEX, TROPONINI in the last 168 hours.  BNP (last 3 results) No results for input(s): BNP in the last 8760 hours.  ProBNP (last 3 results) No results for input(s): PROBNP in the last 8760 hours.  CBG: No results for input(s): GLUCAP in the last 168 hours.  Radiological Exams on Admission: Ct Abdomen Pelvis W Contrast  06/05/2014   CLINICAL DATA:  39 year old male with left lower quadrant pain 245 a.m. White blood cells and red blood cells in urine. History kidney stones, vasectomy and laparoscopic gastric banding. Initial encounter.  EXAM: CT ABDOMEN AND PELVIS WITH CONTRAST  TECHNIQUE: Multidetector CT imaging of the abdomen and pelvis was performed using the standard protocol following bolus administration of intravenous contrast.  CONTRAST:  OMNIPAQUE IOHEXOL 300 MG/ML  SOLN  COMPARISON:  04/14/2005 CT abdomen pelvis.  FINDINGS: Lung bases clear.  Post gastric banding without complication noted. Scattered colonic diverticula most notable sigmoid colon without evidence of extra luminal bowel inflammatory process, free fluid or free air.  Tiny of bilateral nonobstructing bilateral renal calculi. No CT  evidence of inflammation of the kidneys however this possibility would need to be based on results of urinalysis.  Diffuse fatty infiltration of the liver without focal worrisome mass.  Tiny gallstones. No CT evidence of gallbladder inflammation however if there is right upper quadrant tenderness, gallbladder ultrasound can be obtained for further delineation.  No worrisome splenic, adrenal or pancreatic abnormality.  No abdominal aortic aneurysm. Trace calcification distal right common iliac artery.  No adenopathy.  No bowel containing hernia.  Hemangioma L3 vertebral body. Scattered mild degenerative changes lower thoracic lumbar spine.  Noncontrast filled views of the urinary bladder unremarkable.  Minimal asymmetry prostate gland with tiny calcification to left of midline.  IMPRESSION: Post gastric banding. Scattered colonic diverticula most notable sigmoid colon without evidence of extra luminal bowel inflammatory process, free fluid or free air.  Tiny of bilateral nonobstructing bilateral renal calculi. No CT evidence of inflammation of the kidneys however this possibility would need to be based on results of urinalysis.  Diffuse fatty infiltration of the liver.  Tiny gallstones. No CT evidence of gallbladder inflammation however if there is right upper quadrant tenderness, gallbladder ultrasound can be obtained for further delineation.  Trace calcification distal right common iliac artery.  Hemangioma L3 vertebral body. Scattered mild degenerative changes lower thoracic and lumbar spine.  Minimal asymmetry prostate gland with tiny calcification to left of midline.   Electronically Signed   By: Lacy Duverney M.D.   On: 06/05/2014 07:37    EKG: pending  Assessment/Plan Principal Problem:   Nausea vomiting and diarrhea Active Problems:   Acute gastroenteritis   AP (abdominal pain)   Testicular/scrotal pain   Metabolic acidosis with normal anion gap and bicarbonate losses  ---  Nausea, vomiting,  diarrhea, and abdominal pain.  LFTs, lipase within normal limits. Urinalysis with only trace leukocyte esterase. CT scan did not demonstrate an etiology for his pain. Suspect this may be gastroenteritis. -  Stool culture and GI pathogen panel -  GI cocktail -  PPI -  Schedule Reglan and zofran -  Continue prn Phenergan -  IV fluids -  Continue dilaudid for pain -  Stool culture, C. Diff PCR, and GI pathogen panel -  Enteric precautions  Scrotal pain, no obvious hernias or other abnl on exam but could be concerning for epididymitis -  GC/Ch -  Testicular  US  Non-gap metabolic acidosis likely secondary to diarrhea -  IV fluids and repeat in a.m.  Diet:  Clear liquid diet Access:  PIV IVF:  Yes Proph:  Lovenox  Code Status: Full Family Communication: patient and wife Disposition Plan: Admit to MedSurg  Time spent: 60 min Renae Fickle [facility] Pager [phone]  If 7PM-7AM, please contact night-coverage [email or web address] Password [email or web address] 06/05/2014, 12:58 PM

## 2014-06-05 NOTE — ED Notes (Signed)
Patient was discharged from hospital 06/04/14 afternoon after admission with abdominal pain.  Reports he felt better for several hours, then awoke at 0245 with excruciating LLQ pain.  He is currently vomiting.  Reports history of kidney stones and when asked, says he has not urinated in several hours.

## 2014-06-05 NOTE — ED Notes (Signed)
Pt to US.

## 2014-06-05 NOTE — ED Provider Notes (Signed)
CSN: [id number]     Arrival date & time 06/05/14  0348 History   First MD Initiated Contact with Patient 06/05/14 [phone]     Chief Complaint  Patient presents with  . Abdominal Pain     (Consider location/radiation/quality/duration/timing/severity/associated sxs/prior Treatment) Patient is a 39 y.o. male presenting with abdominal pain. The history is provided by the patient. No language interpreter was used.  Abdominal Pain Pain location:  LLQ Associated symptoms: nausea and vomiting   Associated symptoms: no chills, no constipation, no fever and no hematuria   Associated symptoms comment:  Patient's symptoms of generalized abdominal pain, nausea, vomiting and diarrhea started 2 days ago. He was evaluated in the emergency department and admitted for intractable vomiting. He denies fever at any time. No bloody emesis or bowel movements. He was discharged after an observation period feeling improved. This morning around 2:45 he woke up with recurrent symptoms of pain now localized to the LLQ, and vomiting but no diarrhea.    Past Medical History  Diagnosis Date  . Weight increase   . Sleep difficulties   . Arthritis   . Kidney stones    Past Surgical History  Procedure Laterality Date  . Vasectomy  2009  . Tonsillectomy  2011  . 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 Tonsillectomy     Family History  Problem Relation Age of Onset  . Heart disease Mother    History  Substance Use Topics  . Smoking status: Current Every Day Smoker -- 0.25 packs/day    Types: Cigarettes    Last Attempt to Quit: 01/27/2008  . Smokeless tobacco: Never Used  . Alcohol Use: Yes     Comment: occasional    Review of Systems  Constitutional: Negative for fever and chills.  HENT: Negative.   Respiratory: Negative.   Cardiovascular: Negative.   Gastrointestinal: Positive for nausea, vomiting and abdominal pain. Negative for constipation.  Genitourinary: Positive for decreased urine volume.  Negative for hematuria and flank pain.  Musculoskeletal: Negative.   Skin: Negative.   Neurological: Negative.       Allergies  Review of patient's allergies indicates no known allergies.  Home Medications   Prior to Admission medications   Medication Sig Start Date End Date Taking? Authorizing Provider  HYDROcodone-acetaminophen (NORCO) 5-325 MG per tablet Take 1 tablet by mouth every 6 (six) hours as needed for severe pain. 06/03/14   Pricilla LovelessScott Goldston, MD  lidocaine (LIDODERM) 5 % Place 1 patch onto the skin daily.  05/24/12   Historical Provider, MD  ondansetron (ZOFRAN ODT) 4 MG disintegrating tablet 4mg  ODT q4 hours prn nausea/vomiting 06/03/14   Pricilla LovelessScott Goldston, MD  promethazine (PHENERGAN) 25 MG suppository Place 1 suppository (25 mg total) rectally every 6 (six) hours as needed for nausea or vomiting. 06/03/14   Pricilla LovelessScott Goldston, MD  traMADol (ULTRAM) 50 MG tablet Take 50 mg by mouth every 8 (eight) hours as needed for pain.  07/03/10   Historical Provider, MD  traZODone (DESYREL) 50 MG tablet Take 1 tablet by mouth at bedtime as needed for sleep.  04/30/14   Historical Provider, MD   BP 167/93 mmHg  Pulse 56  Temp(Src) 98.3 F (36.8 C) (Oral)  Resp 20  SpO2 100% Physical Exam  Constitutional: He is oriented to person, place, and time. He appears well-developed and well-nourished.  Neck: Normal range of motion.  Pulmonary/Chest: Effort normal.  Abdominal: Soft. He exhibits no distension and no mass. Bowel sounds are decreased. There is tenderness in  the left lower quadrant. There is no rigidity and no guarding.    Musculoskeletal: Normal range of motion.  Neurological: He is alert and oriented to person, place, and time.  Skin: Skin is warm and dry.  Psychiatric: He has a normal mood and affect.    ED Course  Procedures (including critical care time) Labs Review Labs Reviewed  COMPREHENSIVE METABOLIC PANEL - Abnormal; Notable for the following:    CO2 17 (*)    Glucose, Bld 104  (*)    All other components within normal limits  URINALYSIS, ROUTINE W REFLEX MICROSCOPIC - Abnormal; Notable for the following:    Color, Urine AMBER (*)    APPearance CLOUDY (*)    Bilirubin Urine SMALL (*)    Ketones, ur >80 (*)    Leukocytes, UA TRACE (*)    All other components within normal limits  LIPASE, BLOOD - Abnormal; Notable for the following:    Lipase 14 (*)    All other components within normal limits  URINE MICROSCOPIC-ADD ON - Abnormal; Notable for the following:    Bacteria, UA FEW (*)    All other components within normal limits  CBC    Imaging Review No results found.   EKG Interpretation None      MDM   Final diagnoses:  None    1. Abdominal pain  The patient has had retching without observed emesis. Symptoms of nausea controlled with phenergan. Levsin given for pain  Chart reviewed. He has had lap band surgery in the past. There has not been any imaging done and as this is the patient's second presentation in 48 hours, feel that CT scan is appropriate for further evaluation. Patient care transferred to Marlon Peliffany Greene, PA-C, pending CT.    Elpidio AnisShari Deckard Stuber, PA-C 06/05/14 [id number]  Marisa Severinlga Otter, MD 06/05/14 2308

## 2014-06-05 NOTE — ED Notes (Signed)
Pt states that he started with abd pain and vomiting on Sunday; pt states that he was admitted and was just discharge this afternoon; pt states that he began vomiting and having severe LLQ abd pain a couple of hours ago; pt dry heaving in triage

## 2014-06-05 NOTE — ED Provider Notes (Signed)
Patient handed off by Barry BookmanShari, PA-C  He was discharged on 5/9 for intractable vomiting, received no medications for home. Reports unable to tolerate PO and severe abdominal pain.  Results for orders placed or performed during the hospital encounter of 06/05/14  CBC (if pt has a temp above 100.48F)  Result Value Ref Range   WBC 9.1 4.0 - 10.5 K/uL   RBC 4.91 4.22 - 5.81 MIL/uL   Hemoglobin 15.4 13.0 - 17.0 g/dL   HCT 16.145.2 09.639.0 - 04.552.0 %   MCV 92.1 78.0 - 100.0 fL   MCH 31.4 26.0 - 34.0 pg   MCHC 34.1 30.0 - 36.0 g/dL   RDW 40.912.2 81.111.5 - 91.415.5 %   Platelets 235 150 - 400 K/uL  Comprehensive metabolic panel (if pt has a temp above 100.48F)  Result Value Ref Range   Sodium 139 135 - 145 mmol/L   Potassium 3.5 3.5 - 5.1 mmol/L   Chloride 108 101 - 111 mmol/L   CO2 17 (L) 22 - 32 mmol/L   Glucose, Bld 104 (H) 70 - 99 mg/dL   BUN 11 6 - 20 mg/dL   Creatinine, Ser 7.820.85 0.61 - 1.24 mg/dL   Calcium 8.9 8.9 - 95.610.3 mg/dL   Total Protein 7.4 6.5 - 8.1 g/dL   Albumin 4.2 3.5 - 5.0 g/dL   AST 27 15 - 41 U/L   ALT 25 17 - 63 U/L   Alkaline Phosphatase 62 38 - 126 U/L   Total Bilirubin 1.1 0.3 - 1.2 mg/dL   GFR calc non Af Amer >60 >60 mL/min   GFR calc Af Amer >60 >60 mL/min   Anion gap 14 5 - 15  Urinalysis, Routine w reflex microscopic (if pt has temp above 100.48F)  Result Value Ref Range   Color, Urine AMBER (A) YELLOW   APPearance CLOUDY (A) CLEAR   Specific Gravity, Urine 1.028 1.005 - 1.030   pH 5.5 5.0 - 8.0   Glucose, UA NEGATIVE NEGATIVE mg/dL   Hgb urine dipstick NEGATIVE NEGATIVE   Bilirubin Urine SMALL (A) NEGATIVE   Ketones, ur >80 (A) NEGATIVE mg/dL   Protein, ur NEGATIVE NEGATIVE mg/dL   Urobilinogen, UA 1.0 0.0 - 1.0 mg/dL   Nitrite NEGATIVE NEGATIVE   Leukocytes, UA TRACE (A) NEGATIVE  Lipase, blood  Result Value Ref Range   Lipase 14 (L) 22 - 51 U/L  Urine microscopic-add on  Result Value Ref Range   Squamous Epithelial / LPF RARE RARE   WBC, UA 3-6 <3 WBC/hpf    Bacteria, UA FEW (A) RARE   Urine-Other MUCOUS PRESENT    Ct Abdomen Pelvis W Contrast  06/05/2014   CLINICAL DATA:  39 year old male with left lower quadrant pain 245 a.m. White blood cells and red blood cells in urine. History kidney stones, vasectomy and laparoscopic gastric banding. Initial encounter.  EXAM: CT ABDOMEN AND PELVIS WITH CONTRAST  TECHNIQUE: Multidetector CT imaging of the abdomen and pelvis was performed using the standard protocol following bolus administration of intravenous contrast.  CONTRAST:  100mL OMNIPAQUE IOHEXOL 300 MG/ML  SOLN  COMPARISON:  04/14/2005 CT abdomen pelvis.  FINDINGS: Lung bases clear.  Post gastric banding without complication noted. Scattered colonic diverticula most notable sigmoid colon without evidence of extra luminal bowel inflammatory process, free fluid or free air.  Tiny of bilateral nonobstructing bilateral renal calculi. No CT evidence of inflammation of the kidneys however this possibility would need to be based on results of urinalysis.  Diffuse fatty  infiltration of the liver without focal worrisome mass.  Tiny gallstones. No CT evidence of gallbladder inflammation however if there is right upper quadrant tenderness, gallbladder ultrasound can be obtained for further delineation.  No worrisome splenic, adrenal or pancreatic abnormality.  No abdominal aortic aneurysm. Trace calcification distal right common iliac artery.  No adenopathy.  No bowel containing hernia.  Hemangioma L3 vertebral body. Scattered mild degenerative changes lower thoracic lumbar spine.  Noncontrast filled views of the urinary bladder unremarkable.  Minimal asymmetry prostate gland with tiny calcification to left of midline.  IMPRESSION: Post gastric banding. Scattered colonic diverticula most notable sigmoid colon without evidence of extra luminal bowel inflammatory process, free fluid or free air.  Tiny of bilateral nonobstructing bilateral renal calculi. No CT evidence of  inflammation of the kidneys however this possibility would need to be based on results of urinalysis.  Diffuse fatty infiltration of the liver.  Tiny gallstones. No CT evidence of gallbladder inflammation however if there is right upper quadrant tenderness, gallbladder ultrasound can be obtained for further delineation.  Trace calcification distal right common iliac artery.  Hemangioma L3 vertebral body. Scattered mild degenerative changes lower thoracic and lumbar spine.  Minimal asymmetry prostate gland with tiny calcification to left of midline.   Electronically Signed   By: Lacy DuverneySteven  Olson M.D.   On: 06/05/2014 07:37    Medications  ondansetron (ZOFRAN) injection 4 mg (4 mg Intravenous Given 06/05/14 0429)  fentaNYL (SUBLIMAZE) injection 50 mcg (50 mcg Intravenous Given 06/05/14 0446)  promethazine (PHENERGAN) injection 12.5 mg (12.5 mg Intravenous Given 06/05/14 0543)  iohexol (OMNIPAQUE) 300 MG/ML solution 50 mL (50 mLs Oral Contrast Given 06/05/14 0619)  hyoscyamine (LEVSIN) 0.5 MG/ML injection 0.125 mg (0.125 mg Intravenous Given 06/05/14 0716)  iohexol (OMNIPAQUE) 300 MG/ML solution 100 mL (100 mLs Intravenous Contrast Given 06/05/14 0657)  ketorolac (TORADOL) 30 MG/ML injection 30 mg (30 mg Intravenous Given 06/05/14 0815)  LORazepam (ATIVAN) injection 1 mg (1 mg Intravenous Given 06/05/14 0815)  ondansetron (ZOFRAN) injection 4 mg (4 mg Intravenous Given 06/05/14 0819)  HYDROmorphone (DILAUDID) injection 1 mg (1 mg Intravenous Given 06/05/14 0912)    After normal labs/CT scan, the patient has received many medications and fluids, Unable to tolerate PO, still nauseous and complaining of pain. Will CT abd/pelv.  Admit to [facility]. I spoke with Dr. Malachi BondsShort, Judie PetitM. Who has agreed to admit patient. Patient and family member agreeable to plan.  Barry Peliffany Markies Mowatt, PA-C 06/05/14 1047  Kristen N Ward, DO 06/05/14 1436

## 2014-06-06 LAB — BASIC METABOLIC PANEL
Anion gap: 10 (ref 5–15)
BUN: 10 mg/dL (ref 6–20)
CO2: 26 mmol/L (ref 22–32)
Calcium: 8.3 mg/dL — ABNORMAL LOW (ref 8.9–10.3)
Chloride: 106 mmol/L (ref 101–111)
Creatinine, Ser: 0.87 mg/dL (ref 0.61–1.24)
GFR calc Af Amer: >60 mL/min (ref >60)
GFR calc non Af Amer: >60 mL/min (ref >60)
GLUCOSE: 104 mg/dL — AB (ref 70–99)
Potassium: 3.7 mmol/L (ref 3.5–5.1)
Sodium: 142 mmol/L (ref 135–145)

## 2014-06-06 LAB — CBC
HCT: 40.1 % (ref 39.0–52.0)
Hemoglobin: 13.7 g/dL (ref 13.0–17.0)
MCH: 31.3 pg (ref 26.0–34.0)
MCHC: 34.2 g/dL (ref 30.0–36.0)
MCV: 91.6 fL (ref 78.0–100.0)
Platelets: 205 10*3/uL (ref 150–400)
RBC: 4.38 MIL/uL (ref 4.22–5.81)
RDW: 12 % (ref 11.5–15.5)
WBC: 7.7 10*3/uL (ref 4.0–10.5)

## 2014-06-06 LAB — CLOSTRIDIUM DIFFICILE BY PCR: Toxigenic C. Difficile by PCR: NEGATIVE

## 2014-06-06 LAB — GC/CHLAMYDIA PROBE AMP (~~LOC~~) NOT AT ARMC
CHLAMYDIA, DNA PROBE: NEGATIVE
NEISSERIA GONORRHEA: NEGATIVE

## 2014-06-06 LAB — HIV ANTIBODY (ROUTINE TESTING W REFLEX): HIV Screen 4th Generation wRfx: NONREACTIVE

## 2014-06-06 MED ORDER — ZOLPIDEM TARTRATE 5 MG PO TABS
5.0000 mg | ORAL_TABLET | Freq: Every evening | ORAL | Status: DC | PRN
  Administered 2014-06-06: 5 mg via ORAL
  Filled 2014-06-06: qty 1

## 2014-06-06 MED ORDER — CIPROFLOXACIN IN D5W 400 MG/200ML IV SOLN
400.0000 mg | Freq: Two times a day (BID) | INTRAVENOUS | Status: DC
  Administered 2014-06-06 – 2014-06-07 (×2): 400 mg via INTRAVENOUS
  Filled 2014-06-06 (×3): qty 200

## 2014-06-06 NOTE — Progress Notes (Signed)
Pt tolerating clear liquids. Reports abdominal "discomfort" but reports not needing pain medicine at this time.  Paged MD about advancing diet, pt to have full liquid diet for dinner.   Pt still unable to provide stool samples.

## 2014-06-06 NOTE — Progress Notes (Signed)
Error pt not re admission last hospitalization was observation

## 2014-06-06 NOTE — Progress Notes (Signed)
[facility] PROGRESS NOTE  Barry Parsons KGM:[id number] DOB: May 11, 1975 DOA: 06/05/2014 PCP: Pearson GrippeKIM, JAMES, MD  Assessment/Plan: 1. Nausea, vomiting and abdominal pain/ diarrhea: Probably from viral gastroenteritis. Gi pathogen panel ordered, no sample yet,  His symptoms are improving. Advance diet as tolerated.   Scrotal pain: US scrotum unremarkable.  Possibly from epididymitis.  Empiric antibiotics.    Non gap acidosis: Resolved.    Code Status: full code.  Family Communication: none at bedside Disposition Plan: pending.    Consultants:  none  Procedures:  none  Antibiotics:  none  HPI/Subjective: No nausea or diarrhea. No abdominal pain.   Objective: Filed Vitals:   06/06/14 1350  BP: 105/62  Pulse: 59  Temp: 98.5 F (36.9 C)  Resp: 16    Intake/Output Summary (Last 24 hours) at 06/06/14 1745 Last data filed at 06/06/14 1653  Gross per 24 hour  Intake 2448.75 ml  Output   1950 ml  Net 498.75 ml   Filed Weights   06/05/14 1630  Weight: 127.007 kg (280 lb)    Exam:   General:  Alert afebrile comfortable  Cardiovascular: s1s2  Respiratory: clear to auscultation, no wheezing or rhonchi  Abdomen: soft non tender non distended bowel sounds heard  Musculoskeletal: no pedal edema.   Data Reviewed: Basic Metabolic Panel:  Recent Labs Lab 06/03/14 1820 06/04/14 0418 06/05/14 0420 06/06/14 0425  NA 139 141 139 142  K 4.1 3.9 3.5 3.7  CL 110 111 108 106  CO2 20* 22 17* 26  GLUCOSE 154* 114* 104* 104*  BUN 10 8 11 10   CREATININE 0.86 0.77 0.85 0.87  CALCIUM 9.5 8.3* 8.9 8.3*   Liver Function Tests:  Recent Labs Lab 06/03/14 1820 06/05/14 0420  AST 34 27  ALT 25 25  ALKPHOS 68 62  BILITOT 1.2 1.1  PROT 8.0 7.4  ALBUMIN 4.6 4.2    Recent Labs Lab 06/03/14 1820 06/05/14 0420  LIPASE 16* 14*   No results for input(s): AMMONIA in the last 168 hours. CBC:  Recent Labs Lab 06/03/14 1820 06/04/14 0418  06/05/14 0420 06/06/14 0425  WBC 10.5 11.1* 9.1 7.7  NEUTROABS 9.6*  --   --   --   HGB 15.9 14.5 15.4 13.7  HCT 45.8 42.8 45.2 40.1  MCV 91.6 93.7 92.1 91.6  PLT 263 224 235 205   Cardiac Enzymes: No results for input(s): CKTOTAL, CKMB, CKMBINDEX, TROPONINI in the last 168 hours. BNP (last 3 results) No results for input(s): BNP in the last 8760 hours.  ProBNP (last 3 results) No results for input(s): PROBNP in the last 8760 hours.  CBG: No results for input(s): GLUCAP in the last 168 hours.  No results found for this or any previous visit (from the past 240 hour(s)).   Studies: [place] Scrotum  06/05/2014   CLINICAL DATA:  LEFT testicular pain for 2 days.  Initial encounter.  EXAM: SCROTAL ULTRASOUND  DOPPLER ULTRASOUND OF THE TESTICLES  TECHNIQUE: Complete ultrasound examination of the testicles, epididymis, and other scrotal structures was performed. Color and spectral Doppler ultrasound were also utilized to evaluate blood flow to the testicles.  COMPARISON:  CT 06/05/2014.  FINDINGS: Right testicle  Measurements: 55 mm x 41 mm x 24 mm. No mass or microlithiasis visualized.  Left testicle  Measurements: 49 mm x 32 mm x 26 mm. No mass or microlithiasis visualized.  Right epididymis:  Normal in size and appearance.  Left epididymis:  Normal in size and appearance.  Hydrocele:  None visualized.  Varicocele:  LEFT varicocele incidentally noted.  Pulsed Doppler interrogation of both testes demonstrates normal low resistance arterial and venous waveforms bilaterally.  IMPRESSION: 1. Negative for testicular torsion. 2. Small LEFT varicocele.   Electronically Signed   By: Andreas Newport M.D.   On: 06/05/2014 15:25   Ct Abdomen Pelvis W Contrast  06/05/2014   CLINICAL DATA:  39 year old male with left lower quadrant pain 245 a.m. White blood cells and red blood cells in urine. History kidney stones, vasectomy and laparoscopic gastric banding. Initial encounter.  EXAM: CT ABDOMEN AND PELVIS WITH  CONTRAST  TECHNIQUE: Multidetector CT imaging of the abdomen and pelvis was performed using the standard protocol following bolus administration of intravenous contrast.  CONTRAST:  OMNIPAQUE IOHEXOL 300 MG/ML  SOLN  COMPARISON:  04/14/2005 CT abdomen pelvis.  FINDINGS: Lung bases clear.  Post gastric banding without complication noted. Scattered colonic diverticula most notable sigmoid colon without evidence of extra luminal bowel inflammatory process, free fluid or free air.  Tiny of bilateral nonobstructing bilateral renal calculi. No CT evidence of inflammation of the kidneys however this possibility would need to be based on results of urinalysis.  Diffuse fatty infiltration of the liver without focal worrisome mass.  Tiny gallstones. No CT evidence of gallbladder inflammation however if there is right upper quadrant tenderness, gallbladder ultrasound can be obtained for further delineation.  No worrisome splenic, adrenal or pancreatic abnormality.  No abdominal aortic aneurysm. Trace calcification distal right common iliac artery.  No adenopathy.  No bowel containing hernia.  Hemangioma L3 vertebral body. Scattered mild degenerative changes lower thoracic lumbar spine.  Noncontrast filled views of the urinary bladder unremarkable.  Minimal asymmetry prostate gland with tiny calcification to left of midline.  IMPRESSION: Post gastric banding. Scattered colonic diverticula most notable sigmoid colon without evidence of extra luminal bowel inflammatory process, free fluid or free air.  Tiny of bilateral nonobstructing bilateral renal calculi. No CT evidence of inflammation of the kidneys however this possibility would need to be based on results of urinalysis.  Diffuse fatty infiltration of the liver.  Tiny gallstones. No CT evidence of gallbladder inflammation however if there is right upper quadrant tenderness, gallbladder ultrasound can be obtained for further delineation.  Trace calcification distal  right common iliac artery.  Hemangioma L3 vertebral body. Scattered mild degenerative changes lower thoracic and lumbar spine.  Minimal asymmetry prostate gland with tiny calcification to left of midline.   Electronically Signed   By: Lacy Duverney M.D.   On: 06/05/2014 07:37   [place] Art/ven Flow Abd Pelv Doppler  06/05/2014   CLINICAL DATA:  LEFT testicular pain for 2 days.  Initial encounter.  EXAM: SCROTAL ULTRASOUND  DOPPLER ULTRASOUND OF THE TESTICLES  TECHNIQUE: Complete ultrasound examination of the testicles, epididymis, and other scrotal structures was performed. Color and spectral Doppler ultrasound were also utilized to evaluate blood flow to the testicles.  COMPARISON:  CT 06/05/2014.  FINDINGS: Right testicle  Measurements: 55 mm x 41 mm x 24 mm. No mass or microlithiasis visualized.  Left testicle  Measurements: 49 mm x 32 mm x 26 mm. No mass or microlithiasis visualized.  Right epididymis:  Normal in size and appearance.  Left epididymis:  Normal in size and appearance.  Hydrocele:  None visualized.  Varicocele:  LEFT varicocele incidentally noted.  Pulsed Doppler interrogation of both testes demonstrates normal low resistance arterial and venous waveforms bilaterally.  IMPRESSION: 1. Negative for testicular torsion. 2. Small LEFT varicocele.  Electronically Signed   By: Andreas NewportGeoffrey  Lamke M.D.   On: 06/05/2014 15:25    Scheduled Meds: . enoxaparin (LOVENOX) injection  0.5 mg/kg Subcutaneous Q24H  . metoCLOPramide (REGLAN) injection  10 mg Intravenous 4 times per day  . ondansetron (ZOFRAN) IV  4 mg Intravenous 4 times per day  . pantoprazole (PROTONIX) IV  40 mg Intravenous BID   Continuous Infusions: . dextrose 5 % and 0.45 % NaCl with KCl 20 mEq/L 125 mL/hr at 06/06/14 1603    Principal Problem:   Nausea vomiting and diarrhea Active Problems:   Acute gastroenteritis   AP (abdominal pain)   Testicular/scrotal pain   Metabolic acidosis with normal anion gap and bicarbonate losses    Nausea & vomiting    Time spent: 30 minutes    Chela Sutphen  [facility] Pager [phone] If 7PM-7AM, please contact night-coverage at [email or web address], password [email or web address] 06/06/2014, 5:45 PM  LOS: 1 day

## 2014-06-07 DIAGNOSIS — E876 Hypokalemia: Secondary | ICD-10-CM

## 2014-06-07 LAB — BASIC METABOLIC PANEL
Anion gap: 7 (ref 5–15)
BUN: 6 mg/dL (ref 6–20)
CO2: 24 mmol/L (ref 22–32)
Calcium: 8.3 mg/dL — ABNORMAL LOW (ref 8.9–10.3)
Chloride: 108 mmol/L (ref 101–111)
Creatinine, Ser: 0.71 mg/dL (ref 0.61–1.24)
Glucose, Bld: 107 mg/dL — ABNORMAL HIGH (ref 65–99)
POTASSIUM: 3.3 mmol/L — AB (ref 3.5–5.1)
SODIUM: 139 mmol/L (ref 135–145)

## 2014-06-07 LAB — CBC
HEMATOCRIT: 41.6 % (ref 39.0–52.0)
HEMOGLOBIN: 14.1 g/dL (ref 13.0–17.0)
MCH: 30.8 pg (ref 26.0–34.0)
MCHC: 33.9 g/dL (ref 30.0–36.0)
MCV: 90.8 fL (ref 78.0–100.0)
Platelets: 207 10*3/uL (ref 150–400)
RBC: 4.58 MIL/uL (ref 4.22–5.81)
RDW: 12 % (ref 11.5–15.5)
WBC: 7.6 10*3/uL (ref 4.0–10.5)

## 2014-06-07 MED ORDER — ONDANSETRON 4 MG PO TBDP: ORAL_TABLET | ORAL | Status: DC

## 2014-06-07 MED ORDER — CIPROFLOXACIN HCL 500 MG PO TABS: 500.0000 mg | ORAL_TABLET | Freq: Two times a day (BID) | ORAL | Status: DC

## 2014-06-07 MED ORDER — POTASSIUM CHLORIDE CRYS ER 20 MEQ PO TBCR
40.0000 meq | EXTENDED_RELEASE_TABLET | Freq: Once | ORAL | Status: AC
  Administered 2014-06-07: 40 meq via ORAL
  Filled 2014-06-07: qty 2

## 2014-06-07 NOTE — Progress Notes (Signed)
Pt c/o pain at left arm IV site. Site was beginning to infiltrate, IV removed. Pt with hives above site progressing up to armpit. Pt receiving IV cipro. Pt had a infiltration on the right arm yesterday before antibiotics were started and the hives were also present around the site. Pt did not c/o of itching, SOB, and states that he breaks out in hives often. Tama GanderKatherine Schorr NP paged and she stated to continue IV cipro as this is most likely a skin sensitivity vs an allergic rxn.

## 2014-06-07 NOTE — Discharge Summary (Signed)
PATIENT DETAILS Name: Barry Parsons Age: 39 y.o. Sex: male Date of Birth: 09/19/1975 MRN: [id number]. Admitting Physician: Renae FickleMackenzie Short, MD Talmage NapPCP:KIM, JAMES, MD  Admit Date: 06/05/2014 Discharge date: 06/07/2014  Recommendations for Outpatient Follow-up:  1. Repeat BMET at next visit 2. GI pathogen panel pending-please follow   PRIMARY DISCHARGE DIAGNOSIS:  Principal Problem:   Nausea vomiting and diarrhea Active Problems:   Acute gastroenteritis   AP (abdominal pain)   Testicular/scrotal pain   Metabolic acidosis with normal anion gap and bicarbonate losses   Nausea & vomiting      PAST MEDICAL HISTORY: Past Medical History  Diagnosis Date  . Weight increase   . Sleep difficulties   . Arthritis   . Kidney stones     DISCHARGE MEDICATIONS: Discharge Medication List as of 06/07/2014 12:34 PM    START taking these medications   Details  ciprofloxacin (CIPRO) 500 MG tablet Take 1 tablet (500 mg total) by mouth 2 (two) times daily., Starting 06/07/2014, Until Discontinued, Print      CONTINUE these medications which have CHANGED   Details  ondansetron (ZOFRAN ODT) 4 MG disintegrating tablet 4mg  ODT q4 hours prn nausea/vomiting, Print      CONTINUE these medications which have NOT CHANGED   Details  traMADol (ULTRAM) 50 MG tablet Take 50 mg by mouth every 8 (eight) hours as needed for pain. , Starting 07/03/2010, Until Discontinued, Historical Med    traZODone (DESYREL) 50 MG tablet Take 1 tablet by mouth at bedtime as needed for sleep. , Starting 04/30/2014, Until Discontinued, Historical Med      STOP taking these medications     HYDROcodone-acetaminophen (NORCO) 5-325 MG per tablet      promethazine (PHENERGAN) 25 MG suppository         ALLERGIES:  No Known Allergies  BRIEF HPI:  See H&P, Labs, Consult and Test reports for all details in brief, patient is a 39 y.o. year-old male with history of obesity status post gastric banding, kidney stones who  presented with nausea, vomiting, diarrhea.  CONSULTATIONS:   None  PERTINENT RADIOLOGIC STUDIES: [place] Scrotum  06/05/2014   CLINICAL DATA:  LEFT testicular pain for 2 days.  Initial encounter.  EXAM: SCROTAL ULTRASOUND  DOPPLER ULTRASOUND OF THE TESTICLES  TECHNIQUE: Complete ultrasound examination of the testicles, epididymis, and other scrotal structures was performed. Color and spectral Doppler ultrasound were also utilized to evaluate blood flow to the testicles.  COMPARISON:  CT 06/05/2014.  FINDINGS: Right testicle  Measurements: 55 mm x 41 mm x 24 mm. No mass or microlithiasis visualized.  Left testicle  Measurements: 49 mm x 32 mm x 26 mm. No mass or microlithiasis visualized.  Right epididymis:  Normal in size and appearance.  Left epididymis:  Normal in size and appearance.  Hydrocele:  None visualized.  Varicocele:  LEFT varicocele incidentally noted.  Pulsed Doppler interrogation of both testes demonstrates normal low resistance arterial and venous waveforms bilaterally.  IMPRESSION: 1. Negative for testicular torsion. 2. Small LEFT varicocele.   Electronically Signed   By: Andreas NewportGeoffrey  Lamke M.D.   On: 06/05/2014 15:25   Ct Abdomen Pelvis W Contrast  06/05/2014   CLINICAL DATA:  39 year old male with left lower quadrant pain 245 a.m. White blood cells and red blood cells in urine. History kidney stones, vasectomy and laparoscopic gastric banding. Initial encounter.  EXAM: CT ABDOMEN AND PELVIS WITH CONTRAST  TECHNIQUE: Multidetector CT imaging of the abdomen and pelvis was performed using the standard protocol  following bolus administration of intravenous contrast.  CONTRAST:  100mL OMNIPAQUE IOHEXOL 300 MG/ML  SOLN  COMPARISON:  04/14/2005 CT abdomen pelvis.  FINDINGS: Lung bases clear.  Post gastric banding without complication noted. Scattered colonic diverticula most notable sigmoid colon without evidence of extra luminal bowel inflammatory process, free fluid or free air.  Tiny of bilateral  nonobstructing bilateral renal calculi. No CT evidence of inflammation of the kidneys however this possibility would need to be based on results of urinalysis.  Diffuse fatty infiltration of the liver without focal worrisome mass.  Tiny gallstones. No CT evidence of gallbladder inflammation however if there is right upper quadrant tenderness, gallbladder ultrasound can be obtained for further delineation.  No worrisome splenic, adrenal or pancreatic abnormality.  No abdominal aortic aneurysm. Trace calcification distal right common iliac artery.  No adenopathy.  No bowel containing hernia.  Hemangioma L3 vertebral body. Scattered mild degenerative changes lower thoracic lumbar spine.  Noncontrast filled views of the urinary bladder unremarkable.  Minimal asymmetry prostate gland with tiny calcification to left of midline.  IMPRESSION: Post gastric banding. Scattered colonic diverticula most notable sigmoid colon without evidence of extra luminal bowel inflammatory process, free fluid or free air.  Tiny of bilateral nonobstructing bilateral renal calculi. No CT evidence of inflammation of the kidneys however this possibility would need to be based on results of urinalysis.  Diffuse fatty infiltration of the liver.  Tiny gallstones. No CT evidence of gallbladder inflammation however if there is right upper quadrant tenderness, gallbladder ultrasound can be obtained for further delineation.  Trace calcification distal right common iliac artery.  Hemangioma L3 vertebral body. Scattered mild degenerative changes lower thoracic and lumbar spine.  Minimal asymmetry prostate gland with tiny calcification to left of midline.   Electronically Signed   By: Lacy DuverneySteven  Olson M.D.   On: 06/05/2014 07:37   [place] Art/ven Flow Abd Pelv Doppler  06/05/2014   CLINICAL DATA:  LEFT testicular pain for 2 days.  Initial encounter.  EXAM: SCROTAL ULTRASOUND  DOPPLER ULTRASOUND OF THE TESTICLES  TECHNIQUE: Complete ultrasound examination of  the testicles, epididymis, and other scrotal structures was performed. Color and spectral Doppler ultrasound were also utilized to evaluate blood flow to the testicles.  COMPARISON:  CT 06/05/2014.  FINDINGS: Right testicle  Measurements: 55 mm x 41 mm x 24 mm. No mass or microlithiasis visualized.  Left testicle  Measurements: 49 mm x 32 mm x 26 mm. No mass or microlithiasis visualized.  Right epididymis:  Normal in size and appearance.  Left epididymis:  Normal in size and appearance.  Hydrocele:  None visualized.  Varicocele:  LEFT varicocele incidentally noted.  Pulsed Doppler interrogation of both testes demonstrates normal low resistance arterial and venous waveforms bilaterally.  IMPRESSION: 1. Negative for testicular torsion. 2. Small LEFT varicocele.   Electronically Signed   By: Andreas NewportGeoffrey  Lamke M.D.   On: 06/05/2014 15:25     PERTINENT LAB RESULTS: CBC:  Recent Labs  06/06/14 0425 06/07/14 0353  WBC 7.7 7.6  HGB 13.7 14.1  HCT 40.1 41.6  PLT 205 207   CMET CMP     Component Value Date/Time   NA 139 06/07/2014 0353   K 3.3* 06/07/2014 0353   CL 108 06/07/2014 0353   CO2 24 06/07/2014 0353   GLUCOSE 107* 06/07/2014 0353   BUN 6 06/07/2014 0353   CREATININE 0.71 06/07/2014 0353   CREATININE 0.78 09/12/2010 1100   CALCIUM 8.3* 06/07/2014 0353   PROT 7.4 06/05/2014 0420  ALBUMIN 4.2 06/05/2014 0420   AST 27 06/05/2014 0420   ALT 25 06/05/2014 0420   ALKPHOS 62 06/05/2014 0420   BILITOT 1.1 06/05/2014 0420   GFRNONAA >60 06/07/2014 0353   GFRAA >60 06/07/2014 0353    GFR Estimated Creatinine Clearance: 166.7 mL/min (by C-G formula based on Cr of 0.71).  Recent Labs  06/05/14 0420  LIPASE 14*   No results for input(s): CKTOTAL, CKMB, CKMBINDEX, TROPONINI in the last 72 hours. Invalid input(s): POCBNP No results for input(s): DDIMER in the last 72 hours. No results for input(s): HGBA1C in the last 72 hours. No results for input(s): CHOL, HDL, LDLCALC, TRIG,  CHOLHDL, LDLDIRECT in the last 72 hours. No results for input(s): TSH, T4TOTAL, T3FREE, THYROIDAB in the last 72 hours.  Invalid input(s): FREET3 No results for input(s): VITAMINB12, FOLATE, FERRITIN, TIBC, IRON, RETICCTPCT in the last 72 hours. Coags: No results for input(s): INR in the last 72 hours.  Invalid input(s): PT Microbiology: Recent Results (from the past 240 hour(s))  Clostridium Difficile by PCR     Status: None   Collection Time: 06/06/14  4:57 PM  Result Value Ref Range Status   C difficile by pcr NEGATIVE NEGATIVE Final     BRIEF HOSPITAL COURSE:   Principal Problem:   Nausea vomiting and diarrhea: Likely viral syndrome. Admitted and empirically started on ciprofloxacin. C. difficile PCR negative, GI pathogen panel pending. No further nausea, vomiting or diarrhea-abdomen is soft-patient is requesting discharge. We will continue with ciprofloxacin for a few more days. Have asked patient to have his primary care practitioner follow GI pathogen panel.  Active Problems:   Left groin pain: Denies any testicular pain to me this morning-testicular ultrasound negative for torsion. On exam this morning, no testicular tenderness at all, started on Cipro-we'll continue on discharge for a few more days.     Hypokalemia: Continue to GI loss, we will give one dose of potassium prior to discharge-have asked patient to check BMET with PCP at next visit.   TODAY-DAY OF DISCHARGE:  Subjective:   Ladarrian Asencio today has no headache,no chest abdominal pain,no new weakness tingling or numbness, feels much better wants to go home today.   Objective:   Blood pressure 127/65, pulse 65, temperature 98.2 F (36.8 C), temperature source Oral, resp. rate 20, height  (1.803 m), weight 127.007 kg (280 lb), SpO2 100 %.  Intake/Output Summary (Last 24 hours) at 06/07/14 1609 Last data filed at 06/07/14 0857  Gross per 24 hour  Intake   2360 ml  Output   1775 ml  Net    585 ml    Filed Weights   06/05/14 1630  Weight: 127.007 kg (280 lb)    Exam Awake Alert, Oriented *3, No new F.N deficits, Normal affect [place].AT,PERRAL Supple Neck,No JVD, No cervical lymphadenopathy appriciated.  Symmetrical Chest wall movement, Good air movement bilaterally, CTAB RRR,No Gallops,Rubs or new Murmurs, No Parasternal Heave +ve B.Sounds, Abd Soft, Non tender, No organomegaly appriciated, No rebound -guarding or rigidity. No Cyanosis, Clubbing or edema, No new Rash or bruise  DISCHARGE CONDITION: Stable  DISPOSITION: Home  DISCHARGE INSTRUCTIONS:    Activity:  As tolerated  Diet recommendation: Regular Diet  Discharge Instructions    Diet general    Complete by:  As directed      Increase activity slowly    Complete by:  As directed            Follow-up Information    Follow  up with Pearson Grippe, MD On 06/13/2014.   Specialty:  Internal Medicine   Why:  keep your appt   Contact information:   [address] [place] [address] [phone]       Total Time spent on discharge equals 25  minutes.  SignedJeoffrey Massed 06/07/2014 4:09 PM

## 2014-06-07 NOTE — Progress Notes (Signed)
Patient discharged to home with family, discharge instructions reviewed with patient who verbalized understanding. New RX's given to patient. 

## 2014-06-08 LAB — GI PATHOGEN PANEL BY PCR, STOOL
C difficile toxin A/B: NOT DETECTED
Campylobacter by PCR: NOT DETECTED
Cryptosporidium by PCR: NOT DETECTED
E COLI (ETEC) LT/ST: NOT DETECTED
E COLI (STEC): NOT DETECTED
E COLI 0157 BY PCR: NOT DETECTED
G lamblia by PCR: NOT DETECTED
Norovirus GI/GII: NOT DETECTED
Rotavirus A by PCR: NOT DETECTED
SALMONELLA BY PCR: NOT DETECTED
SHIGELLA BY PCR: NOT DETECTED

## 2014-06-10 LAB — STOOL CULTURE: Special Requests: NORMAL

## 2014-06-14 ENCOUNTER — Emergency Department (HOSPITAL_COMMUNITY): Payer: BLUE CROSS/BLUE SHIELD

## 2014-06-14 ENCOUNTER — Encounter (HOSPITAL_COMMUNITY): Payer: Self-pay | Admitting: Emergency Medicine

## 2014-06-14 ENCOUNTER — Emergency Department (HOSPITAL_COMMUNITY)
Admission: EM | Admit: 2014-06-14 | Discharge: 2014-06-14 | Disposition: A | Discharge status: Home / Self Care | Payer: BLUE CROSS/BLUE SHIELD | Attending: Emergency Medicine | Admitting: Emergency Medicine

## 2014-06-14 DIAGNOSIS — Z87442 Personal history of urinary calculi: Secondary | ICD-10-CM | POA: Insufficient documentation

## 2014-06-14 DIAGNOSIS — R1110 Vomiting, unspecified: Secondary | ICD-10-CM | POA: Diagnosis not present

## 2014-06-14 DIAGNOSIS — Z720 Tobacco use: Secondary | ICD-10-CM | POA: Insufficient documentation

## 2014-06-14 DIAGNOSIS — Z8669 Personal history of other diseases of the nervous system and sense organs: Secondary | ICD-10-CM | POA: Insufficient documentation

## 2014-06-14 DIAGNOSIS — R197 Diarrhea, unspecified: Secondary | ICD-10-CM | POA: Insufficient documentation

## 2014-06-14 DIAGNOSIS — Z8739 Personal history of other diseases of the musculoskeletal system and connective tissue: Secondary | ICD-10-CM | POA: Diagnosis not present

## 2014-06-14 DIAGNOSIS — R1012 Left upper quadrant pain: Secondary | ICD-10-CM | POA: Diagnosis not present

## 2014-06-14 DIAGNOSIS — R300 Dysuria: Secondary | ICD-10-CM | POA: Diagnosis present

## 2014-06-14 LAB — URINALYSIS, ROUTINE W REFLEX MICROSCOPIC
BILIRUBIN URINE: NEGATIVE
GLUCOSE, UA: NEGATIVE mg/dL
HGB URINE DIPSTICK: NEGATIVE
Ketones, ur: >80 mg/dL — AB
Leukocytes, UA: NEGATIVE
Nitrite: NEGATIVE
PROTEIN: NEGATIVE mg/dL
Specific Gravity, Urine: 1.02 (ref 1.005–1.030)
Urobilinogen, UA: 0.2 mg/dL (ref 0.0–1.0)
pH: 7 (ref 5.0–8.0)

## 2014-06-14 LAB — CBC WITH DIFFERENTIAL/PLATELET
Basophils Absolute: 0 10*3/uL (ref 0.0–0.1)
Basophils Relative: 0 % (ref 0–1)
EOS PCT: 0 % (ref 0–5)
Eosinophils Absolute: 0 10*3/uL (ref 0.0–0.7)
HCT: 46.4 % (ref 39.0–52.0)
HEMOGLOBIN: 16.9 g/dL (ref 13.0–17.0)
LYMPHS ABS: 0.9 10*3/uL (ref 0.7–4.0)
Lymphocytes Relative: 7 % — ABNORMAL LOW (ref 12–46)
MCH: 32.8 pg (ref 26.0–34.0)
MCHC: 36.4 g/dL — ABNORMAL HIGH (ref 30.0–36.0)
MCV: 89.9 fL (ref 78.0–100.0)
MONOS PCT: 2 % — AB (ref 3–12)
Monocytes Absolute: 0.2 10*3/uL (ref 0.1–1.0)
Neutro Abs: 11.6 10*3/uL — ABNORMAL HIGH (ref 1.7–7.7)
Neutrophils Relative %: 91 % — ABNORMAL HIGH (ref 43–77)
Platelets: 260 10*3/uL (ref 150–400)
RBC: 5.16 MIL/uL (ref 4.22–5.81)
RDW: 11.8 % (ref 11.5–15.5)
WBC: 12.8 10*3/uL — ABNORMAL HIGH (ref 4.0–10.5)

## 2014-06-14 LAB — COMPREHENSIVE METABOLIC PANEL
ALT: 26 U/L (ref 17–63)
AST: 28 U/L (ref 15–41)
Albumin: 4.8 g/dL (ref 3.5–5.0)
Alkaline Phosphatase: 67 U/L (ref 38–126)
Anion gap: 13 (ref 5–15)
BUN: 17 mg/dL (ref 6–20)
CO2: 19 mmol/L — AB (ref 22–32)
Calcium: 9.6 mg/dL (ref 8.9–10.3)
Chloride: 108 mmol/L (ref 101–111)
Creatinine, Ser: 0.87 mg/dL (ref 0.61–1.24)
GFR calc non Af Amer: >60 mL/min (ref >60)
Glucose, Bld: 128 mg/dL — ABNORMAL HIGH (ref 65–99)
POTASSIUM: 4.1 mmol/L (ref 3.5–5.1)
SODIUM: 140 mmol/L (ref 135–145)
Total Bilirubin: 0.6 mg/dL (ref 0.3–1.2)
Total Protein: 8.1 g/dL (ref 6.5–8.1)

## 2014-06-14 LAB — LIPASE, BLOOD: Lipase: 14 U/L — ABNORMAL LOW (ref 22–51)

## 2014-06-14 MED ORDER — ONDANSETRON HCL 4 MG/2ML IJ SOLN
4.0000 mg | Freq: Once | INTRAMUSCULAR | Status: AC
  Administered 2014-06-14: 4 mg via INTRAVENOUS
  Filled 2014-06-14: qty 2

## 2014-06-14 MED ORDER — HYDROMORPHONE HCL 1 MG/ML IJ SOLN
0.5000 mg | Freq: Once | INTRAMUSCULAR | Status: AC
  Administered 2014-06-14: 0.5 mg via INTRAVENOUS
  Filled 2014-06-14: qty 1

## 2014-06-14 MED ORDER — ONDANSETRON 8 MG PO TBDP
8.0000 mg | ORAL_TABLET | Freq: Once | ORAL | Status: DC
  Filled 2014-06-14: qty 1

## 2014-06-14 MED ORDER — ONDANSETRON HCL 4 MG/2ML IJ SOLN: 4.0000 mg | Freq: Once | INTRAMUSCULAR | Status: AC

## 2014-06-14 MED ORDER — LORAZEPAM 2 MG/ML IJ SOLN
1.0000 mg | Freq: Once | INTRAMUSCULAR | Status: AC
  Administered 2014-06-14: 1 mg via INTRAVENOUS
  Filled 2014-06-14: qty 1

## 2014-06-14 MED ORDER — SODIUM CHLORIDE 0.9 % IV BOLUS (SEPSIS)
500.0000 mL | Freq: Once | INTRAVENOUS | Status: AC
  Administered 2014-06-14: 500 mL via INTRAVENOUS

## 2014-06-14 NOTE — ED Notes (Signed)
Pt c/o 10/10 RLQ cramping abdominal pain, emesis, dysuria, nausea onset this morning at 0730. Pt states he was seen in ED for same recently, given tentative Dx of kidney stones.

## 2014-06-14 NOTE — ED Notes (Signed)
Patient is still unable to urinate at this time.  

## 2014-06-14 NOTE — ED Notes (Signed)
Bed: [facility] Expected date:  Expected time:  Means of arrival:  Comments: EMS 

## 2014-06-14 NOTE — ED Provider Notes (Signed)
CSN: [id number]     Arrival date & time 06/14/14  1418 History   First MD Initiated Contact with Patient 06/14/14 1552     Chief Complaint  Patient presents with  . Abdominal Pain  . Dysuria     (Consider location/radiation/quality/duration/timing/severity/associated sxs/prior Treatment) Patient is a 39 y.o. male presenting with abdominal pain and dysuria. The history is provided by the patient. No language interpreter was used.  Abdominal Pain Associated symptoms: dysuria and hematuria   Associated symptoms: no constipation   Dysuria Associated symptoms include abdominal pain. Pertinent negatives include no weakness.  Barry Parsons is a 39 y.o male with a history of gastric banding and kidney stones who presents with LLQ pain that awoke him from sleep at 7am today. He states, "if feels like someone is kicking me in the abdomen." He has also had several episodes of vomiting and diarrhea since this morning. His last episode of runny diarrhea was in the ED lobby. He states he normally takes tramadol for pain but only took zofran today. He states he was seen for the same last week and admitted for intractable vomiting and abdominal pain.  They could not find the cause of his pain but he believes it is kidney stones since he has had them in the past. He states he has been feeling cold and hot.  He denies any headache, chest pain, shortness of breath, or constipation.  Past Medical History  Diagnosis Date  . Weight increase   . Sleep difficulties   . Arthritis   . Kidney stones    Past Surgical History  Procedure Laterality Date  . Vasectomy  2009  . Tonsillectomy  2011  . 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 Tonsillectomy     Family History  Problem Relation Age of Onset  . Heart disease Mother   . Kidney Stones    . Gallstones     History  Substance Use Topics  . Smoking status: Current Every Day Smoker -- 0.25 packs/day    Types: Cigarettes    Last Attempt to Quit:  01/27/2008  . Smokeless tobacco: Never Used  . Alcohol Use: Yes     Comment: occasional    Review of Systems  Gastrointestinal: Positive for abdominal pain. Negative for constipation.  Genitourinary: Positive for dysuria and hematuria.  Neurological: Negative for dizziness, syncope and weakness.      Allergies  Review of patient's allergies indicates no known allergies.  Home Medications   Prior to Admission medications   Medication Sig Start Date End Date Taking? Authorizing Provider  ondansetron (ZOFRAN ODT) 4 MG disintegrating tablet 4mg  ODT q4 hours prn nausea/vomiting 06/07/14  Yes Shanker Levora DredgeM Ghimire, MD  traMADol (ULTRAM) 50 MG tablet Take 50 mg by mouth every 8 (eight) hours as needed for moderate pain or severe pain (pain).  07/03/10  Yes Historical Provider, MD  traZODone (DESYREL) 50 MG tablet Take 1 tablet by mouth at bedtime as needed for sleep (sleep).  04/30/14  Yes Historical Provider, MD  ciprofloxacin (CIPRO) 500 MG tablet Take 1 tablet (500 mg total) by mouth 2 (two) times daily. Patient not taking: Reported on 06/14/2014 06/07/14   Maretta BeesShanker M Ghimire, MD   BP 145/70 mmHg  Pulse 71  Temp(Src) 97.8 F (36.6 C) (Oral)  Resp 18  SpO2 99% Physical Exam  Constitutional: He is oriented to person, place, and time. He appears well-developed and well-nourished.  HENT:  Head: Normocephalic and atraumatic.  Eyes: Conjunctivae are normal.  Neck: Normal range of motion. Neck supple.  Cardiovascular: Normal rate, regular rhythm and normal heart sounds.   Pulmonary/Chest: Effort normal and breath sounds normal.  Abdominal: Soft. Normal appearance. He exhibits no distension and no mass. There is no tenderness. There is no rebound and no guarding.    Musculoskeletal: Normal range of motion.  Neurological: He is alert and oriented to person, place, and time.  Skin: Skin is warm and dry.  Nursing note and vitals reviewed.   ED Course  Procedures (including critical care  time) Labs Review Labs Reviewed  CBC WITH DIFFERENTIAL/PLATELET - Abnormal; Notable for the following:    WBC 12.8 (*)    MCHC 36.4 (*)    Neutrophils Relative % 91 (*)    Neutro Abs 11.6 (*)    Lymphocytes Relative 7 (*)    Monocytes Relative 2 (*)    All other components within normal limits  COMPREHENSIVE METABOLIC PANEL - Abnormal; Notable for the following:    CO2 19 (*)    Glucose, Bld 128 (*)    All other components within normal limits  LIPASE, BLOOD - Abnormal; Notable for the following:    Lipase 14 (*)    All other components within normal limits  URINALYSIS, ROUTINE W REFLEX MICROSCOPIC - Abnormal; Notable for the following:    Ketones, ur >80 (*)    All other components within normal limits    Imaging Review Dg Abd 1 View  06/14/2014   CLINICAL DATA:  LEFT side abdominal pain and nausea beginning this morning, recent discharge from hospital due to kidney stones, prior laparoscopic gastric banding  EXAM: ABDOMEN - 1 VIEW  COMPARISON:  CT abdomen and pelvis 06/05/2014  FINDINGS: Laparoscopic gastric band appears rotated in an AP direction though the band had normal orientation on recent CT, uncertain if this represents a projectional artifact or interval change from recent CT.  Gaseous distention of distal stomach.  Visualized lung bases clear.  Paucity of gas in remaining bowel.  Scattered pelvic phleboliths.  LEFT renal calculus identified on prior CT is not definitely visualized.  No acute osseous findings.  IMPRESSION: Laparoscopic gastric band appears row pack aided in AP plane though the band head normal orientation on recent CT, question projectional artifact versus interval change.  Mild gaseous distention of the distal stomach.  Otherwise normal bowel gas pattern.   Electronically Signed   By: Ulyses SouthwardMark  Boles M.D.   On: 06/14/2014 20:00     EKG Interpretation None      MDM   Final diagnoses:  Left upper quadrant pain  Patient with history of gastric banding and  kidney stones presents for LLQ abdominal pain and left scrotal pain.  He was seen for the same on 5/10. He states that he woke up with LLQ abdominal pain this morning with nausea, vomiting, and diarrhea.   His labs are unremarkable and his vitals are stable. His KUB has a normal bowel gas pattern with no kidney stones visualized.  I discussed this patient with Dr. Silverio LayYao.  17:15 Patient states he is still in pain and nauseated.  Patient was admitted on 06/05/14 and discharged on 06/07/14 with a negative workup.  He was sent home with Toradol and zofran. Patient has a recent CT of the abdomen which did now show any diverticulitis, hydronephrosis, kidney stones, colitis, appendicitis or any other concerning etiology. He also has a normal scrotal US. He has requested narcotic medications multiple times.  Medications  ondansetron (ZOFRAN-ODT) disintegrating tablet  8 mg (8 mg Oral Not Given 06/14/14 1431)  ondansetron (ZOFRAN) injection 4 mg (4 mg Intravenous Given 06/14/14 1647)  HYDROmorphone (DILAUDID) injection 0.5 mg (0.5 mg Intravenous Given 06/14/14 1647)  sodium chloride 0.9 % bolus 500 mL (0 mLs Intravenous Stopped 06/14/14 2032)  ondansetron (ZOFRAN) injection 4 mg (4 mg Intravenous Given 06/14/14 1825)  ondansetron (ZOFRAN) injection 4 mg (0 mg Intravenous Duplicate 06/14/14 1830)  LORazepam (ATIVAN) injection 1 mg (1 mg Intravenous Given 06/14/14 1900)  sodium chloride 0.9 % bolus 500 mL (0 mLs Intravenous Stopped 06/14/14 2032)  HYDROmorphone (DILAUDID) injection 0.5 mg (0.5 mg Intravenous Given 06/14/14 1938)  HYDROmorphone (DILAUDID) injection 0.5 mg (0.5 mg Intravenous Given 06/14/14 2001)  He has not vomited in the ED. After multiple rounds of medication and no significant findings the plan is to discharge the patient home with no additional narcotics. He can follow up with his pcp.      Catha Gosselin, PA-C 06/14/14 2138  Richardean Canal, MD 06/16/14 [phone]

## 2014-06-14 NOTE — ED Notes (Signed)
Patient is unable to urinate at this time.... He states he is in too much pain and unable to move and wants fluids.

## 2014-06-14 NOTE — ED Notes (Signed)
Called patient for triage, pt in bathroom. Unable to triage at this time.

## 2014-06-14 NOTE — ED Notes (Signed)
Patient is unable to urinate at this time 

## 2014-06-14 NOTE — Discharge Instructions (Signed)
Abdominal Pain Follow up with your primary care provider. Take tramadol and zofran for pain and nausea.   Many things can cause abdominal pain. Usually, abdominal pain is not caused by a disease and will improve without treatment. It can often be observed and treated at home. Your health care provider will do a physical exam and possibly order blood tests and X-rays to help determine the seriousness of your pain. However, in many cases, more time must pass before a clear cause of the pain can be found. Before that point, your health care provider may not know if you need more testing or further treatment. HOME CARE INSTRUCTIONS  Monitor your abdominal pain for any changes. The following actions may help to alleviate any discomfort you are experiencing:  Only take over-the-counter or prescription medicines as directed by your health care provider.  Do not take laxatives unless directed to do so by your health care provider.  Try a clear liquid diet (broth, tea, or water) as directed by your health care provider. Slowly move to a bland diet as tolerated. SEEK MEDICAL CARE IF:  You have unexplained abdominal pain.  You have abdominal pain associated with nausea or diarrhea.  You have pain when you urinate or have a bowel movement.  You experience abdominal pain that wakes you in the night.  You have abdominal pain that is worsened or improved by eating food.  You have abdominal pain that is worsened with eating fatty foods.  You have a fever. SEEK IMMEDIATE MEDICAL CARE IF:   Your pain does not go away within 2 hours.  You keep throwing up (vomiting).  Your pain is felt only in portions of the abdomen, such as the right side or the left lower portion of the abdomen.  You pass bloody or black tarry stools. MAKE SURE YOU:  Understand these instructions.   Will watch your condition.   Will get help right away if you are not doing well or get worse.  Document Released:  10/22/2004 Document Revised: 01/17/2013 Document Reviewed: 09/21/2012 [facility] Patient Information 2015 [place], [place]. This information is not intended to replace advice given to you by your health care provider. Make sure you discuss any questions you have with your health care provider.

## 2014-07-18 ENCOUNTER — Ambulatory Visit (HOSPITAL_COMMUNITY)
Admission: RE | Admit: 2014-07-18 | Discharge: 2014-07-18 | Disposition: A | Discharge status: Home / Self Care | Payer: BLUE CROSS/BLUE SHIELD | Source: Ambulatory Visit | Attending: Internal Medicine | Admitting: Internal Medicine

## 2014-07-18 ENCOUNTER — Other Ambulatory Visit (HOSPITAL_COMMUNITY): Payer: Self-pay | Admitting: Internal Medicine

## 2014-07-18 ENCOUNTER — Encounter (HOSPITAL_COMMUNITY): Payer: Self-pay

## 2014-07-18 DIAGNOSIS — R197 Diarrhea, unspecified: Secondary | ICD-10-CM

## 2014-07-18 DIAGNOSIS — R112 Nausea with vomiting, unspecified: Secondary | ICD-10-CM

## 2014-07-18 DIAGNOSIS — R1032 Left lower quadrant pain: Secondary | ICD-10-CM

## 2014-07-18 DIAGNOSIS — K760 Fatty (change of) liver, not elsewhere classified: Secondary | ICD-10-CM | POA: Insufficient documentation

## 2014-07-18 DIAGNOSIS — K5730 Diverticulosis of large intestine without perforation or abscess without bleeding: Secondary | ICD-10-CM | POA: Diagnosis not present

## 2014-07-18 MED ORDER — IOHEXOL 300 MG/ML  SOLN
100.0000 mL | Freq: Once | INTRAMUSCULAR | Status: AC | PRN
  Administered 2014-07-18: 100 mL via INTRAVENOUS

## 2014-09-12 ENCOUNTER — Other Ambulatory Visit: Payer: Self-pay | Admitting: Gastroenterology

## 2014-09-12 DIAGNOSIS — R1032 Left lower quadrant pain: Secondary | ICD-10-CM

## 2014-09-13 ENCOUNTER — Ambulatory Visit
Admission: RE | Admit: 2014-09-13 | Discharge: 2014-09-13 | Disposition: A | Discharge status: Home / Self Care | Payer: BLUE CROSS/BLUE SHIELD | Source: Ambulatory Visit | Attending: Gastroenterology | Admitting: Gastroenterology

## 2014-09-13 DIAGNOSIS — R1032 Left lower quadrant pain: Secondary | ICD-10-CM

## 2014-09-13 MED ORDER — IOPAMIDOL (ISOVUE-300) INJECTION 61%
125.0000 mL | Freq: Once | INTRAVENOUS | Status: AC | PRN
  Administered 2014-09-13: 125 mL via INTRAVENOUS

## 2014-09-23 ENCOUNTER — Emergency Department (HOSPITAL_COMMUNITY): Payer: BLUE CROSS/BLUE SHIELD

## 2014-09-23 ENCOUNTER — Encounter (HOSPITAL_COMMUNITY): Payer: Self-pay

## 2014-09-23 ENCOUNTER — Emergency Department (HOSPITAL_COMMUNITY)
Admission: EM | Admit: 2014-09-23 | Discharge: 2014-09-23 | Disposition: A | Discharge status: Home / Self Care | Payer: BLUE CROSS/BLUE SHIELD | Attending: Emergency Medicine | Admitting: Emergency Medicine

## 2014-09-23 DIAGNOSIS — K529 Noninfective gastroenteritis and colitis, unspecified: Secondary | ICD-10-CM | POA: Diagnosis not present

## 2014-09-23 DIAGNOSIS — K8018 Calculus of gallbladder with other cholecystitis without obstruction: Secondary | ICD-10-CM | POA: Insufficient documentation

## 2014-09-23 DIAGNOSIS — Z8739 Personal history of other diseases of the musculoskeletal system and connective tissue: Secondary | ICD-10-CM | POA: Diagnosis not present

## 2014-09-23 DIAGNOSIS — R1032 Left lower quadrant pain: Secondary | ICD-10-CM

## 2014-09-23 DIAGNOSIS — Z720 Tobacco use: Secondary | ICD-10-CM | POA: Diagnosis not present

## 2014-09-23 DIAGNOSIS — R739 Hyperglycemia, unspecified: Secondary | ICD-10-CM

## 2014-09-23 DIAGNOSIS — K5730 Diverticulosis of large intestine without perforation or abscess without bleeding: Secondary | ICD-10-CM | POA: Diagnosis not present

## 2014-09-23 DIAGNOSIS — R197 Diarrhea, unspecified: Secondary | ICD-10-CM

## 2014-09-23 DIAGNOSIS — R112 Nausea with vomiting, unspecified: Secondary | ICD-10-CM

## 2014-09-23 DIAGNOSIS — K8010 Calculus of gallbladder with chronic cholecystitis without obstruction: Secondary | ICD-10-CM

## 2014-09-23 LAB — CBC
HEMATOCRIT: 43.2 % (ref 39.0–52.0)
HEMOGLOBIN: 15.3 g/dL (ref 13.0–17.0)
MCH: 32.4 pg (ref 26.0–34.0)
MCHC: 35.4 g/dL (ref 30.0–36.0)
MCV: 91.5 fL (ref 78.0–100.0)
Platelets: 256 10*3/uL (ref 150–400)
RBC: 4.72 MIL/uL (ref 4.22–5.81)
RDW: 12.2 % (ref 11.5–15.5)
WBC: 13.7 10*3/uL — AB (ref 4.0–10.5)

## 2014-09-23 LAB — LIPASE, BLOOD: Lipase: 11 U/L — ABNORMAL LOW (ref 22–51)

## 2014-09-23 LAB — COMPREHENSIVE METABOLIC PANEL
ALBUMIN: 4.7 g/dL (ref 3.5–5.0)
ALT: 22 U/L (ref 17–63)
ANION GAP: 8 (ref 5–15)
AST: 28 U/L (ref 15–41)
Alkaline Phosphatase: 66 U/L (ref 38–126)
BUN: 8 mg/dL (ref 6–20)
CO2: 22 mmol/L (ref 22–32)
Calcium: 9.3 mg/dL (ref 8.9–10.3)
Chloride: 111 mmol/L (ref 101–111)
Creatinine, Ser: 0.66 mg/dL (ref 0.61–1.24)
GFR calc Af Amer: >60 mL/min (ref >60)
Glucose, Bld: 152 mg/dL — ABNORMAL HIGH (ref 65–99)
POTASSIUM: 3.6 mmol/L (ref 3.5–5.1)
Sodium: 141 mmol/L (ref 135–145)
Total Bilirubin: 0.5 mg/dL (ref 0.3–1.2)
Total Protein: 7.7 g/dL (ref 6.5–8.1)

## 2014-09-23 LAB — DIFFERENTIAL
BASOS ABS: 0 10*3/uL (ref 0.0–0.1)
Basophils Relative: 0 % (ref 0–1)
EOS ABS: 0 10*3/uL (ref 0.0–0.7)
Eosinophils Relative: 0 % (ref 0–5)
LYMPHS ABS: 0.5 10*3/uL — AB (ref 0.7–4.0)
LYMPHS PCT: 4 % — AB (ref 12–46)
Monocytes Absolute: 0.2 10*3/uL (ref 0.1–1.0)
Monocytes Relative: 2 % — ABNORMAL LOW (ref 3–12)
NEUTROS PCT: 94 % — AB (ref 43–77)
Neutro Abs: 12.8 10*3/uL — ABNORMAL HIGH (ref 1.7–7.7)

## 2014-09-23 LAB — URINALYSIS, ROUTINE W REFLEX MICROSCOPIC
Bilirubin Urine: NEGATIVE
Glucose, UA: NEGATIVE mg/dL
Hgb urine dipstick: NEGATIVE
Ketones, ur: NEGATIVE mg/dL
Leukocytes, UA: NEGATIVE
NITRITE: NEGATIVE
Protein, ur: NEGATIVE mg/dL
SPECIFIC GRAVITY, URINE: 1.03 (ref 1.005–1.030)
UROBILINOGEN UA: 0.2 mg/dL (ref 0.0–1.0)
pH: 8 (ref 5.0–8.0)

## 2014-09-23 MED ORDER — LORAZEPAM 2 MG/ML IJ SOLN
1.0000 mg | Freq: Once | INTRAMUSCULAR | Status: AC
  Administered 2014-09-23: 1 mg via INTRAVENOUS
  Filled 2014-09-23: qty 1

## 2014-09-23 MED ORDER — HYDROMORPHONE HCL 1 MG/ML IJ SOLN
1.0000 mg | Freq: Once | INTRAMUSCULAR | Status: AC
  Administered 2014-09-23: 1 mg via INTRAVENOUS
  Filled 2014-09-23: qty 1

## 2014-09-23 MED ORDER — ONDANSETRON HCL 4 MG/2ML IJ SOLN
4.0000 mg | Freq: Once | INTRAMUSCULAR | Status: AC
  Administered 2014-09-23: 4 mg via INTRAVENOUS
  Filled 2014-09-23: qty 2

## 2014-09-23 MED ORDER — SODIUM CHLORIDE 0.9 % IV BOLUS (SEPSIS)
1000.0000 mL | Freq: Once | INTRAVENOUS | Status: AC
  Administered 2014-09-23: 1000 mL via INTRAVENOUS

## 2014-09-23 MED ORDER — ONDANSETRON HCL 8 MG PO TABS: 8.0000 mg | ORAL_TABLET | Freq: Three times a day (TID) | ORAL | Status: DC | PRN

## 2014-09-23 MED ORDER — IOHEXOL 300 MG/ML  SOLN
100.0000 mL | Freq: Once | INTRAMUSCULAR | Status: AC | PRN
  Administered 2014-09-23: 100 mL via INTRAVENOUS

## 2014-09-23 MED ORDER — MORPHINE SULFATE (PF) 4 MG/ML IV SOLN
4.0000 mg | Freq: Once | INTRAVENOUS | Status: AC
  Administered 2014-09-23: 4 mg via INTRAVENOUS
  Filled 2014-09-23: qty 1

## 2014-09-23 NOTE — Discharge Instructions (Signed)
Use zofran as prescribed, as needed for nausea. Stay well hydrated with small sips of fluids throughout the day. Follow a BRAT (banana-rice-applesauce-toast) diet as described below for the next 24-48 hours. The 'BRAT' diet is suggested, then progress to diet as tolerated as symptoms abate. Call your doctor if bloody stools, persistent diarrhea, vomiting, fever or abdominal pain. Use your home pain medications as needed for pain. Follow up with your regular doctor and gastroenterologist in 5-7 days for recheck. Return to ER for changing or worsening of symptoms.  Food Choices to Help Relieve Diarrhea When you have diarrhea, the foods you eat and your eating habits are very important. Choosing the right foods and drinks can help relieve diarrhea. Also, because diarrhea can last up to 7 days, you need to replace lost fluids and electrolytes (such as sodium, potassium, and chloride) in order to help prevent dehydration.  WHAT GENERAL GUIDELINES DO I NEED TO FOLLOW?  Slowly drink 1 cup (8 oz) of fluid for each episode of diarrhea. If you are getting enough fluid, your urine will be clear or pale yellow.  Eat starchy foods. Some good choices include white rice, white toast, pasta, low-fiber cereal, baked potatoes (without the skin), saltine crackers, and bagels.  Avoid large servings of any cooked vegetables.  Limit fruit to two servings per day. A serving is  cup or 1 small piece.  Choose foods with less than 2 g of fiber per serving.  Limit fats to less than 8 tsp (38 g) per day.  Avoid fried foods.  Eat foods that have probiotics in them. Probiotics can be found in certain dairy products.  Avoid foods and beverages that may increase the speed at which food moves through the stomach and intestines (gastrointestinal tract). Things to avoid include:  High-fiber foods, such as dried fruit, raw fruits and vegetables, nuts, seeds, and whole grain foods.  Spicy foods and high-fat foods.  Foods  and beverages sweetened with high-fructose corn syrup, honey, or sugar alcohols such as xylitol, sorbitol, and mannitol. WHAT FOODS ARE RECOMMENDED? Grains White rice. White, [place], or pita breads (fresh or toasted), including plain rolls, buns, or bagels. White pasta. Saltine, soda, or graham crackers. Pretzels. Low-fiber cereal. Cooked cereals made with water (such as cornmeal, farina, or cream cereals). Plain muffins. Matzo. Melba toast. Zwieback.  Vegetables Potatoes (without the skin). Strained tomato and vegetable juices. Most well-cooked and canned vegetables without seeds. Tender lettuce. Fruits Cooked or canned applesauce, apricots, cherries, fruit cocktail, grapefruit, peaches, pears, or plums. Fresh bananas, apples without skin, cherries, grapes, cantaloupe, grapefruit, peaches, oranges, or plums.  Meat and Other Protein Products Baked or boiled chicken. Eggs. Tofu. Fish. Seafood. Smooth peanut butter. Ground or well-cooked tender beef, ham, veal, lamb, pork, or poultry.  Dairy Plain yogurt, kefir, and unsweetened liquid yogurt. Lactose-free milk, buttermilk, or soy milk. Plain hard cheese. Beverages Sport drinks. Clear broths. Diluted fruit juices (except prune). Regular, caffeine-free sodas such as ginger ale. Water. Decaffeinated teas. Oral rehydration solutions. Sugar-free beverages not sweetened with sugar alcohols. Other Bouillon, broth, or soups made from recommended foods.  The items listed above may not be a complete list of recommended foods or beverages. Contact your dietitian for more options. WHAT FOODS ARE NOT RECOMMENDED? Grains Whole grain, whole wheat, bran, or rye breads, rolls, pastas, crackers, and cereals. Wild or brown rice. Cereals that contain more than 2 g of fiber per serving. Corn tortillas or taco shells. Cooked or dry oatmeal. Granola. Popcorn. Vegetables Raw vegetables.  Cabbage, broccoli, Brussels sprouts, artichokes, baked beans, beet greens, corn,  kale, legumes, peas, sweet potatoes, and yams. Potato skins. Cooked spinach and cabbage. Fruits Dried fruit, including raisins and dates. Raw fruits. Stewed or dried prunes. Fresh apples with skin, apricots, mangoes, pears, raspberries, and strawberries.  Meat and Other Protein Products Chunky peanut butter. Nuts and seeds. Beans and lentils. Tomasa Blase.  Dairy High-fat cheeses. Milk, chocolate milk, and beverages made with milk, such as milk shakes. Cream. Ice cream. Sweets and Desserts Sweet rolls, doughnuts, and sweet breads. Pancakes and waffles. Fats and Oils Butter. Cream sauces. Margarine. Salad oils. Plain salad dressings. Olives. Avocados.  Beverages Caffeinated beverages (such as coffee, tea, soda, or energy drinks). Alcoholic beverages. Fruit juices with pulp. Prune juice. Soft drinks sweetened with high-fructose corn syrup or sugar alcohols. Other Coconut. Hot sauce. Chili powder. Mayonnaise. Gravy. Cream-based or milk-based soups.  The items listed above may not be a complete list of foods and beverages to avoid. Contact your dietitian for more information. WHAT SHOULD I DO IF I BECOME DEHYDRATED? Diarrhea can sometimes lead to dehydration. Signs of dehydration include dark urine and dry mouth and skin. If you think you are dehydrated, you should rehydrate with an oral rehydration solution. These solutions can be purchased at pharmacies, retail stores, or online.  Drink -1 cup (120-240 mL) of oral rehydration solution each time you have an episode of diarrhea. If drinking this amount makes your diarrhea worse, try drinking smaller amounts more often. For example, drink 1-3 tsp (5-15 mL) every 5-10 minutes.  A general rule for staying hydrated is to drink 1-2 L of fluid per day. Talk to your health care provider about the specific amount you should be drinking each day. Drink enough fluids to keep your urine clear or pale yellow. Document Released: 04/04/2003 Document Revised: 01/17/2013  Document Reviewed: 12/05/2012 [facility] Patient Information 2015 [place], [place]. This information is not intended to replace advice given to you by your health care provider. Make sure you discuss any questions you have with your health care provider.   Abdominal Pain Many things can cause belly (abdominal) pain. Most times, the belly pain is not dangerous. Many cases of belly pain can be watched and treated at home. HOME CARE   Do not take medicines that help you go poop (laxatives) unless told to by your doctor.  Only take medicine as told by your doctor.  Eat or drink as told by your doctor. Your doctor will tell you if you should be on a special diet. GET HELP IF:  You do not know what is causing your belly pain.  You have belly pain while you are sick to your stomach (nauseous) or have runny poop (diarrhea).  You have pain while you pee or poop.  Your belly pain wakes you up at night.  You have belly pain that gets worse or better when you eat.  You have belly pain that gets worse when you eat fatty foods.  You have a fever. GET HELP RIGHT AWAY IF:   The pain does not go away within 2 hours.  You keep throwing up (vomiting).  The pain changes and is only in the right or left part of the belly.  You have bloody or tarry looking poop. MAKE SURE YOU:   Understand these instructions.  Will watch your condition.  Will get help right away if you are not doing well or get worse. Document Released: 07/01/2007 Document Revised: 01/17/2013 Document Reviewed: 09/21/2012 [employer]  Patient Information 2015 [place], [place]. This information is not intended to replace advice given to you by your health care provider. Make sure you discuss any questions you have with your health care provider.  Diarrhea Diarrhea is watery poop (stool). It can make you feel weak, tired, thirsty, or give you a dry mouth (signs of dehydration). Watery poop is a sign of another problem, most often an  infection. It often lasts 2-3 days. It can last longer if it is a sign of something serious. Take care of yourself as told by your doctor. HOME CARE   Drink 1 cup (8 ounces) of fluid each time you have watery poop.  Do not drink the following fluids:  Those that contain simple sugars (fructose, glucose, galactose, lactose, sucrose, maltose).  Sports drinks.  Fruit juices.  Whole milk products.  Sodas.  Drinks with caffeine (coffee, tea, soda) or alcohol.  Oral rehydration solution may be used if the doctor says it is okay. You may make your own solution. Follow this recipe:   - teaspoon table salt.   teaspoon baking soda.   teaspoon salt substitute containing potassium chloride.  1 tablespoons sugar.  1 liter (34 ounces) of water.  Avoid the following foods:  High fiber foods, such as raw fruits and vegetables.  Nuts, seeds, and whole grain breads and cereals.   Those that are sweetened with sugar alcohols (xylitol, sorbitol, mannitol).  Try eating the following foods:  Starchy foods, such as rice, toast, pasta, low-sugar cereal, oatmeal, baked potatoes, crackers, and bagels.  Bananas.  Applesauce.  Eat probiotic-rich foods, such as yogurt and milk products that are fermented.  Wash your hands well after each time you have watery poop.  Only take medicine as told by your doctor.  Take a warm bath to help lessen burning or pain from having watery poop. GET HELP RIGHT AWAY IF:   You cannot drink fluids without throwing up (vomiting).  You keep throwing up.  You have blood in your poop, or your poop looks black and tarry.  You do not pee (urinate) in 6-8 hours, or there is only a small amount of very dark pee.  You have belly (abdominal) pain that gets worse or stays in the same spot (localizes).  You are weak, dizzy, confused, or light-headed.  You have a very bad headache.  Your watery poop gets worse or does not get better.  You have a fever or  lasting symptoms for more than 2-3 days.  You have a fever and your symptoms suddenly get worse. MAKE SURE YOU:   Understand these instructions.  Will watch your condition.  Will get help right away if you are not doing well or get worse. Document Released: 07/01/2007 Document Revised: 05/29/2013 Document Reviewed: 09/20/2011 [facility] Patient Information 2015 [place], [place]. This information is not intended to replace advice given to you by your health care provider. Make sure you discuss any questions you have with your health care provider.  Diverticulosis Diverticulosis is the condition that develops when small pouches (diverticula) form in the wall of your colon. Your colon, or large intestine, is where water is absorbed and stool is formed. The pouches form when the inside layer of your colon pushes through weak spots in the outer layers of your colon. CAUSES  No one knows exactly what causes diverticulosis. RISK FACTORS  Being older than 50. Your risk for this condition increases with age. Diverticulosis is rare in people younger than 40 years. By age  80, almost everyone has it.  Eating a low-fiber diet.  Being frequently constipated.  Being overweight.  Not getting enough exercise.  Smoking.  Taking over-the-counter pain medicines, like aspirin and ibuprofen. SYMPTOMS  Most people with diverticulosis do not have symptoms. DIAGNOSIS  Because diverticulosis often has no symptoms, health care providers often discover the condition during an exam for other colon problems. In many cases, a health care provider will diagnose diverticulosis while using a flexible scope to examine the colon (colonoscopy). TREATMENT  If you have never developed an infection related to diverticulosis, you may not need treatment. If you have had an infection before, treatment may include:  Eating more fruits, vegetables, and grains.  Taking a fiber supplement.  Taking a live bacteria  supplement (probiotic).  Taking medicine to relax your colon. HOME CARE INSTRUCTIONS   Drink at least 6-8 glasses of water each day to prevent constipation.  Try not to strain when you have a bowel movement.  Keep all follow-up appointments. If you have had an infection before:  Increase the fiber in your diet as directed by your health care provider or dietitian.  Take a dietary fiber supplement if your health care provider approves.  Only take medicines as directed by your health care provider. SEEK MEDICAL CARE IF:   You have abdominal pain.  You have bloating.  You have cramps.  You have not gone to the bathroom in 3 days. SEEK IMMEDIATE MEDICAL CARE IF:   Your pain gets worse.  Yourbloating becomes very bad.  You have a fever or chills, and your symptoms suddenly get worse.  You begin vomiting.  You have bowel movements that are bloody or black. MAKE SURE YOU:  Understand these instructions.  Will watch your condition.  Will get help right away if you are not doing well or get worse. Document Released: 10/10/2003 Document Revised: 01/17/2013 Document Reviewed: 12/07/2012 [facility] Patient Information 2015 [place], [place]. This information is not intended to replace advice given to you by your health care provider. Make sure you discuss any questions you have with your health care provider.  Nausea and Vomiting Nausea means you feel sick to your stomach. Throwing up (vomiting) is a reflex where stomach contents come out of your mouth. HOME CARE   Take medicine as told by your doctor.  Do not force yourself to eat. However, you do need to drink fluids.  If you feel like eating, eat a normal diet as told by your doctor.  Eat rice, wheat, potatoes, bread, lean meats, yogurt, fruits, and vegetables.  Avoid high-fat foods.  Drink enough fluids to keep your pee (urine) clear or pale yellow.  Ask your doctor how to replace body fluid losses (rehydrate).  Signs of body fluid loss (dehydration) include:  Feeling very thirsty.  Dry lips and mouth.  Feeling dizzy.  Dark pee.  Peeing less than normal.  Feeling confused.  Fast breathing or heart rate. GET HELP RIGHT AWAY IF:   You have blood in your throw up.  You have black or bloody poop (stool).  You have a bad headache or stiff neck.  You feel confused.  You have bad belly (abdominal) pain.  You have chest pain or trouble breathing.  You do not pee at least once every 8 hours.  You have cold, clammy skin.  You keep throwing up after 24 to 48 hours.  You have a fever. MAKE SURE YOU:   Understand these instructions.  Will watch your condition.  Will get help right away if you are not doing well or get worse. Document Released: 07/01/2007 Document Revised: 04/06/2011 Document Reviewed: 06/13/2010 [facility] Patient Information 2015 [place], [place]. This information is not intended to replace advice given to you by your health care provider. Make sure you discuss any questions you have with your health care provider.  Viral Gastroenteritis Viral gastroenteritis is also known as stomach flu. This condition affects the stomach and intestinal tract. It can cause sudden diarrhea and vomiting. The illness typically lasts 3 to 8 days. Most people develop an immune response that eventually gets rid of the virus. While this natural response develops, the virus can make you quite ill. CAUSES  Many different viruses can cause gastroenteritis, such as rotavirus or noroviruses. You can catch one of these viruses by consuming contaminated food or water. You may also catch a virus by sharing utensils or other personal items with an infected person or by touching a contaminated surface. SYMPTOMS  The most common symptoms are diarrhea and vomiting. These problems can cause a severe loss of body fluids (dehydration) and a body salt (electrolyte) imbalance. Other symptoms may  include:  Fever.  Headache.  Fatigue.  Abdominal pain. DIAGNOSIS  Your caregiver can usually diagnose viral gastroenteritis based on your symptoms and a physical exam. A stool sample may also be taken to test for the presence of viruses or other infections. TREATMENT  This illness typically goes away on its own. Treatments are aimed at rehydration. The most serious cases of viral gastroenteritis involve vomiting so severely that you are not able to keep fluids down. In these cases, fluids must be given through an intravenous line (IV). HOME CARE INSTRUCTIONS   Drink enough fluids to keep your urine clear or pale yellow. Drink small amounts of fluids frequently and increase the amounts as tolerated.  Ask your caregiver for specific rehydration instructions.  Avoid:  Foods high in sugar.  Alcohol.  Carbonated drinks.  Tobacco.  Juice.  Caffeine drinks.  Extremely hot or cold fluids.  Fatty, greasy foods.  Too much intake of anything at one time.  Dairy products until 24 to 48 hours after diarrhea stops.  You may consume probiotics. Probiotics are active cultures of beneficial bacteria. They may lessen the amount and number of diarrheal stools in adults. Probiotics can be found in yogurt with active cultures and in supplements.  Wash your hands well to avoid spreading the virus.  Only take over-the-counter or prescription medicines for pain, discomfort, or fever as directed by your caregiver. Do not give aspirin to children. Antidiarrheal medicines are not recommended.  Ask your caregiver if you should continue to take your regular prescribed and over-the-counter medicines.  Keep all follow-up appointments as directed by your caregiver. SEEK IMMEDIATE MEDICAL CARE IF:   You are unable to keep fluids down.  You do not urinate at least once every 6 to 8 hours.  You develop shortness of breath.  You notice blood in your stool or vomit. This may look like coffee  grounds.  You have abdominal pain that increases or is concentrated in one small area (localized).  You have persistent vomiting or diarrhea.  You have a fever.  The patient is a child younger than 3 months, and he or she has a fever.  The patient is a child older than 3 months, and he or she has a fever and persistent symptoms.  The patient is a child older than 3 months, and he or she  has a fever and symptoms suddenly get worse.  The patient is a baby, and he or she has no tears when crying. MAKE SURE YOU:   Understand these instructions.  Will watch your condition.  Will get help right away if you are not doing well or get worse. Document Released: 01/12/2005 Document Revised: 04/06/2011 Document Reviewed: 10/29/2010 [facility] Patient Information 2015 [place], [place]. This information is not intended to replace advice given to you by your health care provider. Make sure you discuss any questions you have with your health care provider.

## 2014-09-23 NOTE — ED Provider Notes (Signed)
CSN: [id number]     Arrival date & time 09/23/14  1755 History   First MD Initiated Contact with Patient 09/23/14 1825     Chief Complaint  Patient presents with  . Abdominal Pain  . Emesis     (Consider location/radiation/quality/duration/timing/severity/associated sxs/prior Treatment) HPI Comments: Barry Parsons is a 39 y.o. male with a PMHx of diverticulosis, nephrolithiasis, and arthritis, with a PSHx of lap band 67yrs ago, who presents to the ED with complaints of sudden onset left lower quadrant abdominal pain which he states is consistent with his prior diverticulitis flares. He reports pain is 10/10 constant nonradiating pain which he describes as "being punched in the stomach", worse with movement, and unrelieved with his home Vicodin given that he has not been able to keep this down. Associated symptoms include nausea, 6 episodes of nonbloody nonbilious emesis, and 2 episodes of watery nonbloody diarrhea. Additionally he reports some chills. He has been on Cipro and Augmentin that he was started on 5 weeks ago for diverticulitis. He is followed by Dr. Elnoria Howard his gastroenterologist, and Dr. Selena Batten his primary care doctor.  He denies any fevers, chest pain, shortness breath, hematemesis, constipation, obstipation, melena, hematochezia, rectal pain, dysuria, hematuria, flank pain, numbness, tingling, weakness, recent travel, sick contacts, suspicious food intake, alcohol use, or NSAIDs use. No other prior abdominal surgeries aside from his lap band procedure.  Patient is a 39 y.o. male presenting with abdominal pain and vomiting. The history is provided by the patient and medical records. No language interpreter was used.  Abdominal Pain Pain location:  LLQ Pain quality comment:  "punching" Pain radiates to:  Does not radiate Pain severity:  Severe Onset quality:  Sudden Duration:  12 hours Timing:  Constant Progression:  Unchanged Chronicity:  Recurrent Context: recent illness     Context: not recent travel, not sick contacts and not suspicious food intake   Relieved by:  Nothing Worsened by:  Movement Ineffective treatments: vicodin, but unable to keep it down. Associated symptoms: chills, diarrhea, nausea and vomiting   Associated symptoms: no chest pain, no constipation, no dysuria, no fever, no flatus, no hematemesis, no hematochezia, no hematuria, no melena and no shortness of breath   Risk factors: no alcohol abuse and no NSAID use   Emesis Associated symptoms: abdominal pain, chills and diarrhea   Associated symptoms: no arthralgias and no myalgias     Past Medical History  Diagnosis Date  . Weight increase   . Sleep difficulties   . Arthritis   . Kidney stones    Past Surgical History  Procedure Laterality Date  . Vasectomy  2009  . Tonsillectomy  2011  . 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 Tonsillectomy     Family History  Problem Relation Age of Onset  . Heart disease Mother   . Kidney Stones    . Gallstones     Social History  Substance Use Topics  . Smoking status: Current Every Day Smoker -- 0.25 packs/day    Types: Cigarettes    Last Attempt to Quit: 01/27/2008  . Smokeless tobacco: Never Used  . Alcohol Use: Yes     Comment: occasional    Review of Systems  Constitutional: Positive for chills. Negative for fever.  Respiratory: Negative for shortness of breath.   Cardiovascular: Negative for chest pain.  Gastrointestinal: Positive for nausea, vomiting, abdominal pain and diarrhea. Negative for constipation, blood in stool, melena, hematochezia, anal bleeding, rectal pain, flatus and hematemesis.  Genitourinary: Negative for  dysuria, hematuria and flank pain.  Musculoskeletal: Negative for myalgias and arthralgias.  Skin: Negative for color change.  Allergic/Immunologic: Negative for immunocompromised state.  Neurological: Negative for weakness and numbness.  Psychiatric/Behavioral: Negative for confusion.   10 Systems  reviewed and are negative for acute change except as noted in the HPI.    Allergies  Review of patient's allergies indicates no known allergies.  Home Medications   Prior to Admission medications   Medication Sig Start Date End Date Taking? Authorizing Provider  ciprofloxacin (CIPRO) 500 MG tablet Take 1 tablet (500 mg total) by mouth 2 (two) times daily. Patient not taking: Reported on 06/14/2014 06/07/14   Maretta Bees, MD  ondansetron (ZOFRAN ODT) 4 MG disintegrating tablet  ODT q4 hours prn nausea/vomiting 06/07/14   Maretta Bees, MD  traMADol (ULTRAM) 50 MG tablet Take 50 mg by mouth every 8 (eight) hours as needed for moderate pain or severe pain (pain).  07/03/10   Historical Provider, MD  traZODone (DESYREL) 50 MG tablet Take 1 tablet by mouth at bedtime as needed for sleep (sleep).  04/30/14   Historical Provider, MD   BP 183/99 mmHg  Pulse 74  Temp(Src) 98.7 F (37.1 C) (Oral)  Resp 16  SpO2 98% Physical Exam  Constitutional: He is oriented to person, place, and time. Vital signs are normal. He appears well-developed and well-nourished.  Non-toxic appearance. He appears distressed (actively vomiting).  Afebrile, nontoxic, actively vomiting. Mild HTN noted, but likely from pt actively vomiting while VS were obtained  HENT:  Head: Normocephalic and atraumatic.  Mouth/Throat: Oropharynx is clear and moist. Mucous membranes are dry.  Mildly dry mucous membranes  Eyes: Conjunctivae and EOM are normal. Right eye exhibits no discharge. Left eye exhibits no discharge.  Neck: Normal range of motion. Neck supple.  Cardiovascular: Normal rate, regular rhythm, normal heart sounds and intact distal pulses.  Exam reveals no gallop and no friction rub.   No murmur heard. Pulmonary/Chest: Effort normal and breath sounds normal. No respiratory distress. He has no decreased breath sounds. He has no wheezes. He has no rhonchi. He has no rales.  Abdominal: Soft. Normal appearance and  bowel sounds are normal. He exhibits no distension. There is tenderness in the left lower quadrant. There is no rigidity, no rebound, no guarding, no CVA tenderness, no tenderness at McBurney's point and negative Murphy's sign.    Soft, nondistended, +BS throughout, with mild LLQ TTP, no r/g/r, neg murphy's, neg mcburney's, no CVA TTP   Musculoskeletal: Normal range of motion.  Neurological: He is alert and oriented to person, place, and time. He has normal strength. No sensory deficit.  Skin: Skin is warm, dry and intact. No rash noted.  Psychiatric: He has a normal mood and affect.  Nursing note and vitals reviewed.   ED Course  Procedures (including critical care time) Labs Review Labs Reviewed  LIPASE, BLOOD - Abnormal; Notable for the following:    Lipase 11 (*)    All other components within normal limits  COMPREHENSIVE METABOLIC PANEL - Abnormal; Notable for the following:    Glucose, Bld 152 (*)    All other components within normal limits  CBC - Abnormal; Notable for the following:    WBC 13.7 (*)    All other components within normal limits  DIFFERENTIAL - Abnormal; Notable for the following:    Neutrophils Relative % 94 (*)    Neutro Abs 12.8 (*)    Lymphocytes Relative 4 (*)  Lymphs Abs 0.5 (*)    Monocytes Relative 2 (*)    All other components within normal limits  URINALYSIS, ROUTINE W REFLEX MICROSCOPIC (NOT AT [facility])    Imaging Review Ct Abdomen Pelvis W Contrast  09/23/2014   CLINICAL DATA:  Nausea, vomiting and abdominal pain since this morning. Lap band surgery 3 years ago.  EXAM: CT ABDOMEN AND PELVIS WITH CONTRAST  TECHNIQUE: Multidetector CT imaging of the abdomen and pelvis was performed using the standard protocol following bolus administration of intravenous contrast.  CONTRAST:  OMNIPAQUE IOHEXOL 300 MG/ML  SOLN  COMPARISON:  09/13/2014 and 07/18/2014  FINDINGS: Lung bases are within normal.  Abdominal images demonstrate patient's lap band  apparatus with port within the subcutaneous fat of the right upper abdominal wall unchanged. Lap band appears in adequate position without evidence of slippage and is unchanged.  There is evidence of a possible tiny gallstone. The liver, spleen, pancreas and adrenal glands are normal. Appendix is normal.  Kidneys are normal size with a few small bilateral nonobstructing renal stones.  Vascular structures are normal.  There is minimal diverticulosis of the colon without active inflammation. Small bowel is within normal. Mesentery is within normal.  Pelvic images demonstrate the bladder, prostate and rectum to be normal. No free fluid. Remainder of the exam is unchanged.  IMPRESSION: No acute findings in the abdomen/ pelvis.  Lap band apparatus intact and unchanged without evidence of slippage.  Minimal cholelithiasis.  Mild nonobstructing bilateral nephrolithiasis.  Minimal diverticulosis of the colon without active inflammation.   Electronically Signed   By: Elberta Fortis M.D.   On: 09/23/2014 20:40   Dg Abd Acute W/chest  09/23/2014   CLINICAL DATA:  Abdominal pain, nausea and vomiting since this morning. Lap band surgery 3 years ago.  EXAM: DG ABDOMEN ACUTE W/ 1V CHEST  COMPARISON:  Chest x-ray 11/25/2010 and abdominal films 06/14/2014 as well as abdominal CT 09/13/2014  FINDINGS: Lungs are adequately inflated and otherwise clear. Cardiomediastinal silhouette and remainder of the chest is unchanged. No free subdiaphragmatic air.  Abdominal images demonstrate patient's lap band apparatus. The long axis of the O ring has a slightly widened angle of 68 degrees with the vertical does not completely in profile suggesting mild slippage. Supine image demonstrates a few air-filled slightly dilated small bowel loops in the right abdomen. Air and stool present throughout the colon. Multiple pelvic phleboliths.  IMPRESSION: Few air-filled minimally dilated small bowel loops in the right mid abdomen. Findings could be seen  due to ileus versus early/partial small bowel obstructive process.  Lap band apparatus demonstrating subtle changes which can be seen with mild O ring slippage.  No acute cardiopulmonary disease.   Electronically Signed   By: Elberta Fortis M.D.   On: 09/23/2014 19:19   I have personally reviewed and evaluated these images and lab results as part of my medical decision-making. Ct Abdomen Pelvis W Contrast  09/13/2014   CLINICAL DATA:  Low-grade fever. Diverticulitis recently. Abdominal pain 5 days. History of gastric banding  EXAM: CT ABDOMEN AND PELVIS WITH CONTRAST  TECHNIQUE: Multidetector CT imaging of the abdomen and pelvis was performed using the standard protocol following bolus administration of intravenous contrast.  CONTRAST:  ISOVUE-300 IOPAMIDOL (ISOVUE-300) INJECTION 61%  COMPARISON:  CT 07/18/2014  FINDINGS: Lower chest: Lung bases are clear.  Hepatobiliary: No focal hepatic lesion. No biliary duct dilatation. Gallbladder is normal. Common bile duct is normal.  Pancreas: Pancreas is normal. No ductal dilatation. No  pancreatic inflammation.  Spleen: Normal spleen  Adrenals/urinary tract: Adrenal glands and kidneys are normal. The ureters and bladder normal.  Stomach/Bowel: Gastric banding device at the GE junction. No evidence obstruction. Stomach, small bowel, appendix, cecum are normal. Mild diverticular disease of the sigmoid colon. No evidence of acute inflammation. Rectum appears normal  Vascular/Lymphatic: Abdominal aorta is normal caliber. There is no retroperitoneal or periportal lymphadenopathy. No pelvic lymphadenopathy.  Reproductive: Prostate normal  Musculoskeletal: No acute osseous abnormality.  Other: No free fluid.  IMPRESSION: 1. Mild sigmoid diverticulosis without diverticulitis. 2. Gastric banding device in place without evidence of complication. 3. Normal appendix   Electronically Signed   By: Genevive Bi M.D.   On: 09/13/2014 09:40      EKG Interpretation None       MDM   Final diagnoses:  Left lower quadrant pain  Nausea vomiting and diarrhea  Gastroenteritis  Diverticulosis of colon without diverticulitis  Hyperglycemia  Calculus of gallbladder with cholecystitis without biliary obstruction, unspecified cholecystitis acuity    39 y.o. male here with acute onset LLQ abd pain, n/v/d, which he states is from his diverticulitis flare. He had a CT on 09/13/14 which was neg for diverticulitis. On abx currently. Actively vomiting during exam. On exam, mild tenderness to LLQ, nonperitoneal. No CVA TTP. Will get labs, give fluids and ativan/zofran for nausea. Will reassess shortly.   7:53 PM Lipase WNL, CMP WNL aside form mildly elevated gluc at 152. CBC with mildly elevated WBC at 13.7 consistent with prior labs, could be from demargination vs dehydration. Acute abd series showing few air-filled dilated loops of small bowel concerning for ileus vs early/partial SBO. Will obtain CT to confirm whether this is going on. Currently nausea and pain improved after morphine, ativan, zofran, and some dilaudid. Will reassess shortly.   8:47 PM CT with no acute findings, no obstruction, minimal cholelithiasis without cholecystitis, mild nonobstructing b/l nephrolithiasis, and minimal diverticulosis without active inflammation. Will PO challenge.  10:15 PM U/A clear. Tolerating PO well. Will d/c home with zofran. Home pain meds to be used. F/up with PCP or GI in 3-5 days. I explained the diagnosis and have given explicit precautions to return to the ER including for any other new or worsening symptoms. The patient understands and accepts the medical plan as it's been dictated and I have answered their questions. Discharge instructions concerning home care and prescriptions have been given. The patient is STABLE and is discharged to home in good condition.  BP 154/75 mmHg  Pulse 84  Temp(Src) 98.7 F (37.1 C) (Oral)  Resp 16  SpO2 95%  Meds ordered this encounter    Medications  . sodium chloride 0.9 % bolus 1,000 mL    Sig:   . ondansetron (ZOFRAN) injection 4 mg    Sig:   . morphine 4 MG/ML injection 4 mg    Sig:   . LORazepam (ATIVAN) injection 1 mg    Sig:   . HYDROmorphone (DILAUDID) injection 1 mg    Sig:   . iohexol (OMNIPAQUE) 300 MG/ML solution 100 mL    Sig:   . sodium chloride 0.9 % bolus 1,000 mL    Sig:   . ondansetron (ZOFRAN) 8 MG tablet    Sig: Take 1 tablet (8 mg total) by mouth every 8 (eight) hours as needed for nausea or vomiting.    Dispense:  10 tablet    Refill:  0    Order Specific Question:  Supervising Provider  Answer:  Eber Hong [id number] Campr[place]-Soms, PA-C 09/23/14 2216  Cathren Laine, MD 09/23/14 2350

## 2014-09-23 NOTE — ED Notes (Signed)
Per pt, having "diverticulits flare". Pt having n/v with abdominal pain since 7am.  Pt actively vomiting on assessment.  Had lap band x 3 years ago.  States he does not need band adjusted when flare up occurs.

## 2014-10-10 ENCOUNTER — Other Ambulatory Visit: Payer: Self-pay | Admitting: General Surgery

## 2014-10-24 ENCOUNTER — Emergency Department (HOSPITAL_COMMUNITY)
Admission: EM | Admit: 2014-10-24 | Discharge: 2014-10-24 | Disposition: A | Discharge status: Home / Self Care | Payer: BLUE CROSS/BLUE SHIELD | Attending: Emergency Medicine | Admitting: Emergency Medicine

## 2014-10-24 ENCOUNTER — Encounter (HOSPITAL_COMMUNITY): Payer: Self-pay | Admitting: Emergency Medicine

## 2014-10-24 DIAGNOSIS — Z87442 Personal history of urinary calculi: Secondary | ICD-10-CM | POA: Diagnosis not present

## 2014-10-24 DIAGNOSIS — Z792 Long term (current) use of antibiotics: Secondary | ICD-10-CM | POA: Diagnosis not present

## 2014-10-24 DIAGNOSIS — Z79899 Other long term (current) drug therapy: Secondary | ICD-10-CM | POA: Diagnosis not present

## 2014-10-24 DIAGNOSIS — K5732 Diverticulitis of large intestine without perforation or abscess without bleeding: Secondary | ICD-10-CM | POA: Insufficient documentation

## 2014-10-24 DIAGNOSIS — M1990 Unspecified osteoarthritis, unspecified site: Secondary | ICD-10-CM | POA: Diagnosis not present

## 2014-10-24 DIAGNOSIS — R1032 Left lower quadrant pain: Secondary | ICD-10-CM | POA: Diagnosis present

## 2014-10-24 DIAGNOSIS — Z87891 Personal history of nicotine dependence: Secondary | ICD-10-CM | POA: Diagnosis not present

## 2014-10-24 LAB — COMPREHENSIVE METABOLIC PANEL
ALBUMIN: 4.9 g/dL (ref 3.5–5.0)
ALT: 21 U/L (ref 17–63)
AST: 40 U/L (ref 15–41)
Alkaline Phosphatase: 71 U/L (ref 38–126)
Anion gap: 11 (ref 5–15)
BUN: 11 mg/dL (ref 6–20)
CHLORIDE: 106 mmol/L (ref 101–111)
CO2: 21 mmol/L — AB (ref 22–32)
CREATININE: 0.9 mg/dL (ref 0.61–1.24)
Calcium: 9.6 mg/dL (ref 8.9–10.3)
GFR calc non Af Amer: >60 mL/min (ref >60)
GLUCOSE: 169 mg/dL — AB (ref 65–99)
Potassium: 4.8 mmol/L (ref 3.5–5.1)
SODIUM: 138 mmol/L (ref 135–145)
Total Bilirubin: 1.3 mg/dL — ABNORMAL HIGH (ref 0.3–1.2)
Total Protein: 8.7 g/dL — ABNORMAL HIGH (ref 6.5–8.1)

## 2014-10-24 LAB — DIFFERENTIAL
Basophils Absolute: 0 10*3/uL (ref 0.0–0.1)
Basophils Relative: 0 %
Eosinophils Absolute: 0 10*3/uL (ref 0.0–0.7)
Eosinophils Relative: 0 %
LYMPHS PCT: 5 %
Lymphs Abs: 0.6 10*3/uL — ABNORMAL LOW (ref 0.7–4.0)
MONO ABS: 0.2 10*3/uL (ref 0.1–1.0)
MONOS PCT: 2 %
NEUTROS ABS: 11 10*3/uL — AB (ref 1.7–7.7)
Neutrophils Relative %: 93 %

## 2014-10-24 LAB — URINALYSIS, ROUTINE W REFLEX MICROSCOPIC
Bilirubin Urine: NEGATIVE
GLUCOSE, UA: 100 mg/dL — AB
Hgb urine dipstick: NEGATIVE
KETONES UR: NEGATIVE mg/dL
LEUKOCYTES UA: NEGATIVE
Nitrite: NEGATIVE
PH: 6 (ref 5.0–8.0)
Protein, ur: NEGATIVE mg/dL
Specific Gravity, Urine: 1.019 (ref 1.005–1.030)
Urobilinogen, UA: 0.2 mg/dL (ref 0.0–1.0)

## 2014-10-24 LAB — CBC
HCT: 44.8 % (ref 39.0–52.0)
Hemoglobin: 16.2 g/dL (ref 13.0–17.0)
MCH: 32.4 pg (ref 26.0–34.0)
MCHC: 36.2 g/dL — AB (ref 30.0–36.0)
MCV: 89.6 fL (ref 78.0–100.0)
PLATELETS: 244 10*3/uL (ref 150–400)
RBC: 5 MIL/uL (ref 4.22–5.81)
RDW: 12.5 % (ref 11.5–15.5)
WBC: 12.1 10*3/uL — ABNORMAL HIGH (ref 4.0–10.5)

## 2014-10-24 LAB — LIPASE, BLOOD: LIPASE: 15 U/L — AB (ref 22–51)

## 2014-10-24 MED ORDER — METRONIDAZOLE 500 MG PO TABS: 500.0000 mg | ORAL_TABLET | Freq: Three times a day (TID) | ORAL | Status: DC

## 2014-10-24 MED ORDER — SODIUM CHLORIDE 0.9 % IV BOLUS (SEPSIS)
1000.0000 mL | Freq: Once | INTRAVENOUS | Status: AC
  Administered 2014-10-24: 1000 mL via INTRAVENOUS

## 2014-10-24 MED ORDER — CIPROFLOXACIN IN D5W 400 MG/200ML IV SOLN
400.0000 mg | Freq: Once | INTRAVENOUS | Status: AC
  Administered 2014-10-24: 400 mg via INTRAVENOUS
  Filled 2014-10-24: qty 200

## 2014-10-24 MED ORDER — ONDANSETRON HCL 4 MG/2ML IJ SOLN
4.0000 mg | Freq: Once | INTRAMUSCULAR | Status: AC | PRN
  Administered 2014-10-24: 4 mg via INTRAVENOUS
  Filled 2014-10-24 (×3): qty 2

## 2014-10-24 MED ORDER — CIPROFLOXACIN HCL 500 MG PO TABS: 500.0000 mg | ORAL_TABLET | Freq: Two times a day (BID) | ORAL | Status: DC

## 2014-10-24 MED ORDER — OXYCODONE-ACETAMINOPHEN 5-325 MG PO TABS: 1.0000 | ORAL_TABLET | ORAL | Status: DC | PRN

## 2014-10-24 MED ORDER — HYDROMORPHONE HCL 1 MG/ML IJ SOLN
0.5000 mg | Freq: Once | INTRAMUSCULAR | Status: AC
  Administered 2014-10-24: 0.5 mg via INTRAVENOUS
  Filled 2014-10-24: qty 1

## 2014-10-24 MED ORDER — PROMETHAZINE HCL 25 MG RE SUPP: 25.0000 mg | Freq: Four times a day (QID) | RECTAL | Status: DC | PRN

## 2014-10-24 MED ORDER — HYDROMORPHONE HCL 1 MG/ML IJ SOLN
1.0000 mg | Freq: Once | INTRAMUSCULAR | Status: AC
  Administered 2014-10-24: 1 mg via INTRAVENOUS
  Filled 2014-10-24: qty 1

## 2014-10-24 MED ORDER — METOCLOPRAMIDE HCL 5 MG/ML IJ SOLN
10.0000 mg | Freq: Once | INTRAMUSCULAR | Status: AC
  Administered 2014-10-24: 10 mg via INTRAVENOUS
  Filled 2014-10-24: qty 2

## 2014-10-24 MED ORDER — METRONIDAZOLE IN NACL 5-0.79 MG/ML-% IV SOLN
500.0000 mg | Freq: Once | INTRAVENOUS | Status: AC
  Administered 2014-10-24: 500 mg via INTRAVENOUS
  Filled 2014-10-24: qty 100

## 2014-10-24 NOTE — Discharge Instructions (Signed)
Diverticulitis °Diverticulitis is when small pockets that have formed in your colon (large intestine) become infected or swollen. °HOME CARE °· Follow your doctor's instructions. °· Follow a special diet if told by your doctor. °· When you feel better, your doctor may tell you to change your diet. You may be told to eat a lot of fiber. Fruits and vegetables are good sources of fiber. Fiber makes it easier to poop (have bowel movements). °· Take supplements or probiotics as told by your doctor. °· Only take medicines as told by your doctor. °· Keep all follow-up visits with your doctor. °GET HELP IF: °· Your pain does not get better. °· You have a hard time eating food. °· You are not pooping like normal. °GET HELP RIGHT AWAY IF: °· Your pain gets worse. °· Your problems do not get better. °· Your problems suddenly get worse. °· You have a fever. °· You keep throwing up (vomiting). °· You have bloody or black, tarry poop (stool). °MAKE SURE YOU:  °· Understand these instructions. °· Will watch your condition. °· Will get help right away if you are not doing well or get worse. °Document Released: 07/01/2007 Document Revised: 01/17/2013 Document Reviewed: 12/07/2012 °[employer]® Patient Information ©2015 [employer]. This information is not intended to replace advice given to you by your health care provider. Make sure you discuss any questions you have with your health care provider. ° °

## 2014-10-24 NOTE — ED Provider Notes (Signed)
CSN: [id number]     Arrival date & time 10/24/14  0357 History   First MD Initiated Contact with Patient 10/24/14 [phone]     Chief Complaint  Patient presents with  . Diverticulitis    hx of same     (Consider location/radiation/quality/duration/timing/severity/associated sxs/prior Treatment) Patient is a 39 y.o. male presenting with abdominal pain. The history is provided by the patient. No language interpreter was used.  Abdominal Pain Pain location:  LLQ Pain quality: cramping   Pain radiates to:  Does not radiate Pain severity:  Moderate Onset quality:  Gradual Duration:  1 week Timing:  Constant Progression:  Worsening Associated symptoms: nausea and vomiting   Associated symptoms: no chest pain, no chills, no diarrhea, no fever and no shortness of breath   Associated symptoms comment:  Patient with a history of diverticulitis pending partial colectomy for same scheduled for October. He is followed by Dr. Elnoria Howard. He has symptoms of hot/cold chills, nausea/vomiting. No diarrhea or bloody stools. He feels recurrent infection might have started last week but he was able to "stave it off" until tonight when his symptoms became severe.    Past Medical History  Diagnosis Date  . Weight increase   . Sleep difficulties   . Arthritis   . Kidney stones    Past Surgical History  Procedure Laterality Date  . Vasectomy  2009  . Tonsillectomy  2011  . 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 Tonsillectomy     Family History  Problem Relation Age of Onset  . Heart disease Mother   . Kidney Stones    . Gallstones     Social History  Substance Use Topics  . Smoking status: Former Smoker -- 0.25 packs/day    Types: Cigarettes    Quit date: 01/27/2008  . Smokeless tobacco: Never Used  . Alcohol Use: Yes     Comment: occasional    Review of Systems  Constitutional: Negative for fever and chills.  Respiratory: Negative.  Negative for shortness of breath.   Cardiovascular:  Negative.  Negative for chest pain.  Gastrointestinal: Positive for nausea, vomiting and abdominal pain. Negative for diarrhea.  Musculoskeletal: Negative.  Negative for myalgias.  Skin: Negative.   Neurological: Negative.       Allergies  Review of patient's allergies indicates no known allergies.  Home Medications   Prior to Admission medications   Medication Sig Start Date End Date Taking? Authorizing Provider  amoxicillin-clavulanate (AUGMENTIN) 875-125 MG per tablet Take 1 tablet by mouth 2 (two) times daily.  08/27/14  Yes Historical Provider, MD  HYDROcodone-acetaminophen (NORCO/VICODIN) 5-325 MG per tablet Take 1 tablet by mouth every 6 (six) hours as needed for moderate pain or severe pain.  09/12/14  Yes Historical Provider, MD  ondansetron (ZOFRAN ODT) 4 MG disintegrating tablet  ODT q4 hours prn nausea/vomiting 06/07/14  Yes Shanker Levora Dredge, MD  promethazine (PHENERGAN) 25 MG suppository Place 25 mg rectally every 6 (six) hours as needed for nausea or vomiting.  09/12/14  Yes Historical Provider, MD  traMADol (ULTRAM) 50 MG tablet Take 50 mg by mouth every 8 (eight) hours as needed for moderate pain or severe pain (pain).  07/03/10  Yes Historical Provider, MD  ciprofloxacin (CIPRO) 500 MG tablet Take 1 tablet (500 mg total) by mouth 2 (two) times daily. Patient not taking: Reported on 06/14/2014 06/07/14   Maretta Bees, MD  ondansetron (ZOFRAN) 8 MG tablet Take 1 tablet (8 mg total) by mouth every 8 (  eight) hours as needed for nausea or vomiting. Patient not taking: Reported on 10/22/2014 09/23/14   Mercedes Camprubi-Soms, PA-C   BP 171/94 mmHg  Pulse 73  Temp(Src) 99 F (37.2 C) (Oral)  Resp 18  SpO2 100% Physical Exam  Constitutional: He is oriented to person, place, and time. He appears well-developed and well-nourished.  HENT:  Head: Normocephalic.  Mouth/Throat: Mucous membranes are dry.  Neck: Normal range of motion. Neck supple.  Cardiovascular: Normal rate  and regular rhythm.   Pulmonary/Chest: Effort normal and breath sounds normal.  Abdominal: Soft. Bowel sounds are normal. He exhibits no distension. There is tenderness. There is no rebound and no guarding.  LLQ abdominal tenderness to soft abdomen. BS hypoactive.   Musculoskeletal: Normal range of motion.  Neurological: He is alert and oriented to person, place, and time.  Skin: Skin is warm and dry. No rash noted.  Psychiatric: He has a normal mood and affect.    ED Course  Procedures (including critical care time) Labs Review Labs Reviewed  LIPASE, BLOOD - Abnormal; Notable for the following:    Lipase 15 (*)    All other components within normal limits  COMPREHENSIVE METABOLIC PANEL - Abnormal; Notable for the following:    CO2 21 (*)    Glucose, Bld 169 (*)    Total Protein 8.7 (*)    Total Bilirubin 1.3 (*)    All other components within normal limits  CBC - Abnormal; Notable for the following:    WBC 12.1 (*)    MCHC 36.2 (*)    All other components within normal limits  URINALYSIS, ROUTINE W REFLEX MICROSCOPIC (NOT AT [facility])  DIFFERENTIAL   Results for orders placed or performed during the hospital encounter of 10/24/14  Lipase, blood  Result Value Ref Range   Lipase 15 (L) 22 - 51 U/L  Comprehensive metabolic panel  Result Value Ref Range   Sodium 138 135 - 145 mmol/L   Potassium 4.8 3.5 - 5.1 mmol/L   Chloride 106 101 - 111 mmol/L   CO2 21 (L) 22 - 32 mmol/L   Glucose, Bld 169 (H) 65 - 99 mg/dL   BUN 11 6 - 20 mg/dL   Creatinine, Ser 3.08 0.61 - 1.24 mg/dL   Calcium 9.6 8.9 - 65.7 mg/dL   Total Protein 8.7 (H) 6.5 - 8.1 g/dL   Albumin 4.9 3.5 - 5.0 g/dL   AST 40 15 - 41 U/L   ALT 21 17 - 63 U/L   Alkaline Phosphatase 71 38 - 126 U/L   Total Bilirubin 1.3 (H) 0.3 - 1.2 mg/dL   GFR calc non Af Amer >60 >60 mL/min   GFR calc Af Amer >60 >60 mL/min   Anion gap 11 5 - 15  CBC  Result Value Ref Range   WBC 12.1 (H) 4.0 - 10.5 K/uL   RBC 5.00 4.22 - 5.81  MIL/uL   Hemoglobin 16.2 13.0 - 17.0 g/dL   HCT 84.6 96.2 - 95.2 %   MCV 89.6 78.0 - 100.0 fL   MCH 32.4 26.0 - 34.0 pg   MCHC 36.2 (H) 30.0 - 36.0 g/dL   RDW 84.1 32.4 - 40.1 %   Platelets 244 150 - 400 K/uL  Differential  Result Value Ref Range   Neutrophils Relative % 93 %   Neutro Abs 11.0 (H) 1.7 - 7.7 K/uL   Lymphocytes Relative 5 %   Lymphs Abs 0.6 (L) 0.7 - 4.0 K/uL   Monocytes  Relative 2 %   Monocytes Absolute 0.2 0.1 - 1.0 K/uL   Eosinophils Relative 0 %   Eosinophils Absolute 0.0 0.0 - 0.7 K/uL   Basophils Relative 0 %   Basophils Absolute 0.0 0.0 - 0.1 K/uL    Imaging Review No results found. I have personally reviewed and evaluated these images and lab results as part of my medical decision-making.   EKG Interpretation None      MDM   Final diagnoses:  None    1. Diverticulitis  The patient is nontoxic in appearance. VSS, no tachycardia, hypotension. He has GI follow up.  Chart reviewed. He has had multiple scans in the last 4 months - no repeat study tonight given familiar symptoms and stable presentation. Cipro and Flagyl started. Pain and nausea controlled. He is tolerating PO fluids.   Plan: discharge home on Cipro and Flagyl. He will need to call Dr. Elnoria Howard today for follow up per his instructions.     Elpidio Anis, PA-C 10/24/14 1610  Loren Racer, MD 10/24/14 [phone]

## 2014-10-24 NOTE — ED Notes (Signed)
Patient c/o abd pain and vomiting. Hx of diverticulitis, believes this is the same. Scheduled for surgery on 11/05/14. Sees Dr Pam Drown.

## 2014-10-25 NOTE — Patient Instructions (Addendum)
YOUR PROCEDURE IS SCHEDULED ON :  11/04/13  REPORT TO [facility] MAIN ENTRANCE FOLLOW SIGNS TO EAST ELEVATOR - GO TO 3rd FLOOR CHECK IN AT 3 EAST NURSES STATION (SHORT STAY) AT:  10:30 AM  CALL THIS NUMBER IF YOU HAVE PROBLEMS THE MORNING OF SURGERY [phone]  REMEMBER:ONLY 1 PER PERSON MAY GO TO SHORT STAY WITH YOU TO GET READY THE MORNING OF YOUR SURGERY  DO NOT EAT FOOD  AFTER MIDNIGHT  TAKE THESE MEDICINES THE MORNING OF SURGERY:  HYDROCODONE / ZOFRAN OR PHENERGAN IF NEEDED  STOP ASPIRIN / IBUPROFEN / ALEVE / VITAMINS / HERBAL MEDS __7__ DAYS BEFORE SURGERY  FOLLOW BOWEL PREP INSTRUCTIONS  CLEAR LIQUID DIET - UNTIL 6:30 AM  Foods Allowed                                                                     Foods Excluded  Coffee and tea, regular and decaf                             liquids that you cannot  Plain Jell-O in any flavor                                             see through such as: Fruit ices (not with fruit pulp)                                     milk, soups, orange juice  Iced Popsicles                                                 All solid food Carbonated beverages, regular and diet                                    Cranberry, grape and apple juices Sports drinks like Gatorade Lightly seasoned clear broth or consume(fat free) Sugar, honey syrup   _____________________________________________________________________    YOU MAY NOT HAVE ANY METAL ON YOUR BODY INCLUDING HAIR PINS AND PIERCING'S. DO NOT WEAR JEWELRY, MAKEUP, LOTIONS, POWDERS OR PERFUMES. DO NOT WEAR NAIL POLISH. DO NOT SHAVE 48 HRS PRIOR TO SURGERY. MEN MAY SHAVE FACE AND NECK.  DO NOT BRING VALUABLES TO HOSPITAL.  IS NOT RESPONSIBLE FOR VALUABLES.  CONTACTS, DENTURES OR PARTIALS MAY NOT BE WORN TO SURGERY. LEAVE SUITCASE IN CAR. CAN BE BROUGHT TO ROOM AFTER SURGERY.  PATIENTS DISCHARGED THE DAY OF SURGERY WILL NOT BE ALLOWED TO DRIVE  HOME.  PLEASE READ OVER THE FOLLOWING INSTRUCTION SHEETS _________________________________________________________________________________  [facility] - PREPARING FOR SURGERY  Before surgery, you can play an important role.  Because skin is not sterile, your skin needs to be as free of germs as possible.  You can reduce the number of germs on your skin by washing with CHG (chlorahexidine gluconate) soap before surgery.  CHG is an antiseptic cleaner which kills germs and bonds with the skin to continue killing germs even after washing. Please DO NOT use if you have an allergy to CHG or antibacterial soaps.  If your skin becomes reddened/irritated stop using the CHG and inform your nurse when you arrive at Short Stay. Do not shave (including legs and underarms) for at least 48 hours prior to the first CHG shower.  You may shave your face. Please follow these instructions carefully:   1.  Shower with CHG Soap the night before surgery and the  morning of Surgery.   2.  If you choose to wash your hair, wash your hair first as usual with your  normal  Shampoo.   3.  After you shampoo, rinse your hair and body thoroughly to remove the  shampoo.                                         4.  Use CHG as you would any other liquid soap.  You can apply chg directly  to the skin and wash . Gently wash with scrungie or clean wascloth    5.  Apply the CHG Soap to your body ONLY FROM THE NECK DOWN.   Do not use on open                           Wound or open sores. Avoid contact with eyes, ears mouth and genitals (private parts).                        Genitals (private parts) with your normal soap.              6.  Wash thoroughly, paying special attention to the area where your surgery  will be performed.   7.  Thoroughly rinse your body with warm water from the neck down.   8.  DO NOT shower/wash with your normal soap after using and rinsing off  the CHG  Soap .                9.  Pat yourself dry with a clean towel.             10.  Wear clean night clothes to bed after shower             11.  Place clean sheets on your bed the night of your first shower and do not  sleep with pets.  Day of Surgery : Do not apply any lotions/deodorants the morning of surgery.  Please wear clean clothes to the hospital/surgery center.  FAILURE TO FOLLOW THESE INSTRUCTIONS MAY RESULT IN THE CANCELLATION OF YOUR SURGERY    PATIENT SIGNATURE_________________________________  ______________________________________________________________________

## 2014-10-26 ENCOUNTER — Emergency Department (HOSPITAL_COMMUNITY)
Admission: EM | Admit: 2014-10-26 | Discharge: 2014-10-26 | Disposition: A | Discharge status: Home / Self Care | Payer: BLUE CROSS/BLUE SHIELD | Attending: Emergency Medicine | Admitting: Emergency Medicine

## 2014-10-26 ENCOUNTER — Encounter (HOSPITAL_COMMUNITY): Payer: Self-pay | Admitting: Emergency Medicine

## 2014-10-26 DIAGNOSIS — R112 Nausea with vomiting, unspecified: Secondary | ICD-10-CM | POA: Insufficient documentation

## 2014-10-26 DIAGNOSIS — Z792 Long term (current) use of antibiotics: Secondary | ICD-10-CM | POA: Diagnosis not present

## 2014-10-26 DIAGNOSIS — Z87891 Personal history of nicotine dependence: Secondary | ICD-10-CM | POA: Insufficient documentation

## 2014-10-26 DIAGNOSIS — R197 Diarrhea, unspecified: Secondary | ICD-10-CM | POA: Insufficient documentation

## 2014-10-26 DIAGNOSIS — R1084 Generalized abdominal pain: Secondary | ICD-10-CM | POA: Insufficient documentation

## 2014-10-26 DIAGNOSIS — M1990 Unspecified osteoarthritis, unspecified site: Secondary | ICD-10-CM | POA: Diagnosis not present

## 2014-10-26 DIAGNOSIS — R109 Unspecified abdominal pain: Secondary | ICD-10-CM | POA: Diagnosis present

## 2014-10-26 DIAGNOSIS — Z87442 Personal history of urinary calculi: Secondary | ICD-10-CM | POA: Insufficient documentation

## 2014-10-26 LAB — URINALYSIS, ROUTINE W REFLEX MICROSCOPIC
Bilirubin Urine: NEGATIVE
GLUCOSE, UA: NEGATIVE mg/dL
HGB URINE DIPSTICK: NEGATIVE
Ketones, ur: NEGATIVE mg/dL
Leukocytes, UA: NEGATIVE
Nitrite: NEGATIVE
PH: 7.5 (ref 5.0–8.0)
Protein, ur: NEGATIVE mg/dL
SPECIFIC GRAVITY, URINE: 1.011 (ref 1.005–1.030)
Urobilinogen, UA: 0.2 mg/dL (ref 0.0–1.0)

## 2014-10-26 LAB — COMPREHENSIVE METABOLIC PANEL
ALBUMIN: 4.8 g/dL (ref 3.5–5.0)
ALK PHOS: 66 U/L (ref 38–126)
ALT: 24 U/L (ref 17–63)
ANION GAP: 8 (ref 5–15)
AST: 31 U/L (ref 15–41)
BUN: 9 mg/dL (ref 6–20)
CALCIUM: 9.4 mg/dL (ref 8.9–10.3)
CO2: 22 mmol/L (ref 22–32)
Chloride: 108 mmol/L (ref 101–111)
Creatinine, Ser: 0.79 mg/dL (ref 0.61–1.24)
GFR calc Af Amer: >60 mL/min (ref >60)
GFR calc non Af Amer: >60 mL/min (ref >60)
GLUCOSE: 152 mg/dL — AB (ref 65–99)
Potassium: 3.6 mmol/L (ref 3.5–5.1)
SODIUM: 138 mmol/L (ref 135–145)
Total Bilirubin: 1 mg/dL (ref 0.3–1.2)
Total Protein: 7.9 g/dL (ref 6.5–8.1)

## 2014-10-26 LAB — CBC
HEMATOCRIT: 46.6 % (ref 39.0–52.0)
HEMOGLOBIN: 17 g/dL (ref 13.0–17.0)
MCH: 32.4 pg (ref 26.0–34.0)
MCHC: 36.5 g/dL — AB (ref 30.0–36.0)
MCV: 88.9 fL (ref 78.0–100.0)
Platelets: 267 10*3/uL (ref 150–400)
RBC: 5.24 MIL/uL (ref 4.22–5.81)
RDW: 12.1 % (ref 11.5–15.5)
WBC: 12.7 10*3/uL — ABNORMAL HIGH (ref 4.0–10.5)

## 2014-10-26 LAB — LIPASE, BLOOD: Lipase: 16 U/L — ABNORMAL LOW (ref 22–51)

## 2014-10-26 MED ORDER — AMOXICILLIN-POT CLAVULANATE 875-125 MG PO TABS: 1.0000 | ORAL_TABLET | Freq: Two times a day (BID) | ORAL | Status: DC

## 2014-10-26 MED ORDER — CIPROFLOXACIN HCL 500 MG PO TABS: 500.0000 mg | ORAL_TABLET | Freq: Two times a day (BID) | ORAL | Status: DC

## 2014-10-26 MED ORDER — ONDANSETRON HCL 4 MG PO TABS: 8.0000 mg | ORAL_TABLET | Freq: Three times a day (TID) | ORAL | Status: DC | PRN

## 2014-10-26 MED ORDER — HYDROMORPHONE HCL 1 MG/ML IJ SOLN
1.0000 mg | Freq: Once | INTRAMUSCULAR | Status: AC
  Administered 2014-10-26: 1 mg via INTRAVENOUS
  Filled 2014-10-26: qty 1

## 2014-10-26 MED ORDER — ONDANSETRON 8 MG PO TBDP: 8.0000 mg | ORAL_TABLET | Freq: Three times a day (TID) | ORAL | Status: DC | PRN

## 2014-10-26 MED ORDER — METRONIDAZOLE 500 MG PO TABS: 500.0000 mg | ORAL_TABLET | Freq: Three times a day (TID) | ORAL | Status: DC

## 2014-10-26 MED ORDER — OXYCODONE-ACETAMINOPHEN 5-325 MG PO TABS: 1.0000 | ORAL_TABLET | ORAL | Status: DC | PRN

## 2014-10-26 MED ORDER — FENTANYL CITRATE (PF) 100 MCG/2ML IJ SOLN
50.0000 ug | Freq: Once | INTRAMUSCULAR | Status: AC
  Administered 2014-10-26: 50 ug via INTRAVENOUS
  Filled 2014-10-26: qty 2

## 2014-10-26 MED ORDER — PROMETHAZINE HCL 25 MG/ML IJ SOLN
12.5000 mg | Freq: Once | INTRAMUSCULAR | Status: AC
  Administered 2014-10-26: 12.5 mg via INTRAVENOUS
  Filled 2014-10-26: qty 1

## 2014-10-26 MED ORDER — SODIUM CHLORIDE 0.9 % IV BOLUS (SEPSIS)
1000.0000 mL | Freq: Once | INTRAVENOUS | Status: AC
  Administered 2014-10-26: 1000 mL via INTRAVENOUS

## 2014-10-26 MED ORDER — ONDANSETRON HCL 4 MG/2ML IJ SOLN
4.0000 mg | Freq: Once | INTRAMUSCULAR | Status: AC
  Administered 2014-10-26: 4 mg via INTRAVENOUS
  Filled 2014-10-26: qty 2

## 2014-10-26 NOTE — ED Provider Notes (Signed)
CSN: [id number]     Arrival date & time 10/26/14  1807 History   First MD Initiated Contact with Patient 10/26/14 1843     Chief Complaint  Patient presents with  . Emesis  . Diarrhea  . Abdominal Pain     (Consider location/radiation/quality/duration/timing/severity/associated sxs/prior Treatment) HPI Barry Parsons is a 39 y.o. male with hx of lab band, kidney stones and diverticulitis, presents to ED with complaint of abdominal pain, nausea, vomiting. Pt states pain started today after having a bowel movement this afternoon. Multiple episodes of vomiting since then. Unable to keep anything down. Pt states multiple visits to ED for the same. Pt scheduled for bowel resection in 10 days by Dr. Johna Sheriff. Pt states when his pain is so severe he is told to go to ER. Pt tried taking PO and PR phenergan but states was unable to keep either down/up. Denies very or chills. No other complaints.   Past Medical History  Diagnosis Date  . Weight increase   . Sleep difficulties   . Arthritis   . Kidney stones    Past Surgical History  Procedure Laterality Date  . Vasectomy  2009  . Tonsillectomy  2011  . 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 Tonsillectomy     Family History  Problem Relation Age of Onset  . Heart disease Mother   . Kidney Stones    . Gallstones     Social History  Substance Use Topics  . Smoking status: Former Smoker -- 0.25 packs/day    Types: Cigarettes    Quit date: 01/27/2008  . Smokeless tobacco: Never Used  . Alcohol Use: Yes     Comment: occasional    Review of Systems  Constitutional: Negative for fever and chills.  Respiratory: Negative for cough, chest tightness and shortness of breath.   Cardiovascular: Negative for chest pain, palpitations and leg swelling.  Gastrointestinal: Positive for nausea, vomiting and abdominal pain. Negative for diarrhea and abdominal distention.  Genitourinary: Negative for dysuria, urgency, frequency and hematuria.   Musculoskeletal: Negative for myalgias, arthralgias, neck pain and neck stiffness.  Skin: Negative for rash.  Allergic/Immunologic: Negative for immunocompromised state.  Neurological: Negative for dizziness, weakness, light-headedness, numbness and headaches.  All other systems reviewed and are negative.     Allergies  Review of patient's allergies indicates no known allergies.  Home Medications   Prior to Admission medications   Medication Sig Start Date End Date Taking? Authorizing Provider  amoxicillin-clavulanate (AUGMENTIN) 875-125 MG per tablet Take 1 tablet by mouth 2 (two) times daily.  08/27/14  Yes Historical Provider, MD  ciprofloxacin (CIPRO) 500 MG tablet Take 1 tablet (500 mg total) by mouth 2 (two) times daily. Patient taking differently: Take 500 mg by mouth 2 (two) times daily. For 10 days 10-24-14 to 11-03-14 10/24/14  Yes Shari Upstill, PA-C  HYDROcodone-acetaminophen (NORCO/VICODIN) 5-325 MG per tablet Take 1 tablet by mouth every 6 (six) hours as needed for moderate pain or severe pain.  09/12/14  Yes Historical Provider, MD  ondansetron (ZOFRAN) 8 MG tablet Take 1 tablet (8 mg total) by mouth every 8 (eight) hours as needed for nausea or vomiting. 09/23/14  Yes Mercedes Camprubi-Soms, PA-C  oxyCODONE-acetaminophen (PERCOCET/ROXICET) 5-325 MG tablet Take 1-2 tablets by mouth every 4 (four) hours as needed for severe pain. 10/24/14  Yes Elpidio Anis, PA-C  promethazine (PHENERGAN) 25 MG suppository Place 1 suppository (25 mg total) rectally every 6 (six) hours as needed for nausea or vomiting. 10/24/14  Yes Elpidio Anis, PA-C  traMADol (ULTRAM) 50 MG tablet Take 50 mg by mouth every 8 (eight) hours as needed for moderate pain or severe pain (pain).  07/03/10  Yes Historical Provider, MD  metroNIDAZOLE (FLAGYL) 500 MG tablet Take 1 tablet (500 mg total) by mouth 3 (three) times daily. Patient not taking: Reported on 10/26/2014 10/24/14   Elpidio Anis, PA-C  ondansetron (ZOFRAN  ODT) 4 MG disintegrating tablet  ODT q4 hours prn nausea/vomiting Patient not taking: Reported on 10/26/2014 06/07/14   Maretta Bees, MD   BP 181/93 mmHg  Pulse 67  Temp(Src) 99.1 F (37.3 C) (Oral)  Resp 16  SpO2 100% Physical Exam  Constitutional: He appears well-developed and well-nourished. No distress.  HENT:  Head: Normocephalic and atraumatic.  Eyes: Conjunctivae are normal.  Neck: Neck supple.  Cardiovascular: Normal rate, regular rhythm and normal heart sounds.   Pulmonary/Chest: Effort normal. No respiratory distress. He has no wheezes. He has no rales.  Abdominal: Soft. Bowel sounds are normal. He exhibits no distension. There is tenderness. There is no rebound.  Diffuse tenderness, worse in LLQ.   Musculoskeletal: He exhibits no edema.  Neurological: He is alert.  Skin: Skin is warm and dry.  Nursing note and vitals reviewed.   ED Course  Procedures (including critical care time) Labs Review Labs Reviewed  CBC - Abnormal; Notable for the following:    WBC 12.7 (*)    MCHC 36.5 (*)    All other components within normal limits  LIPASE, BLOOD  COMPREHENSIVE METABOLIC PANEL  URINALYSIS, ROUTINE W REFLEX MICROSCOPIC (NOT AT [facility])    Imaging Review No results found. I have personally reviewed and evaluated these images and lab results as part of my medical decision-making.   EKG Interpretation None      MDM   Final diagnoses:  Generalized abdominal pain   Pt with hx of diverticulitis, here with increased pain after having a bowel movement this afternoon. States pain feels the same as prior diverticulitis. Pt was seen two days ago for the same, discharged home. No imaging since 09/23/14, at that time no active diverticulitis. Will start fluids, pain meds ordred.    10:48 PM Patient is feeling much better. No more vomiting episodes. He is tolerating water. He wants to go home. Requesting Zofran refill. States he has Percocet and Phenergan left. I do  not think pt needs any further imaging at this time. VS normal. Abdomen is soft.   Will discharge home with follow-up with Dr. Elnoria Howard. Return precautions discussed  Filed Vitals:   10/26/14 1817 10/26/14 2000 10/26/14 2100 10/26/14 2105  BP: 181/93 130/75 116/58 116/58  Pulse: 67 62 69 71  Temp: 99.1 F (37.3 C)     TempSrc: Oral     Resp: 16   18  SpO2: 100% 98% 99% 99%     Jaynie Crumble, PA-C 10/26/14 2255  Azalia Bilis, MD 10/26/14 [phone]

## 2014-10-26 NOTE — Discharge Instructions (Signed)
Continue your medications. Drink plenty of fluids. Follow up with your gastroenterologist. Return if worsening.   Diverticulitis Diverticulitis is inflammation or infection of small pouches in your colon that form when you have a condition called diverticulosis. The pouches in your colon are called diverticula. Your colon, or large intestine, is where water is absorbed and stool is formed. Complications of diverticulitis can include:  Bleeding.  Severe infection.  Severe pain.  Perforation of your colon.  Obstruction of your colon. CAUSES  Diverticulitis is caused by bacteria. Diverticulitis happens when stool becomes trapped in diverticula. This allows bacteria to grow in the diverticula, which can lead to inflammation and infection. RISK FACTORS People with diverticulosis are at risk for diverticulitis. Eating a diet that does not include enough fiber from fruits and vegetables may make diverticulitis more likely to develop. SYMPTOMS  Symptoms of diverticulitis may include:  Abdominal pain and tenderness. The pain is normally located on the left side of the abdomen, but may occur in other areas.  Fever and chills.  Bloating.  Cramping.  Nausea.  Vomiting.  Constipation.  Diarrhea.  Blood in your stool. DIAGNOSIS  Your health care provider will ask you about your medical history and do a physical exam. You may need to have tests done because many medical conditions can cause the same symptoms as diverticulitis. Tests may include:  Blood tests.  Urine tests.  Imaging tests of the abdomen, including X-rays and CT scans. When your condition is under control, your health care provider may recommend that you have a colonoscopy. A colonoscopy can show how severe your diverticula are and whether something else is causing your symptoms. TREATMENT  Most cases of diverticulitis are mild and can be treated at home. Treatment may include:  Taking over-the-counter pain  medicines.  Following a clear liquid diet.  Taking antibiotic medicines by mouth for 7-10 days. More severe cases may be treated at a hospital. Treatment may include:  Not eating or drinking.  Taking prescription pain medicine.  Receiving antibiotic medicines through an IV tube.  Receiving fluids and nutrition through an IV tube.  Surgery. HOME CARE INSTRUCTIONS   Follow your health care provider's instructions carefully.  Follow a full liquid diet or other diet as directed by your health care provider. After your symptoms improve, your health care provider may tell you to change your diet. He or she may recommend you eat a high-fiber diet. Fruits and vegetables are good sources of fiber. Fiber makes it easier to pass stool.  Take fiber supplements or probiotics as directed by your health care provider.  Only take medicines as directed by your health care provider.  Keep all your follow-up appointments. SEEK MEDICAL CARE IF:   Your pain does not improve.  You have a hard time eating food.  Your bowel movements do not return to normal. SEEK IMMEDIATE MEDICAL CARE IF:   Your pain becomes worse.  Your symptoms do not get better.  Your symptoms suddenly get worse.  You have a fever.  You have repeated vomiting.  You have bloody or black, tarry stools. MAKE SURE YOU:   Understand these instructions.  Will watch your condition.  Will get help right away if you are not doing well or get worse. Document Released: 10/22/2004 Document Revised: 01/17/2013 Document Reviewed: 12/07/2012 [facility] Patient Information 2015 [place], [place]. This information is not intended to replace advice given to you by your health care provider. Make sure you discuss any questions you have with your  health care provider. ° ° °

## 2014-10-26 NOTE — ED Notes (Signed)
Pt states that he having another diverticulitis flare up that started around 1130am today. Pt has abd pain, n/v/d. Pt states that he was seen here 2 days ago for the same.

## 2014-10-26 NOTE — ED Notes (Signed)
Pt is aware of need for urine specimen. 

## 2014-10-26 NOTE — ED Notes (Signed)
Pt is tolerating water w/o difficulty.  Denies nausea/emesis.  PA is aware.

## 2014-10-26 NOTE — ED Notes (Signed)
O2 noted to be 88% on RA.  2L O2 via [place] applied w/ an improvement in O2 sat to 98%.

## 2014-10-29 ENCOUNTER — Encounter (HOSPITAL_COMMUNITY): Payer: Self-pay

## 2014-10-29 ENCOUNTER — Encounter (HOSPITAL_COMMUNITY)
Admission: RE | Admit: 2014-10-29 | Discharge: 2014-10-29 | Disposition: A | Discharge status: Home / Self Care | Payer: BLUE CROSS/BLUE SHIELD | Source: Ambulatory Visit | Attending: General Surgery | Admitting: General Surgery

## 2014-10-29 DIAGNOSIS — Z01818 Encounter for other preprocedural examination: Secondary | ICD-10-CM | POA: Insufficient documentation

## 2014-10-29 DIAGNOSIS — K5792 Diverticulitis of intestine, part unspecified, without perforation or abscess without bleeding: Secondary | ICD-10-CM | POA: Insufficient documentation

## 2014-10-29 HISTORY — DX: Diverticulitis of intestine, part unspecified, without perforation or abscess without bleeding: K57.92

## 2014-10-29 LAB — BASIC METABOLIC PANEL
ANION GAP: 6 (ref 5–15)
BUN: 7 mg/dL (ref 6–20)
CHLORIDE: 105 mmol/L (ref 101–111)
CO2: 27 mmol/L (ref 22–32)
Calcium: 9 mg/dL (ref 8.9–10.3)
Creatinine, Ser: 0.79 mg/dL (ref 0.61–1.24)
GFR calc Af Amer: >60 mL/min (ref >60)
GLUCOSE: 98 mg/dL (ref 65–99)
POTASSIUM: 4 mmol/L (ref 3.5–5.1)
Sodium: 138 mmol/L (ref 135–145)

## 2014-10-29 LAB — CBC
HEMATOCRIT: 39.7 % (ref 39.0–52.0)
HEMOGLOBIN: 13.9 g/dL (ref 13.0–17.0)
MCH: 31.7 pg (ref 26.0–34.0)
MCHC: 35 g/dL (ref 30.0–36.0)
MCV: 90.4 fL (ref 78.0–100.0)
PLATELETS: 209 10*3/uL (ref 150–400)
RBC: 4.39 MIL/uL (ref 4.22–5.81)
RDW: 12.2 % (ref 11.5–15.5)
WBC: 7.1 10*3/uL (ref 4.0–10.5)

## 2014-10-29 LAB — ABO/RH: ABO/RH(D): A POS

## 2014-10-30 LAB — HEMOGLOBIN A1C
Hgb A1c MFr Bld: 5.8 % — ABNORMAL HIGH (ref 4.8–5.6)
MEAN PLASMA GLUCOSE: 120 mg/dL

## 2014-11-02 ENCOUNTER — Inpatient Hospital Stay (HOSPITAL_COMMUNITY)
Admission: EM | Admit: 2014-11-02 | Discharge: 2014-11-08 | DRG: 331 | Disposition: A | Discharge status: Home / Self Care | Payer: BLUE CROSS/BLUE SHIELD | Attending: General Surgery | Admitting: General Surgery

## 2014-11-02 ENCOUNTER — Encounter (HOSPITAL_COMMUNITY): Payer: Self-pay | Admitting: Emergency Medicine

## 2014-11-02 DIAGNOSIS — Z9884 Bariatric surgery status: Secondary | ICD-10-CM | POA: Diagnosis not present

## 2014-11-02 DIAGNOSIS — Z87891 Personal history of nicotine dependence: Secondary | ICD-10-CM

## 2014-11-02 DIAGNOSIS — R1110 Vomiting, unspecified: Secondary | ICD-10-CM

## 2014-11-02 DIAGNOSIS — E860 Dehydration: Secondary | ICD-10-CM | POA: Diagnosis present

## 2014-11-02 DIAGNOSIS — Z87442 Personal history of urinary calculi: Secondary | ICD-10-CM | POA: Diagnosis not present

## 2014-11-02 DIAGNOSIS — G8929 Other chronic pain: Secondary | ICD-10-CM | POA: Diagnosis present

## 2014-11-02 DIAGNOSIS — R112 Nausea with vomiting, unspecified: Secondary | ICD-10-CM | POA: Diagnosis not present

## 2014-11-02 DIAGNOSIS — Z8249 Family history of ischemic heart disease and other diseases of the circulatory system: Secondary | ICD-10-CM | POA: Diagnosis not present

## 2014-11-02 DIAGNOSIS — Z01812 Encounter for preprocedural laboratory examination: Secondary | ICD-10-CM | POA: Diagnosis not present

## 2014-11-02 DIAGNOSIS — K5732 Diverticulitis of large intestine without perforation or abscess without bleeding: Secondary | ICD-10-CM | POA: Diagnosis present

## 2014-11-02 DIAGNOSIS — K5792 Diverticulitis of intestine, part unspecified, without perforation or abscess without bleeding: Secondary | ICD-10-CM

## 2014-11-02 DIAGNOSIS — R1032 Left lower quadrant pain: Secondary | ICD-10-CM | POA: Diagnosis present

## 2014-11-02 LAB — CBC
HCT: 46.1 % (ref 39.0–52.0)
HEMOGLOBIN: 16.3 g/dL (ref 13.0–17.0)
MCH: 32 pg (ref 26.0–34.0)
MCHC: 35.4 g/dL (ref 30.0–36.0)
MCV: 90.4 fL (ref 78.0–100.0)
Platelets: 251 10*3/uL (ref 150–400)
RBC: 5.1 MIL/uL (ref 4.22–5.81)
RDW: 12.3 % (ref 11.5–15.5)
WBC: 10 10*3/uL (ref 4.0–10.5)

## 2014-11-02 LAB — URINALYSIS, ROUTINE W REFLEX MICROSCOPIC
BILIRUBIN URINE: NEGATIVE
Glucose, UA: 100 mg/dL — AB
HGB URINE DIPSTICK: NEGATIVE
Ketones, ur: 15 mg/dL — AB
Leukocytes, UA: NEGATIVE
NITRITE: NEGATIVE
PROTEIN: NEGATIVE mg/dL
Specific Gravity, Urine: 1.013 (ref 1.005–1.030)
UROBILINOGEN UA: 0.2 mg/dL (ref 0.0–1.0)
pH: 8 (ref 5.0–8.0)

## 2014-11-02 LAB — COMPREHENSIVE METABOLIC PANEL
ALK PHOS: 66 U/L (ref 38–126)
ALT: 22 U/L (ref 17–63)
ANION GAP: 14 (ref 5–15)
AST: 26 U/L (ref 15–41)
Albumin: 4.2 g/dL (ref 3.5–5.0)
BILIRUBIN TOTAL: 0.5 mg/dL (ref 0.3–1.2)
BUN: 7 mg/dL (ref 6–20)
CALCIUM: 9.7 mg/dL (ref 8.9–10.3)
CO2: 19 mmol/L — ABNORMAL LOW (ref 22–32)
Chloride: 107 mmol/L (ref 101–111)
Creatinine, Ser: 0.71 mg/dL (ref 0.61–1.24)
GFR calc Af Amer: >60 mL/min (ref >60)
GLUCOSE: 134 mg/dL — AB (ref 65–99)
POTASSIUM: 3.8 mmol/L (ref 3.5–5.1)
Sodium: 140 mmol/L (ref 135–145)
TOTAL PROTEIN: 7.4 g/dL (ref 6.5–8.1)

## 2014-11-02 LAB — LIPASE, BLOOD: Lipase: 22 U/L (ref 22–51)

## 2014-11-02 MED ORDER — SODIUM CHLORIDE 0.9 % IV BOLUS (SEPSIS)
1000.0000 mL | Freq: Once | INTRAVENOUS | Status: AC
  Administered 2014-11-02: 1000 mL via INTRAVENOUS

## 2014-11-02 MED ORDER — ENOXAPARIN SODIUM 40 MG/0.4ML ~~LOC~~ SOLN
40.0000 mg | SUBCUTANEOUS | Status: DC
  Administered 2014-11-02: 40 mg via SUBCUTANEOUS
  Filled 2014-11-02 (×3): qty 0.4

## 2014-11-02 MED ORDER — PROMETHAZINE HCL 25 MG/ML IJ SOLN
25.0000 mg | Freq: Once | INTRAMUSCULAR | Status: AC
  Administered 2014-11-02: 25 mg via INTRAVENOUS
  Filled 2014-11-02: qty 1

## 2014-11-02 MED ORDER — ONDANSETRON HCL 4 MG/2ML IJ SOLN
4.0000 mg | Freq: Four times a day (QID) | INTRAMUSCULAR | Status: DC | PRN
  Administered 2014-11-02 – 2014-11-04 (×3): 4 mg via INTRAVENOUS
  Filled 2014-11-02 (×4): qty 2

## 2014-11-02 MED ORDER — SODIUM CHLORIDE 0.9 % IV SOLN
INTRAVENOUS | Status: DC
  Administered 2014-11-02: 100 mL/h via INTRAVENOUS
  Administered 2014-11-03 – 2014-11-04 (×3): via INTRAVENOUS

## 2014-11-02 MED ORDER — OXYCODONE HCL 5 MG PO TABS
5.0000 mg | ORAL_TABLET | ORAL | Status: DC | PRN
  Administered 2014-11-03 – 2014-11-04 (×4): 5 mg via ORAL
  Filled 2014-11-02 (×5): qty 1

## 2014-11-02 MED ORDER — ALBUTEROL SULFATE (2.5 MG/3ML) 0.083% IN NEBU: 2.5000 mg | INHALATION_SOLUTION | RESPIRATORY_TRACT | Status: DC | PRN

## 2014-11-02 MED ORDER — SODIUM CHLORIDE 0.9 % IV SOLN
3.0000 g | Freq: Four times a day (QID) | INTRAVENOUS | Status: DC
  Administered 2014-11-02 – 2014-11-05 (×10): 3 g via INTRAVENOUS
  Filled 2014-11-02 (×14): qty 3

## 2014-11-02 MED ORDER — ONDANSETRON HCL 4 MG/2ML IJ SOLN
4.0000 mg | Freq: Once | INTRAMUSCULAR | Status: AC
  Administered 2014-11-02: 4 mg via INTRAVENOUS
  Filled 2014-11-02: qty 2

## 2014-11-02 MED ORDER — ACETAMINOPHEN 650 MG RE SUPP: 650.0000 mg | Freq: Four times a day (QID) | RECTAL | Status: DC | PRN

## 2014-11-02 MED ORDER — ONDANSETRON HCL 4 MG PO TABS: 4.0000 mg | ORAL_TABLET | Freq: Four times a day (QID) | ORAL | Status: DC | PRN

## 2014-11-02 MED ORDER — HYDROMORPHONE HCL 1 MG/ML IJ SOLN
1.0000 mg | Freq: Once | INTRAMUSCULAR | Status: AC
  Administered 2014-11-02: 1 mg via INTRAVENOUS
  Filled 2014-11-02: qty 1

## 2014-11-02 MED ORDER — OXYCODONE-ACETAMINOPHEN 5-325 MG PO TABS
2.0000 | ORAL_TABLET | Freq: Once | ORAL | Status: AC
  Administered 2014-11-02: 2 via ORAL
  Filled 2014-11-02: qty 2

## 2014-11-02 MED ORDER — HYDROMORPHONE HCL 1 MG/ML IJ SOLN
1.0000 mg | INTRAMUSCULAR | Status: DC | PRN
Start: 2014-11-02 — End: 2014-11-05
  Administered 2014-11-02 – 2014-11-03 (×3): 2 mg via INTRAVENOUS
  Administered 2014-11-04: 1 mg via INTRAVENOUS
  Administered 2014-11-04 – 2014-11-05 (×3): 2 mg via INTRAVENOUS
  Filled 2014-11-02: qty 1
  Filled 2014-11-02 (×6): qty 2

## 2014-11-02 MED ORDER — METOCLOPRAMIDE HCL 5 MG/ML IJ SOLN
10.0000 mg | Freq: Once | INTRAMUSCULAR | Status: AC
  Administered 2014-11-02: 10 mg via INTRAVENOUS
  Filled 2014-11-02: qty 2

## 2014-11-02 MED ORDER — ACETAMINOPHEN 325 MG PO TABS: 650.0000 mg | ORAL_TABLET | Freq: Four times a day (QID) | ORAL | Status: DC | PRN

## 2014-11-02 NOTE — ED Notes (Signed)
Pt reports history of diverticulitis with surgery scheduled Monday. Pt with severe abdominal pain, N/V onset 0700 today. Pt last had an episode 1 week ago and usually goes to [place] long.

## 2014-11-02 NOTE — H&P (Signed)
[facility] History and Physical  Barry Parsons ZOX:[id number] DOB: 24-Jun-1975 DOA: 11/02/2014   PCP: Jani Gravel, MD  Specialists: Dr. Carol Ada is his gastroenterologist. Dr. Excell Seltzer is his general surgeon  Chief Complaint: Left-sided abdominal pain with nausea and vomiting  HPI: Barry Parsons is a 39 y.o. male with a past medical history of obesity status post lap band procedure, abdominal pain since May with subsequent diagnosis of diverticulitis in June and since then with chronic abdominal pain and nausea and vomiting. Patient comes in with complaints of left-sided abdominal pain. Onset was last night. It is described as a "hard pain, intense, steady"in the left lower quadrant of his abdomen. He's had similar pain on and off for the past 3-4 months. He's had multiple CT scans. He is thought to have diverticulitis which is not responding to medical treatment. Plan was for the patient to undergo surgery on Monday. Patient has had multiple episodes of vomiting. He had the loose stool last night and this morning but none since then. Denies any fever, chills. He has been on multiple courses of antibiotics recently including Augmentin and ciprofloxacin. Pain is 10 out of 10 in intensity. With pain medicine it did improve briefly to 8 out of 10 in intensity.  Home Medications: Prior to Admission medications   Medication Sig Start Date End Date Taking? Authorizing Provider  amoxicillin-clavulanate (AUGMENTIN) 875-125 MG per tablet Take 1 tablet by mouth 2 (two) times daily.  08/27/14  Yes Historical Provider, MD  ondansetron (ZOFRAN ODT) 8 MG disintegrating tablet Take 1 tablet (8 mg total) by mouth every 8 (eight) hours as needed for nausea or vomiting. 10/26/14  Yes Tatyana Kirichenko, PA-C  promethazine (PHENERGAN) 25 MG suppository Place 1 suppository (25 mg total) rectally every 6 (six) hours as needed for nausea or vomiting. 10/24/14  Yes Charlann Lange, PA-C  traMADol (ULTRAM) 50 MG  tablet Take 50 mg by mouth every 8 (eight) hours as needed for moderate pain or severe pain (pain).  07/03/10  Yes Historical Provider, MD  metroNIDAZOLE (FLAGYL) 500 MG tablet Take 1 tablet (500 mg total) by mouth 3 (three) times daily. Patient not taking: Reported on 10/26/2014 10/24/14   Charlann Lange, PA-C    Allergies: No Known Allergies  Past Medical History: Past Medical History  Diagnosis Date  . Arthritis   . Kidney stones   . Diverticulitis     Past Surgical History  Procedure Laterality Date  . Vasectomy  2009  . 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 Tonsillectomy      Social History: Lives in [place] with his family. He works for [employer]. Denies any smoking, alcohol use or illicit drug use. Usually independent with daily activities.  Family History:  Family History  Problem Relation Age of Onset  . Heart disease Mother   . Kidney Stones    . Gallstones       Review of Systems - History obtained from the patient General ROS: positive for  - fatigue Psychological ROS: negative Ophthalmic ROS: negative ENT ROS: negative Allergy and Immunology ROS: negative Hematological and Lymphatic ROS: negative Endocrine ROS: negative Respiratory ROS: no cough, shortness of breath, or wheezing Cardiovascular ROS: no chest pain or dyspnea on exertion Gastrointestinal ROS: as in hpi Genito-Urinary ROS: no dysuria, trouble voiding, or hematuria Musculoskeletal ROS: negative Neurological ROS: no TIA or stroke symptoms Dermatological ROS: negative  Physical Examination  Filed Vitals:   11/02/14 1330 11/02/14 1400 11/02/14 1445 11/02/14 1615  BP:  168/91 192/104 147/102 186/100  Pulse: 73 64 100 78  Temp:      TempSrc:      Resp: _0 SpO2: 96% 98% 100% 99%    BP 186/100 mmHg  Pulse 78  Temp(Src) 98.6 F (37 C) (Oral)  Resp 17  SpO2 99%  General appearance: alert, cooperative, appears stated age and no distress Head: Normocephalic,  without obvious abnormality, atraumatic Eyes: conjunctivae/corneas clear. PERRL, EOM's intact.  Throat: lips, mucosa, and tongue normal; teeth and gums normal Neck: no adenopathy, no carotid bruit, no JVD, supple, symmetrical, trachea midline and thyroid not enlarged, symmetric, no tenderness/mass/nodules Resp: clear to auscultation bilaterally Cardio: regular rate and rhythm, S1, S2 normal, no murmur, click, rub or gallop GI: Soft. Tender in the left lower quadrant without any rebound, rigidity or guarding. No masses or organomegaly. Bowel sounds are sluggish. Extremities: extremities normal, atraumatic, no cyanosis or edema Pulses: 2+ and symmetric Skin: Skin color, texture, turgor normal. No rashes or lesions Neurologic: No focal deficits. Alert and oriented 3.  Laboratory Data: Results for orders placed or performed during the hospital encounter of 11/02/14 (from the past 48 hour(s))  Lipase, blood     Status: None   Collection Time: 11/02/14 10:38 AM  Result Value Ref Range   Lipase 22 22 - 51 U/L  Comprehensive metabolic panel     Status: Abnormal   Collection Time: 11/02/14 10:38 AM  Result Value Ref Range   Sodium 140 135 - 145 mmol/L   Potassium 3.8 3.5 - 5.1 mmol/L   Chloride 107 101 - 111 mmol/L   CO2 19 (L) 22 - 32 mmol/L   Glucose, Bld 134 (H) 65 - 99 mg/dL   BUN 7 6 - 20 mg/dL   Creatinine, Ser 0.71 0.61 - 1.24 mg/dL   Calcium 9.7 8.9 - 10.3 mg/dL   Total Protein 7.4 6.5 - 8.1 g/dL   Albumin 4.2 3.5 - 5.0 g/dL   AST 26 15 - 41 U/L   ALT 22 17 - 63 U/L   Alkaline Phosphatase 66 38 - 126 U/L   Total Bilirubin 0.5 0.3 - 1.2 mg/dL   GFR calc non Af Amer >60 >60 mL/min   GFR calc Af Amer >60 >60 mL/min    Comment: (NOTE) The eGFR has been calculated using the CKD EPI equation. This calculation has not been validated in all clinical situations. eGFR's persistently <60 mL/min signify possible Chronic Kidney Disease.    Anion gap 14 5 - 15  CBC     Status: None    Collection Time: 11/02/14 10:38 AM  Result Value Ref Range   WBC 10.0 4.0 - 10.5 K/uL   RBC 5.10 4.22 - 5.81 MIL/uL   Hemoglobin 16.3 13.0 - 17.0 g/dL   HCT 46.1 39.0 - 52.0 %   MCV 90.4 78.0 - 100.0 fL   MCH 32.0 26.0 - 34.0 pg   MCHC 35.4 30.0 - 36.0 g/dL   RDW 12.3 11.5 - 15.5 %   Platelets 251 150 - 400 K/uL  Urinalysis, Routine w reflex microscopic (not at [facility])     Status: Abnormal   Collection Time: 11/02/14  1:13 PM  Result Value Ref Range   Color, Urine YELLOW YELLOW   APPearance CLEAR CLEAR   Specific Gravity, Urine 1.013 1.005 - 1.030   pH 8.0 5.0 - 8.0   Glucose, UA 100 (A) NEGATIVE mg/dL   Hgb urine dipstick NEGATIVE NEGATIVE   Bilirubin Urine  NEGATIVE NEGATIVE   Ketones, ur 15 (A) NEGATIVE mg/dL   Protein, ur NEGATIVE NEGATIVE mg/dL   Urobilinogen, UA 0.2 0.0 - 1.0 mg/dL   Nitrite NEGATIVE NEGATIVE   Leukocytes, UA NEGATIVE NEGATIVE    Comment: MICROSCOPIC NOT DONE ON URINES WITH NEGATIVE PROTEIN, BLOOD, LEUKOCYTES, NITRITE, OR GLUCOSE <1000 mg/dL.    Radiology Reports: No results found.   Problem List  Principal Problem:   Acute diverticulitis Active Problems:   LAP-BAND surgery status   Nausea & vomiting   Assessment: This is a 39 year old Caucasian male with past medical history as stated earlier, who presents with uncontrolled left-sided abdominal pain with nausea and vomiting. Patient has a history of diverticulitis, which has failed medical management and plan is for surgical intervention on Monday. Attempts to stabilize the patient in the ED and discharge him home have not been very successful. Patient continues to have nausea and vomiting. Hence, he will need to be hospitalized.  Plan: #1 acute diverticulitis with intractable nausea and vomiting: His abdominal pain is restricted to the left lower quadrant. He is tender only in that area without any rebound, rigidity or guarding. He is afebrile with a normal white count. According to patient his pain  doesn't feel different from his previous episodes. At this time there is no clear indication to do an imaging study. If his symptoms start changing in character or if he starts spiking fevers, then consideration may be given to obtaining a CT scan. In the meantime, we will place him on intravenous Unasyn. He'll be given pain medications and antiemetics. He'll be given IV fluids. He'll be kept nothing by mouth. I have already discussed the situation with Dr. Excell Seltzer who was working tomorrow at [facility]. He agrees with the plan and requests that the patient be transferred to that facility and he will see the patient tomorrow morning with plans to operate either this weekend or Monday.  #2 Intractable nausea, vomiting: Most likely secondary to the above. Symptomatic treatment. Nothing by mouth status.  #3 Dehydration secondary to nausea, vomiting: Bicarbonate noted to be slightly low. Will be rechecked after hydration.   DVT Prophylaxis: Lovenox for now Code Status: Full code Family Communication: Discussed with the patient  Disposition Plan: Transfer to [facility].   Further management decisions will depend on results of further testing and patient's response to treatment.   [facility]  [facility] Pager [phone]  If 7PM-7AM, please contact night-coverage [email or web address] Password [email or web address]  11/02/2014, 5:22 PM

## 2014-11-02 NOTE — Progress Notes (Signed)
ANTIBIOTIC CONSULT NOTE - INITIAL  Pharmacy Consult for Unasyn Indication: intra-abdominal infection  No Known Allergies  Patient Measurements:   Adjusted Body Weight:   Vital Signs: Temp: 98.6 F (37 C) (10/07 1233) Temp Source: Oral (10/07 1233) BP: 163/96 mmHg (10/07 1722) Pulse Rate: 96 (10/07 1723) Intake/Output from previous day:   Intake/Output from this shift: Total I/O In: 1000 [I.V.:1000] Out: -   Labs:  Recent Labs  11/02/14 1038  WBC 10.0  HGB 16.3  PLT 251  CREATININE 0.71   Estimated Creatinine Clearance: 155 mL/min (by C-G formula based on Cr of 0.71). No results for input(s): VANCOTROUGH, VANCOPEAK, VANCORANDOM, GENTTROUGH, GENTPEAK, GENTRANDOM, TOBRATROUGH, TOBRAPEAK, TOBRARND, AMIKACINPEAK, AMIKACINTROU, AMIKACIN in the last 72 hours.   Microbiology: No results found for this or any previous visit (from the past 720 hour(s)).  Medical History: Past Medical History  Diagnosis Date  . Arthritis   . Kidney stones   . Diverticulitis     Medications:   (Not in a hospital admission) Scheduled:   Infusions:  . ampicillin-sulbactam (UNASYN) IV     Assessment: Barry Parsons with history of obesity s/p lab band procedure presents with L-sided abdominal pain with N/V. Pharmacy is consulted to dose unasyn for suspected intra-abdominal infection. Pt is afebrile, WBC 10, sCr 0.71, UA negative.   Goal of Therapy:  Eradication of infection  Plan:  Unasyn 3g IV q6h Follow up culture results, renal function and clinical course  Arlean Hopping. Newman Pies, PharmD Clinical Pharmacist Pager [phone] 11/02/2014,5:51 PM

## 2014-11-02 NOTE — ED Notes (Signed)
Family at bedside. 

## 2014-11-02 NOTE — ED Provider Notes (Signed)
CSN: [id number]     Arrival date & time 11/02/14  4098 History   First MD Initiated Contact with Patient 11/02/14 1013     Chief Complaint  Patient presents with  . Diverticulitis  . Nausea  . Vomiting     (Consider location/radiation/quality/duration/timing/severity/associated sxs/prior Treatment) HPI Comments: Patient with a history of Diverticulitis presents today with a chief complaint of LLQ abdominal pain.  He reports onset of pain this morning.  He states that the pain is constant and gradually worsening since that time.  He states that the pain is similar to the pain that he has had in the past with a flare up.  He was seen in the ED for the same on 10/24/14 and 10/26/14.  On 9/28 he was started on Cipro and Flagyl.  He states that he completed the Cipro last evening, but did not take the Flagyl.  He also reports that he was recently started on a course of Augmentin by GI, but did not finish this course.   He reports that symptoms are associated with nausea and vomiting.  He took a Vicodin for the pain, but reports vomiting shortly after taking the medication.  He also reports taking Phenergan and Zofran for the nausea without improvement.  He also reports associated diarrhea.  He has a Partial colectomy scheduled with [facility] in three days.  He denies fever, chills, urinary complaints, hematochezia, melena, or hematemesis.  The history is provided by the patient.    Past Medical History  Diagnosis Date  . Arthritis   . Kidney stones   . Diverticulitis    Past Surgical History  Procedure Laterality Date  . Vasectomy  2009  . 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 Tonsillectomy     Family History  Problem Relation Age of Onset  . Heart disease Mother   . Kidney Stones    . Gallstones     Social History  Substance Use Topics  . Smoking status: Former Smoker -- 0.25 packs/day    Types: Cigarettes    Quit date: 01/26/2013  . Smokeless tobacco: Never Used  . Alcohol Use: Yes      Comment: occasional / NONE SINCE JUNE    Review of Systems  All other systems reviewed and are negative.     Allergies  Review of patient's allergies indicates no known allergies.  Home Medications   Prior to Admission medications   Medication Sig Start Date End Date Taking? Authorizing Provider  amoxicillin-clavulanate (AUGMENTIN) 875-125 MG per tablet Take 1 tablet by mouth 2 (two) times daily.  08/27/14  Yes Historical Provider, MD  ondansetron (ZOFRAN ODT) 8 MG disintegrating tablet Take 1 tablet (8 mg total) by mouth every 8 (eight) hours as needed for nausea or vomiting. 10/26/14  Yes Tatyana Kirichenko, PA-C  promethazine (PHENERGAN) 25 MG suppository Place 1 suppository (25 mg total) rectally every 6 (six) hours as needed for nausea or vomiting. 10/24/14  Yes Elpidio Anis, PA-C  traMADol (ULTRAM) 50 MG tablet Take 50 mg by mouth every 8 (eight) hours as needed for moderate pain or severe pain (pain).  07/03/10  Yes Historical Provider, MD  metroNIDAZOLE (FLAGYL) 500 MG tablet Take 1 tablet (500 mg total) by mouth 3 (three) times daily. Patient not taking: Reported on 10/26/2014 10/24/14   Elpidio Anis, PA-C   BP 173/106 mmHg  Pulse 81  Temp(Src) 98.7 F (37.1 C) (Oral)  Resp 20  SpO2 100% Physical Exam  Constitutional: He appears well-developed and  well-nourished.  HENT:  Head: Normocephalic and atraumatic.  Mouth/Throat: Oropharynx is clear and moist.  Neck: Normal range of motion. Neck supple.  Cardiovascular: Normal rate, regular rhythm and normal heart sounds.   Pulmonary/Chest: Effort normal and breath sounds normal.  Abdominal: Soft. Bowel sounds are normal. He exhibits no distension and no mass. There is tenderness in the left lower quadrant. There is no rebound and no guarding.  Musculoskeletal: Normal range of motion.  Neurological: He is alert.  Skin: Skin is warm and dry.  Psychiatric: He has a normal mood and affect.  Nursing note and vitals  reviewed.   ED Course  Procedures (including critical care time) Labs Review Labs Reviewed  LIPASE, BLOOD  COMPREHENSIVE METABOLIC PANEL  CBC  URINALYSIS, ROUTINE W REFLEX MICROSCOPIC (NOT AT [facility])    Imaging Review No results found. I have personally reviewed and evaluated these images and lab results as part of my medical decision-making.   EKG Interpretation None     4:33 PM Reassessed patient.  Patient unable to tolerate PO pain medications or drink fluids after three doses of Zofran and Phenergan. 4:53 PM Discussed with Dr. Rito Ehrlich who is going to come see the patient in the ED.   MDM   Final diagnoses:  None   Patient with a history of Diverticulitis scheduled to have a partial colectomy in three days presents today with a chief complaint of LLQ abdominal pain.  He has numerous previous ED visits for the same.  He reports that the pain that he is having today is the same pain that he has had din the past.  He has had four CT scans in the past 6 months for similar pain.  Most recent CT scan not showing any evidence of Diverticulitis.  He is afebrile.  No peritonitis on exam.  No leukocytosis.  Therefore, doubt perforation or abscess.  Do not feel that additional imaging is indicated at this time. Pain associated with nausea and vomiting.  Patient given Zofran x 3 and Phenergan in the ED without improvement of nausea.  Patient unable to tolerate PO liquids.  Therefore, patient admitted to [facility].      Santiago Glad, PA-C 11/04/14 1812  Lavera Guise, MD 11/05/14 [phone]

## 2014-11-02 NOTE — Progress Notes (Signed)
RN received report from Clearence Cheek ED RN, Pt arrived unit via care link. Alert and oriented. MD notified of Pt's location. Will continue with current plan of care.

## 2014-11-03 DIAGNOSIS — Z9884 Bariatric surgery status: Secondary | ICD-10-CM

## 2014-11-03 DIAGNOSIS — R112 Nausea with vomiting, unspecified: Secondary | ICD-10-CM

## 2014-11-03 DIAGNOSIS — K5792 Diverticulitis of intestine, part unspecified, without perforation or abscess without bleeding: Secondary | ICD-10-CM

## 2014-11-03 LAB — BASIC METABOLIC PANEL
ANION GAP: 9 (ref 5–15)
BUN: 8 mg/dL (ref 6–20)
CHLORIDE: 107 mmol/L (ref 101–111)
CO2: 25 mmol/L (ref 22–32)
Calcium: 8.5 mg/dL — ABNORMAL LOW (ref 8.9–10.3)
Creatinine, Ser: 0.59 mg/dL — ABNORMAL LOW (ref 0.61–1.24)
GFR calc Af Amer: >60 mL/min (ref >60)
GLUCOSE: 93 mg/dL (ref 65–99)
POTASSIUM: 3.3 mmol/L — AB (ref 3.5–5.1)
Sodium: 141 mmol/L (ref 135–145)

## 2014-11-03 LAB — CBC
HEMATOCRIT: 42.2 % (ref 39.0–52.0)
HEMOGLOBIN: 14.5 g/dL (ref 13.0–17.0)
MCH: 31.3 pg (ref 26.0–34.0)
MCHC: 34.4 g/dL (ref 30.0–36.0)
MCV: 91.1 fL (ref 78.0–100.0)
Platelets: 241 10*3/uL (ref 150–400)
RBC: 4.63 MIL/uL (ref 4.22–5.81)
RDW: 12.6 % (ref 11.5–15.5)
WBC: 11.1 10*3/uL — ABNORMAL HIGH (ref 4.0–10.5)

## 2014-11-03 LAB — CORTISOL: Cortisol, Plasma: 7.7 ug/dL

## 2014-11-03 MED ORDER — ZOLPIDEM TARTRATE 5 MG PO TABS
5.0000 mg | ORAL_TABLET | Freq: Once | ORAL | Status: AC
  Administered 2014-11-03: 5 mg via ORAL
  Filled 2014-11-03: qty 1

## 2014-11-03 MED ORDER — POLYETHYLENE GLYCOL 3350 17 GM/SCOOP PO POWD
1.0000 | Freq: Once | ORAL | Status: DC
  Filled 2014-11-03: qty 255

## 2014-11-03 MED ORDER — ZOLPIDEM TARTRATE 5 MG PO TABS
5.0000 mg | ORAL_TABLET | Freq: Every evening | ORAL | Status: DC | PRN
  Administered 2014-11-03 – 2014-11-07 (×5): 5 mg via ORAL
  Filled 2014-11-03 (×5): qty 1

## 2014-11-03 MED ORDER — POLYETHYLENE GLYCOL 3350 17 GM/SCOOP PO POWD
1.0000 | Freq: Once | ORAL | Status: AC
  Administered 2014-11-04: 255 g via ORAL
  Filled 2014-11-03: qty 255

## 2014-11-03 MED ORDER — ENOXAPARIN SODIUM 60 MG/0.6ML ~~LOC~~ SOLN
55.0000 mg | SUBCUTANEOUS | Status: AC
Start: 2014-11-03 — End: 2014-11-04
  Administered 2014-11-03 – 2014-11-04 (×2): 55 mg via SUBCUTANEOUS
  Filled 2014-11-03 (×2): qty 0.6

## 2014-11-03 NOTE — Progress Notes (Signed)
General Surgery Note  LOS: 1 day  POD -     Assessment/Plan: 1.  Recurrent diverticulitis  Dr. Johna Sheriff has him on for colon resection on 11/05/2014  Unasyn - 10/7 >>>>  Start clear liquids.  Bowel prep tomorrow.  Surgery scheduled for Monday (10/10)  I spent 15 minutes answering questions about the surgery and post op course.   2.  History of lap band - 2012 3.  DVT prophylaxis - Lovenox   Principal Problem:   Acute diverticulitis Active Problems:   LAP-BAND surgery status   Nausea & vomiting   Subjective:  Sore left lower quadrant.  But has no peritoneal signs. Wants some liquids.  Objective:   Filed Vitals:   11/03/14 0609  BP: 123/67  Pulse: 68  Temp: 98.3 F (36.8 C)  Resp: 18     Intake/Output from previous day:  10/07 0701 - 10/08 0700 In: 1000 [I.V.:1000] Out: -   Intake/Output this shift:      Physical Exam:   General: Somewhat heavy WM who is alert and oriented.    HEENT: Normal. Pupils equal. .   Lungs: Clear   Abdomen: Soft, though sore LLQ   Lab Results:    Recent Labs  11/02/14 1038 11/03/14 0510  WBC 10.0 11.1*  HGB 16.3 14.5  HCT 46.1 42.2  PLT 251 241    BMET   Recent Labs  11/02/14 1038 11/03/14 0510  NA 140 141  K 3.8 3.3*  CL 107 107  CO2 19* 25  GLUCOSE 134* 93  BUN 7 8  CREATININE 0.71 0.59*  CALCIUM 9.7 8.5*    PT/INR  No results for input(s): LABPROT, INR in the last 72 hours.  ABG  No results for input(s): PHART, HCO3 in the last 72 hours.  Invalid input(s): PCO2, PO2   Studies/Results:  No results found.   Anti-infectives:   Anti-infectives    Start     Dose/Rate Route Frequency Ordered Stop   11/02/14 1800  Ampicillin-Sulbactam (UNASYN) 3 g in sodium chloride 0.9 % 100 mL IVPB     3 g 100 mL/hr over 60 Minutes Intravenous Every 6 hours 11/02/14 1751        [name], MD, FACS Pager: [phone] [facility] Office: [phone] 11/03/2014

## 2014-11-03 NOTE — Progress Notes (Signed)
[facility] PROGRESS NOTE  Barry Parsons ZOX:[id number] DOB: 06/16/1975 DOA: 11/02/2014 PCP: Pearson Grippe, MD  Assessment/Plan:  1 Recurrent N/V/Abd pain and diarrhea -h/o multiple episodes of diverticulitis and or gastroenteritis -do not see record of colonoscopy -followed by Dr.Hoxworth, felt to have failed medical Rx of diverticulitis -continue IVF, Unasyn, [facility] to see - #2 s/p Lap Banding in 2012  #3 Dehydration secondary to nausea, vomiting: Bicarbonate noted to be slightly low. Will be rechecked after hydration.  DVT Prophylaxis: Lovenox   Code Status: Full code Family Communication: none at bedside Disposition Plan: per [facility]  Consultants:  Dr.Hoxworth  HPI/Subjective: Intermittent L sided and pain, BM yesterday   Objective: Filed Vitals:   11/03/14 0609  BP: 123/67  Pulse: 68  Temp: 98.3 F (36.8 C)  Resp: 18    Intake/Output Summary (Last 24 hours) at 11/03/14 1136 Last data filed at 11/03/14 0900  Gross per 24 hour  Intake   1000 ml  Output      0 ml  Net   1000 ml   There were no vitals filed for this visit.  Exam:   General: AAOx3, over weight  Cardiovascular: S12s/RRR  Respiratory: CTAB  Abdomen: obese, soft, NT, BS present  Musculoskeletal: no edema   Data Reviewed: Basic Metabolic Panel:  Recent Labs Lab 10/29/14 0955 11/02/14 1038 11/03/14 0510  NA 138 140 141  K 4.0 3.8 3.3*  CL 105 107 107  CO2 27 19* 25  GLUCOSE 98 134* 93  BUN CREATININE 0.79 0.71 0.59*  CALCIUM 9.0 9.7 8.5*   Liver Function Tests:  Recent Labs Lab 11/02/14 1038  AST 26  ALT 22  ALKPHOS 66  BILITOT 0.5  PROT 7.4  ALBUMIN 4.2    Recent Labs Lab 11/02/14 1038  LIPASE 22   No results for input(s): AMMONIA in the last 168 hours. CBC:  Recent Labs Lab 10/29/14 0955 11/02/14 1038 11/03/14 0510  WBC 7.1 10.0 11.1*  HGB 13.9 16.3 14.5  HCT 39.7 46.1 42.2  MCV 90.4 90.4 91.1  PLT 209 251 241   Cardiac Enzymes: No results  for input(s): CKTOTAL, CKMB, CKMBINDEX, TROPONINI in the last 168 hours. BNP (last 3 results) No results for input(s): BNP in the last 8760 hours.  ProBNP (last 3 results) No results for input(s): PROBNP in the last 8760 hours.  CBG: No results for input(s): GLUCAP in the last 168 hours.  No results found for this or any previous visit (from the past 240 hour(s)).   Studies: No results found.  Scheduled Meds: . ampicillin-sulbactam (UNASYN) IV  3 g Intravenous Q6H  . enoxaparin (LOVENOX) injection  55 mg Subcutaneous Q24H   Continuous Infusions: . sodium chloride 100 mL/hr at 11/03/14 0604   Antibiotics Given (last 72 hours)    Date/Time Action Medication Dose Rate   11/02/14 2017 Given   Ampicillin-Sulbactam (UNASYN) 3 g in sodium chloride 0.9 % 100 mL IVPB 3 g 100 mL/hr   11/03/14 0604 Given   Ampicillin-Sulbactam (UNASYN) 3 g in sodium chloride 0.9 % 100 mL IVPB 3 g 100 mL/hr      Principal Problem:   Acute diverticulitis Active Problems:   LAP-BAND surgery status   Nausea & vomiting    Time spent:    [facility]  [facility] Pager [phone]. If 7PM-7AM, please contact night-coverage at [email or web address], password [facility] 11/03/2014, 11:36 AM  LOS: 1 day

## 2014-11-04 LAB — MRSA PCR SCREENING: MRSA BY PCR: NEGATIVE

## 2014-11-04 MED ORDER — ALVIMOPAN 12 MG PO CAPS
12.0000 mg | ORAL_CAPSULE | Freq: Once | ORAL | Status: DC
  Filled 2014-11-04: qty 1

## 2014-11-04 MED ORDER — NEOMYCIN SULFATE 500 MG PO TABS
1000.0000 mg | ORAL_TABLET | Freq: Once | ORAL | Status: AC
  Administered 2014-11-04: 1000 mg via ORAL
  Filled 2014-11-04: qty 2

## 2014-11-04 MED ORDER — CHLORHEXIDINE GLUCONATE CLOTH 2 % EX PADS
6.0000 | MEDICATED_PAD | Freq: Once | CUTANEOUS | Status: AC
  Administered 2014-11-04: 6 via TOPICAL

## 2014-11-04 MED ORDER — ALVIMOPAN 12 MG PO CAPS
12.0000 mg | ORAL_CAPSULE | Freq: Once | ORAL | Status: AC
  Administered 2014-11-05: 12 mg via ORAL
  Filled 2014-11-04 (×2): qty 1

## 2014-11-04 MED ORDER — POTASSIUM CHLORIDE CRYS ER 20 MEQ PO TBCR
20.0000 meq | EXTENDED_RELEASE_TABLET | Freq: Two times a day (BID) | ORAL | Status: AC
  Administered 2014-11-04 (×2): 20 meq via ORAL
  Filled 2014-11-04 (×2): qty 1

## 2014-11-04 MED ORDER — NEOMYCIN SULFATE 500 MG PO TABS
1000.0000 mg | ORAL_TABLET | ORAL | Status: AC
  Administered 2014-11-04: 1000 mg via ORAL
  Filled 2014-11-04 (×5): qty 2

## 2014-11-04 MED ORDER — ERYTHROMYCIN BASE 250 MG PO TABS
1000.0000 mg | ORAL_TABLET | Freq: Once | ORAL | Status: AC
  Administered 2014-11-04: 1000 mg via ORAL
  Filled 2014-11-04: qty 4

## 2014-11-04 MED ORDER — ERYTHROMYCIN BASE 250 MG PO TABS
1000.0000 mg | ORAL_TABLET | Freq: Once | ORAL | Status: AC
Start: 2014-11-05 — End: 2014-11-04
  Administered 2014-11-04: 1000 mg via ORAL
  Filled 2014-11-04: qty 4

## 2014-11-04 MED ORDER — PROMETHAZINE HCL 25 MG/ML IJ SOLN
12.5000 mg | Freq: Four times a day (QID) | INTRAMUSCULAR | Status: DC | PRN
  Administered 2014-11-04 – 2014-11-05 (×2): 12.5 mg via INTRAVENOUS
  Filled 2014-11-04 (×2): qty 1

## 2014-11-04 MED ORDER — PEG 3350-KCL-NA BICARB-NACL 420 G PO SOLR
4000.0000 mL | Freq: Once | ORAL | Status: AC
  Administered 2014-11-04: 4000 mL via ORAL

## 2014-11-04 MED ORDER — POTASSIUM CHLORIDE 20 MEQ PO PACK
20.0000 meq | PACK | Freq: Two times a day (BID) | ORAL | Status: DC
  Filled 2014-11-04 (×2): qty 1

## 2014-11-04 MED ORDER — ERYTHROMYCIN BASE 250 MG PO TABS
1000.0000 mg | ORAL_TABLET | ORAL | Status: AC
  Administered 2014-11-04: 1000 mg via ORAL
  Filled 2014-11-04 (×5): qty 4

## 2014-11-04 NOTE — Anesthesia Preprocedure Evaluation (Addendum)
Anesthesia Evaluation  Patient identified by MRN, date of birth, ID band Patient awake    Reviewed: Allergy & Precautions, H&P , NPO status , Patient's Chart, lab work & pertinent test results  Airway Mallampati: II  TM Distance: >3 FB Neck ROM: Full    Dental no notable dental hx. (+) Teeth Intact, Dental Advisory Given   Pulmonary neg pulmonary ROS, former smoker,    Pulmonary exam normal breath sounds clear to auscultation       Cardiovascular negative cardio ROS   Rhythm:Regular Rate:Normal     Neuro/Psych negative neurological ROS  negative psych ROS   GI/Hepatic negative GI ROS, Neg liver ROS,   Endo/Other  negative endocrine ROS  Renal/GU negative Renal ROS  negative genitourinary   Musculoskeletal  (+) Arthritis , Osteoarthritis,    Abdominal   Peds  Hematology negative hematology ROS (+)   Anesthesia Other Findings   Reproductive/Obstetrics negative OB ROS                            Anesthesia Physical Anesthesia Plan  ASA: II  Anesthesia Plan: General   Post-op Pain Management:    Induction: Intravenous  Airway Management Planned: Oral ETT  Additional Equipment:   Intra-op Plan:   Post-operative Plan: Extubation in OR  Informed Consent: I have reviewed the patients History and Physical, chart, labs and discussed the procedure including the risks, benefits and alternatives for the proposed anesthesia with the patient or authorized representative who has indicated his/her understanding and acceptance.   Dental advisory given  Plan Discussed with: CRNA  Anesthesia Plan Comments:         Anesthesia Quick Evaluation

## 2014-11-04 NOTE — Progress Notes (Signed)
[facility] PROGRESS NOTE  Barry Parsons ZOX:[id number] DOB: October 28, 1975 DOA: 11/02/2014 PCP: Pearson Grippe, MD  Assessment/Plan:  1 Recurrent N/V/Abd pain and diarrhea -h/o multiple episodes of diverticulitis and or gastroenteritis -do not see record of colonoscopy -followed by Dr.Hoxworth, felt to have failed medical Rx of diverticulitis -continue IVF, Unasyn, [facility] following -bowel prep today, plan for sigmoid colectomy 10/10 as planned perviously  #2 s/p Lap Banding in 2012  #3 Dehydration secondary to nausea, vomiting: -resolved  DVT Prophylaxis: Lovenox   Code Status: Full code Family Communication: none at bedside Disposition Plan: per [facility]  Consultants:  Dr.Hoxworth  HPI/Subjective: Doing better, no much pain, no N/V now  Objective: Filed Vitals:   11/04/14 0556  BP: 129/66  Pulse: 58  Temp: 97.6 F (36.4 C)  Resp: 18    Intake/Output Summary (Last 24 hours) at 11/04/14 1025 Last data filed at 11/03/14 2116  Gross per 24 hour  Intake   1320 ml  Output      0 ml  Net   1320 ml   There were no vitals filed for this visit.  Exam:   General: AAOx3, over weight  Cardiovascular: S12s/RRR  Respiratory: CTAB  Abdomen: obese, soft, NT, BS present  Musculoskeletal: no edema   Data Reviewed: Basic Metabolic Panel:  Recent Labs Lab 10/29/14 0955 11/02/14 1038 11/03/14 0510  NA 138 140 141  K 4.0 3.8 3.3*  CL 105 107 107  CO2 27 19* 25  GLUCOSE 98 134* 93  BUN CREATININE 0.79 0.71 0.59*  CALCIUM 9.0 9.7 8.5*   Liver Function Tests:  Recent Labs Lab 11/02/14 1038  AST 26  ALT 22  ALKPHOS 66  BILITOT 0.5  PROT 7.4  ALBUMIN 4.2    Recent Labs Lab 11/02/14 1038  LIPASE 22   No results for input(s): AMMONIA in the last 168 hours. CBC:  Recent Labs Lab 10/29/14 0955 11/02/14 1038 11/03/14 0510  WBC 7.1 10.0 11.1*  HGB 13.9 16.3 14.5  HCT 39.7 46.1 42.2  MCV 90.4 90.4 91.1  PLT 209 251 241   Cardiac  Enzymes: No results for input(s): CKTOTAL, CKMB, CKMBINDEX, TROPONINI in the last 168 hours. BNP (last 3 results) No results for input(s): BNP in the last 8760 hours.  ProBNP (last 3 results) No results for input(s): PROBNP in the last 8760 hours.  CBG: No results for input(s): GLUCAP in the last 168 hours.  Recent Results (from the past 240 hour(s))  MRSA PCR Screening     Status: None   Collection Time: 11/04/14  6:48 AM  Result Value Ref Range Status   MRSA by PCR NEGATIVE NEGATIVE Final    Comment:        The GeneXpert MRSA Assay (FDA approved for NASAL specimens only), is one component of a comprehensive MRSA colonization surveillance program. It is not intended to diagnose MRSA infection nor to guide or monitor treatment for MRSA infections.      Studies: No results found.  Scheduled Meds: . [START ON 11/05/2014] alvimopan  12 mg Oral Once  . ampicillin-sulbactam (UNASYN) IV  3 g Intravenous Q6H  . Chlorhexidine Gluconate Cloth  6 each Topical Once   And  . Chlorhexidine Gluconate Cloth  6 each Topical Once  . enoxaparin (LOVENOX) injection  55 mg Subcutaneous Q24H  . neomycin  1,000 mg Oral 3 times per day   And  . erythromycin  1,000 mg Oral 3 times per day  . potassium  chloride  20 mEq Oral BID   Continuous Infusions: . sodium chloride 50 mL/hr at 11/04/14 0833   Antibiotics Given (last 72 hours)    Date/Time Action Medication Dose Rate   11/02/14 2017 Given   Ampicillin-Sulbactam (UNASYN) 3 g in sodium chloride 0.9 % 100 mL IVPB 3 g 100 mL/hr   11/03/14 0604 Given   Ampicillin-Sulbactam (UNASYN) 3 g in sodium chloride 0.9 % 100 mL IVPB 3 g 100 mL/hr   11/03/14 1244 Given   Ampicillin-Sulbactam (UNASYN) 3 g in sodium chloride 0.9 % 100 mL IVPB 3 g 100 mL/hr   11/03/14 1745 Given   Ampicillin-Sulbactam (UNASYN) 3 g in sodium chloride 0.9 % 100 mL IVPB 3 g 100 mL/hr   11/04/14 0052 Given   Ampicillin-Sulbactam (UNASYN) 3 g in sodium chloride 0.9 % 100  mL IVPB 3 g 100 mL/hr   11/04/14 0640 Given   Ampicillin-Sulbactam (UNASYN) 3 g in sodium chloride 0.9 % 100 mL IVPB 3 g 100 mL/hr      Principal Problem:   Acute diverticulitis Active Problems:   LAP-BAND surgery status   Nausea & vomiting    Time spent:    [facility]  [facility] Pager [phone]. If 7PM-7AM, please contact night-coverage at [email or web address], password [facility] 11/04/2014, 10:25 AM  LOS: 2 days

## 2014-11-04 NOTE — Progress Notes (Signed)
Patient ID: Barry Parsons, male   DOB: 01/26/1976, 39 y.o.   MRN: [id number]    Subjective: Still some left lower quadrant pain but much better than at admission. No other complaints.  Objective: Vital signs in last 24 hours: Temp:  [97.6 F (36.4 C)-98.3 F (36.8 C)] 97.6 F (36.4 C) (10/09 0556) Pulse Rate:  [58-72] 58 (10/09 0556) Resp:  [18] 18 (10/09 0556) BP: (126-147)/(65-88) 129/66 mmHg (10/09 0556) SpO2:  [99 %-100 %] 100 % (10/09 0556)    Intake/Output from previous day: 10/08 0701 - 10/09 0700 In: 1320 [P.O.:120; I.V.:1200] Out: -  Intake/Output this shift:    General appearance: alert, cooperative and no distress Resp: no wheezing or increased work of breathing GI: mild left lower quadrant tenderness. No guarding. No distention.  Lab Results:   Recent Labs  11/02/14 1038 11/03/14 0510  WBC 10.0 11.1*  HGB 16.3 14.5  HCT 46.1 42.2  PLT 251 241   BMET  Recent Labs  11/02/14 1038 11/03/14 0510  NA 140 141  K 3.8 3.3*  CL 107 107  CO2 19* 25  GLUCOSE 134* 93  BUN 7 8  CREATININE 0.71 0.59*  CALCIUM 9.7 8.5*     Studies/Results: No results found.  Anti-infectives: Anti-infectives    Start     Dose/Rate Route Frequency Ordered Stop   11/02/14 1800  Ampicillin-Sulbactam (UNASYN) 3 g in sodium chloride 0.9 % 100 mL IVPB     3 g 100 mL/hr over 60 Minutes Intravenous Every 6 hours 11/02/14 1751        Assessment/Plan: Patient well known to me with months of repeated episodes of left lower quadrant abdominal pain, diverticulosis and at least one episode of diverticulitis confirmed by CT scan and colonoscopy showing inflammatory change in the same area. Admitted just prior to planned elective sigmoid colectomy with another episode. This appears to be mild and resolving. I think it is fine to proceed with planned sigmoid colectomy tomorrow. Have ordered mechanical and antibiotic bowel prep today. Replace potassium. All his questions were  answered.    LOS: 2 days    Senta Kantor T 11/04/2014

## 2014-11-05 ENCOUNTER — Inpatient Hospital Stay (HOSPITAL_COMMUNITY): Payer: BLUE CROSS/BLUE SHIELD | Admitting: Anesthesiology

## 2014-11-05 ENCOUNTER — Encounter (HOSPITAL_COMMUNITY)
Admission: EM | Disposition: A | Discharge status: Home / Self Care | Payer: Self-pay | Source: Home / Self Care | Attending: General Surgery

## 2014-11-05 ENCOUNTER — Encounter (HOSPITAL_COMMUNITY): Payer: Self-pay | Admitting: Certified Registered Nurse Anesthetist

## 2014-11-05 ENCOUNTER — Inpatient Hospital Stay (HOSPITAL_COMMUNITY)
Admission: RE | Admit: 2014-11-05 | Payer: BLUE CROSS/BLUE SHIELD | Source: Ambulatory Visit | Admitting: General Surgery

## 2014-11-05 HISTORY — PX: LAPAROSCOPIC SIGMOID COLECTOMY: SHX5928

## 2014-11-05 LAB — TYPE AND SCREEN
ABO/RH(D): A POS
ABO/RH(D): A POS
ANTIBODY SCREEN: NEGATIVE
Antibody Screen: NEGATIVE

## 2014-11-05 LAB — BASIC METABOLIC PANEL
ANION GAP: 8 (ref 5–15)
BUN: 7 mg/dL (ref 6–20)
CHLORIDE: 107 mmol/L (ref 101–111)
CO2: 24 mmol/L (ref 22–32)
Calcium: 9 mg/dL (ref 8.9–10.3)
Creatinine, Ser: 0.72 mg/dL (ref 0.61–1.24)
GFR calc Af Amer: >60 mL/min (ref >60)
Glucose, Bld: 92 mg/dL (ref 65–99)
POTASSIUM: 3.6 mmol/L (ref 3.5–5.1)
SODIUM: 139 mmol/L (ref 135–145)

## 2014-11-05 SURGERY — COLECTOMY, SIGMOID, LAPAROSCOPIC
Anesthesia: General

## 2014-11-05 MED ORDER — FENTANYL CITRATE (PF) 100 MCG/2ML IJ SOLN
INTRAMUSCULAR | Status: AC
  Filled 2014-11-05: qty 2

## 2014-11-05 MED ORDER — FENTANYL CITRATE (PF) 250 MCG/5ML IJ SOLN
INTRAMUSCULAR | Status: AC
  Filled 2014-11-05: qty 25

## 2014-11-05 MED ORDER — MEPERIDINE HCL 50 MG/ML IJ SOLN
6.2500 mg | INTRAMUSCULAR | Status: DC | PRN
  Administered 2014-11-05 (×2): 12.5 mg via INTRAVENOUS

## 2014-11-05 MED ORDER — MEPERIDINE HCL 50 MG/ML IJ SOLN
INTRAMUSCULAR | Status: AC
  Filled 2014-11-05: qty 1

## 2014-11-05 MED ORDER — PROPOFOL 10 MG/ML IV BOLUS
INTRAVENOUS | Status: DC | PRN
  Administered 2014-11-05: 200 mg via INTRAVENOUS

## 2014-11-05 MED ORDER — HYDRALAZINE HCL 20 MG/ML IJ SOLN
INTRAMUSCULAR | Status: DC | PRN
  Administered 2014-11-05: 5 mg via INTRAVENOUS
  Administered 2014-11-05 (×2): 2 mg via INTRAVENOUS

## 2014-11-05 MED ORDER — SUGAMMADEX SODIUM 200 MG/2ML IV SOLN
INTRAVENOUS | Status: DC | PRN
Start: 2014-11-05 — End: 2014-11-05
  Administered 2014-11-05: 200 mg via INTRAVENOUS

## 2014-11-05 MED ORDER — SUCCINYLCHOLINE CHLORIDE 20 MG/ML IJ SOLN
INTRAMUSCULAR | Status: DC | PRN
  Administered 2014-11-05: 100 mg via INTRAVENOUS

## 2014-11-05 MED ORDER — ACETAMINOPHEN 10 MG/ML IV SOLN
INTRAVENOUS | Status: AC
  Filled 2014-11-05: qty 100

## 2014-11-05 MED ORDER — CEFOTETAN DISODIUM-DEXTROSE 2-2.08 GM-% IV SOLR
INTRAVENOUS | Status: AC
  Filled 2014-11-05: qty 50

## 2014-11-05 MED ORDER — FENTANYL CITRATE (PF) 100 MCG/2ML IJ SOLN
INTRAMUSCULAR | Status: DC | PRN
  Administered 2014-11-05 (×8): 50 ug via INTRAVENOUS
  Administered 2014-11-05: 100 ug via INTRAVENOUS

## 2014-11-05 MED ORDER — ONDANSETRON HCL 4 MG/2ML IJ SOLN
INTRAMUSCULAR | Status: DC | PRN
  Administered 2014-11-05: 4 mg via INTRAVENOUS

## 2014-11-05 MED ORDER — NEOSTIGMINE METHYLSULFATE 10 MG/10ML IV SOLN
INTRAVENOUS | Status: AC
  Filled 2014-11-05: qty 1

## 2014-11-05 MED ORDER — MIDAZOLAM HCL 2 MG/2ML IJ SOLN
INTRAMUSCULAR | Status: AC
  Filled 2014-11-05: qty 4

## 2014-11-05 MED ORDER — PROMETHAZINE HCL 25 MG/ML IJ SOLN
6.2500 mg | INTRAMUSCULAR | Status: AC | PRN
  Administered 2014-11-05 (×2): 12.5 mg via INTRAVENOUS

## 2014-11-05 MED ORDER — PROPOFOL 10 MG/ML IV BOLUS
INTRAVENOUS | Status: AC
  Filled 2014-11-05: qty 20

## 2014-11-05 MED ORDER — POTASSIUM CHLORIDE IN NACL 20-0.9 MEQ/L-% IV SOLN
INTRAVENOUS | Status: DC
  Administered 2014-11-05 – 2014-11-07 (×5): via INTRAVENOUS
  Filled 2014-11-05 (×7): qty 1000

## 2014-11-05 MED ORDER — HYDROMORPHONE HCL 1 MG/ML IJ SOLN
1.0000 mg | INTRAMUSCULAR | Status: DC | PRN
  Administered 2014-11-05 (×2): 1 mg via INTRAVENOUS
  Administered 2014-11-06 – 2014-11-08 (×12): 2 mg via INTRAVENOUS
  Filled 2014-11-05 (×6): qty 2
  Filled 2014-11-05: qty 1
  Filled 2014-11-05 (×2): qty 2
  Filled 2014-11-05: qty 1
  Filled 2014-11-05 (×4): qty 2

## 2014-11-05 MED ORDER — HYDRALAZINE HCL 20 MG/ML IJ SOLN
INTRAMUSCULAR | Status: AC
  Filled 2014-11-05: qty 1

## 2014-11-05 MED ORDER — HYDROMORPHONE HCL 1 MG/ML IJ SOLN
INTRAMUSCULAR | Status: DC | PRN
  Administered 2014-11-05: 0.5 mg via INTRAVENOUS
  Administered 2014-11-05: 1 mg via INTRAVENOUS
  Administered 2014-11-05: 0.5 mg via INTRAVENOUS

## 2014-11-05 MED ORDER — LACTATED RINGERS IV SOLN
INTRAVENOUS | Status: DC | PRN
  Administered 2014-11-05 (×3): via INTRAVENOUS

## 2014-11-05 MED ORDER — LABETALOL HCL 5 MG/ML IV SOLN
INTRAVENOUS | Status: DC | PRN
  Administered 2014-11-05 (×4): 5 mg via INTRAVENOUS

## 2014-11-05 MED ORDER — MIDAZOLAM HCL 5 MG/5ML IJ SOLN
INTRAMUSCULAR | Status: DC | PRN
  Administered 2014-11-05 (×2): 1 mg via INTRAVENOUS

## 2014-11-05 MED ORDER — SODIUM CHLORIDE 0.9 % IV SOLN
3.0000 g | INTRAVENOUS | Status: DC | PRN
  Administered 2014-11-05: 3 g via INTRAVENOUS

## 2014-11-05 MED ORDER — LABETALOL HCL 5 MG/ML IV SOLN
INTRAVENOUS | Status: AC
  Filled 2014-11-05: qty 4

## 2014-11-05 MED ORDER — FENTANYL CITRATE (PF) 100 MCG/2ML IJ SOLN
25.0000 ug | INTRAMUSCULAR | Status: DC | PRN
  Administered 2014-11-05 (×3): 50 ug via INTRAVENOUS

## 2014-11-05 MED ORDER — HYDROMORPHONE HCL 1 MG/ML IJ SOLN
INTRAMUSCULAR | Status: AC
  Filled 2014-11-05: qty 1

## 2014-11-05 MED ORDER — SUGAMMADEX SODIUM 200 MG/2ML IV SOLN
INTRAVENOUS | Status: AC
  Filled 2014-11-05: qty 2

## 2014-11-05 MED ORDER — ROCURONIUM BROMIDE 100 MG/10ML IV SOLN
INTRAVENOUS | Status: AC
  Filled 2014-11-05: qty 1

## 2014-11-05 MED ORDER — HYDROMORPHONE HCL 1 MG/ML IJ SOLN
0.2500 mg | INTRAMUSCULAR | Status: DC | PRN
  Administered 2014-11-05 (×4): 0.5 mg via INTRAVENOUS

## 2014-11-05 MED ORDER — OXYCODONE HCL 5 MG PO TABS
10.0000 mg | ORAL_TABLET | ORAL | Status: DC | PRN
  Administered 2014-11-06 – 2014-11-08 (×6): 10 mg via ORAL
  Filled 2014-11-05 (×7): qty 2

## 2014-11-05 MED ORDER — BUPIVACAINE LIPOSOME 1.3 % IJ SUSP
20.0000 mL | Freq: Once | INTRAMUSCULAR | Status: AC
  Administered 2014-11-05: 20 mL
  Filled 2014-11-05: qty 20

## 2014-11-05 MED ORDER — SODIUM CHLORIDE 0.9 % IJ SOLN
INTRAMUSCULAR | Status: DC | PRN
  Administered 2014-11-05: 10 mL

## 2014-11-05 MED ORDER — ROCURONIUM BROMIDE 100 MG/10ML IV SOLN
INTRAVENOUS | Status: DC | PRN
  Administered 2014-11-05: 20 mg via INTRAVENOUS
  Administered 2014-11-05 (×3): 10 mg via INTRAVENOUS
  Administered 2014-11-05: 50 mg via INTRAVENOUS
  Administered 2014-11-05: 20 mg via INTRAVENOUS

## 2014-11-05 MED ORDER — GLYCOPYRROLATE 0.2 MG/ML IJ SOLN
INTRAMUSCULAR | Status: AC
  Filled 2014-11-05: qty 3

## 2014-11-05 MED ORDER — ONDANSETRON HCL 4 MG/2ML IJ SOLN
INTRAMUSCULAR | Status: AC
  Filled 2014-11-05: qty 2

## 2014-11-05 MED ORDER — LACTATED RINGERS IR SOLN
Status: DC | PRN
  Administered 2014-11-05: 1

## 2014-11-05 MED ORDER — 0.9 % SODIUM CHLORIDE (POUR BTL) OPTIME
TOPICAL | Status: DC | PRN
  Administered 2014-11-05: 2000 mL

## 2014-11-05 MED ORDER — ACETAMINOPHEN 10 MG/ML IV SOLN
1000.0000 mg | Freq: Once | INTRAVENOUS | Status: AC
  Administered 2014-11-05: 1000 mg via INTRAVENOUS

## 2014-11-05 MED ORDER — METOPROLOL TARTRATE 1 MG/ML IV SOLN: 5.0000 mg | Freq: Four times a day (QID) | INTRAVENOUS | Status: DC | PRN

## 2014-11-05 MED ORDER — PROMETHAZINE HCL 25 MG/ML IJ SOLN
INTRAMUSCULAR | Status: AC
  Filled 2014-11-05: qty 1

## 2014-11-05 MED ORDER — HYDROMORPHONE HCL 2 MG/ML IJ SOLN
INTRAMUSCULAR | Status: AC
  Filled 2014-11-05: qty 1

## 2014-11-05 MED ORDER — HYDRALAZINE HCL 20 MG/ML IJ SOLN: 20.0000 mg | INTRAMUSCULAR | Status: DC | PRN

## 2014-11-05 MED ORDER — LACTATED RINGERS IV SOLN
INTRAVENOUS | Status: DC | PRN
  Administered 2014-11-05: 13:00:00 via INTRAVENOUS

## 2014-11-05 SURGICAL SUPPLY — 75 items
APPLIER CLIP 5 13 M/L LIGAMAX5 (MISCELLANEOUS)
APPLIER CLIP ROT 10 11.4 M/L (STAPLE)
APR CLP MED LRG 5 ANG JAW (MISCELLANEOUS)
BLADE HEX COATED 2.75 (ELECTRODE) ×3 IMPLANT
CELLS DAT CNTRL 66122 CELL SVR (MISCELLANEOUS) ×1 IMPLANT
CHLORAPREP W/TINT 26ML (MISCELLANEOUS) ×3 IMPLANT
CLIP APPLIE 5 13 M/L LIGAMAX5 (MISCELLANEOUS) IMPLANT
CLIP APPLIE ROT 10 11.4 M/L (STAPLE) IMPLANT
COVER SURGICAL LIGHT HANDLE (MISCELLANEOUS) ×6 IMPLANT
CUTTER FLEX LINEAR 45M (STAPLE) ×3 IMPLANT
DRAPE CAMERA CLOSED 9X96 (DRAPES) ×3 IMPLANT
DRAPE LAPAROSCOPIC ABDOMINAL (DRAPES) ×3 IMPLANT
DRSG OPSITE POSTOP 4X10 (GAUZE/BANDAGES/DRESSINGS) IMPLANT
DRSG OPSITE POSTOP 4X6 (GAUZE/BANDAGES/DRESSINGS) ×3 IMPLANT
DRSG OPSITE POSTOP 4X8 (GAUZE/BANDAGES/DRESSINGS) IMPLANT
ELECT REM PT RETURN 9FT ADLT (ELECTROSURGICAL) ×3
ELECTRODE REM PT RTRN 9FT ADLT (ELECTROSURGICAL) ×1 IMPLANT
ENDOLOOP SUT PDS II  0 18 (SUTURE)
ENDOLOOP SUT PDS II 0 18 (SUTURE) IMPLANT
GAUZE SPONGE 4X4 12PLY STRL (GAUZE/BANDAGES/DRESSINGS) ×3 IMPLANT
GLOVE BIOGEL PI IND STRL 7.5 (GLOVE) ×2 IMPLANT
GLOVE BIOGEL PI INDICATOR 7.5 (GLOVE) ×4
GLOVE ECLIPSE 7.5 STRL STRAW (GLOVE) ×6 IMPLANT
GOWN STRL REUS W/TWL LRG LVL3 (GOWN DISPOSABLE) ×3 IMPLANT
GOWN STRL REUS W/TWL XL LVL3 (GOWN DISPOSABLE) ×6 IMPLANT
LEGGING LITHOTOMY PAIR STRL (DRAPES) ×3 IMPLANT
LIGASURE IMPACT 36 18CM CVD LR (INSTRUMENTS) IMPLANT
LIQUID BAND (GAUZE/BANDAGES/DRESSINGS) ×3 IMPLANT
NS IRRIG 1000ML POUR BTL (IV SOLUTION) ×6 IMPLANT
PACK COLON (CUSTOM PROCEDURE TRAY) ×3 IMPLANT
PEN SKIN MARKING BROAD (MISCELLANEOUS) ×3 IMPLANT
PORT LAP GEL ALEXIS MED 5-9CM (MISCELLANEOUS) ×3 IMPLANT
RELOAD 45 VASCULAR/THIN (ENDOMECHANICALS) ×3 IMPLANT
RELOAD STAPLE TA45 3.5 REG BLU (ENDOMECHANICALS) IMPLANT
RELOAD STAPLER BLUE 60MM (STAPLE) ×1 IMPLANT
RTRCTR WOUND ALEXIS 18CM MED (MISCELLANEOUS) ×3
SCISSORS LAP 5X35 DISP (ENDOMECHANICALS) ×3 IMPLANT
SET IRRIG TUBING LAPAROSCOPIC (IRRIGATION / IRRIGATOR) IMPLANT
SHEARS HARMONIC ACE PLUS 36CM (ENDOMECHANICALS) IMPLANT
SLEEVE ADV FIXATION 12X100MM (TROCAR) IMPLANT
SLEEVE ADV FIXATION 5X100MM (TROCAR) IMPLANT
SLEEVE XCEL OPT CAN 5 100 (ENDOMECHANICALS) ×6 IMPLANT
SOLUTION ANTI FOG 6CC (MISCELLANEOUS) ×3 IMPLANT
SPONGE LAP 18X18 X RAY DECT (DISPOSABLE) ×3 IMPLANT
STAPLE ECHEON FLEX 60 POW ENDO (STAPLE) ×3 IMPLANT
STAPLER CUT CVD 40MM BLUE (STAPLE) ×3 IMPLANT
STAPLER RELOAD BLUE 60MM (STAPLE) ×3
STAPLER VISISTAT 35W (STAPLE) ×3 IMPLANT
SUCTION POOLE TIP (SUCTIONS) ×3 IMPLANT
SUT MNCRL AB 4-0 PS2 18 (SUTURE) IMPLANT
SUT PDS AB 0 CT1 36 (SUTURE) ×12 IMPLANT
SUT PDS AB 1 CT1 27 (SUTURE) IMPLANT
SUT PDS AB 1 CTX 36 (SUTURE) IMPLANT
SUT PDS AB 1 TP1 96 (SUTURE) IMPLANT
SUT PROLENE 2 0 KS (SUTURE) ×3 IMPLANT
SUT SILK 2 0 (SUTURE) ×3
SUT SILK 2 0 SH CR/8 (SUTURE) ×3 IMPLANT
SUT SILK 2-0 18XBRD TIE 12 (SUTURE) ×1 IMPLANT
SUT SILK 3 0 (SUTURE) ×3
SUT SILK 3 0 SH CR/8 (SUTURE) ×3 IMPLANT
SUT SILK 3-0 18XBRD TIE 12 (SUTURE) ×1 IMPLANT
SYS LAPSCP GELPORT 120MM (MISCELLANEOUS)
SYSTEM LAPSCP GELPORT 120MM (MISCELLANEOUS) IMPLANT
TOWEL OR 17X26 10 PK STRL BLUE (TOWEL DISPOSABLE) ×3 IMPLANT
TRAY FOLEY W/METER SILVER 14FR (SET/KITS/TRAYS/PACK) ×3 IMPLANT
TRAY FOLEY W/METER SILVER 16FR (SET/KITS/TRAYS/PACK) ×3 IMPLANT
TROCAR BLADELESS OPT 5 100 (ENDOMECHANICALS) ×3 IMPLANT
TROCAR UNIVERSAL OPT 12M 100M (ENDOMECHANICALS) IMPLANT
TROCAR XCEL 12X100 BLDLESS (ENDOMECHANICALS) ×3 IMPLANT
TROCAR XCEL BLUNT TIP 100MML (ENDOMECHANICALS) IMPLANT
TROCAR XCEL NON-BLD 11X100MML (ENDOMECHANICALS) IMPLANT
TROCAR XCEL UNIV SLVE 11M 100M (ENDOMECHANICALS) IMPLANT
TUBING FILTER THERMOFLATOR (ELECTROSURGICAL) ×3 IMPLANT
TUBING INSUFFLATION 10FT LAP (TUBING) ×3 IMPLANT
YANKAUER SUCT BULB TIP NO VENT (SUCTIONS) ×3 IMPLANT

## 2014-11-05 NOTE — Op Note (Signed)
Preoperative Diagnosis: Recurrent diverticulitis sigmoid colon  Postoprative Diagnosis: Same  Procedure: Procedure(s): LAPAROSCOPIC SIGMOID COLECTOMY   Surgeon: [name] T   Assistants: Feliciana Rossetti  Anesthesia:  General endotracheal anesthesia  Indications: Patient is a 39 year old male with recurrent episodes of sigmoid diverticulitis documented by CT scan and colonoscopy. After extensive preoperative discussion regarding indications and risks detailed elsewhere we have elected to proceed with laparoscopic sigmoid colectomy for treatment of his recurrent diverticulitis.   Procedure Detail:  The patient had undergone a mechanical and antibiotic bowel prep the night before. He received preoperative IV antibiotics. He was brought to the operating room, placed in supine position on the operating table, and general endotracheal anesthesia induced. Foley catheter was placed. PA aspirin place. He was carefully positioned and padded lithotomy position and secured to the table. The abdomen and perineum were widely sterilely prepped and draped. Patient timeout was performed and correct procedure verified. Access was obtained 5 mm Optiview trocar in the right upper quadrant without difficulty and pneumoperitoneum established. There was no evidence of trocar injury. Under direct vision a 5 mm trocar was placed at the umbilicus for the camera, a 12 mm trocar in the right lower quadrant, 5 mm trocar in the left lower quadrant and a 5 mm trocar in the left upper quadrant. The abdomen was carefully explored. The sigmoid colon was noted to be thickened and somewhat chronically inflamed at its midpoint with fairly extensive chronic adhesions up to the lateral pelvis. The rectosigmoid and left colon appeared normal. Small bowel loops were normal. The patient was placed in steep reverse Trendelenburg and rotated to the right. The base of the sigmoid mesocolon was exposed medially and the peritoneum incised  just above the sacrum and the retroperitoneum. Blunt dissection was carried into the plane between the retroperitoneum and the mesial colon. The mesocolon was very thick and fatty and I initially had difficulty identifying the left ureter. Therefore I approached the dissection laterally dividing chronic inflammatory adhesions and then the normal peritoneal attachments laterally to the sigmoid colon and left colon and dissection was carried back into the retroperitoneum. From this angle of the ureter was easily identified and traced along its course and exposed. Going back medially we then were able to quickly identify the correct plane and see the ureter from the medial dissection and make sure it was dissected well posteriorly. With the ureter in view of the inferior mesenteric vascular pedicle was isolated and divided with a single firing of the vascular load 45 mm stapler. The mesentery of the more distal sigmoid was divided working distally with Harmonic scalpel. We identified our point of distal resection where the tinea had disappeared in an area of soft rectosigmoid a few centimeters above the peritoneal reflection. Peritoneum on either side of this was incised and the mesentery was dissected and with careful blunt dissection a plane developed posteriorly to the rectosigmoid and a window created. The rectosigmoid was then divided with a single firing of the 60 mm blue load echelon stapler. The remaining mesentery of the more proximal rectosigmoid was then sequentially divided with the Harmonic scalpel until we reached our mesenteric dissection more proximally and the colon was completely freed. An extraction incision was then created in the left lower quadrant incorporating the 5 mm trocar site making approximately 5 cm incision. A muscle-splitting incision dividing the external oblique, bluntly splitting the internal oblique and dividing the peritoneum was used and the [employer] wound protector placed. The end  of the rectosigmoid  colon was brought out through the incision and we could easily exteriorize a long specimen of sigmoid colon up to soft normal-appearing distal left colon which reached easily down toward the pubis. This area was cleared of mesentery. The pursestring clamp was used to 2-0 Prolene pursestring suture placed and the bowel was divided and specimen removed. The end of the colon was sized to 29 mm and a 29 mm anvil was placed and the pursestring suture secured. The end of the colon was dropped back into the abdomen and abdomen reinsufflated. Dr. Sheliah Hatch went below and was able to easily pass the 29 mm EEA stapler up to the staple line of the rectosigmoid and the spike was deployed through the center of the staple line. The anvil was connected and the stapler closed excluding all extraneous tissue and fired. The stapler was easily removed. Both donuts were thick and intact. There was no tension. With the proximal bowel clamped under saline irrigation Dr. Sheliah Hatch tensely distended the rectosigmoid and there was no evidence of leak. This was then desufflated. The abdomen was irrigated and inspected and there was no bleeding or other signs of problems. At this point all gloves gowns and instruments and drapes were changed. Exparel anesthetic was used to infiltrate the muscle and fascial layers and left lower quadrant incision. This incision was closed in layers with running 0 PDS. Wounds were irrigated and skin closed with subcuticular Monocryl and Dermabond. Sponge needle and estimate counts were correct.    Findings: As above  Estimated Blood Loss:  less than 100 mL         Drains: none  Blood Given: none          Specimens: Sigmoid colon        Complications:  * No complications entered in OR log *         Disposition: PACU - hemodynamically stable.         Condition: stable

## 2014-11-05 NOTE — Transfer of Care (Signed)
Immediate Anesthesia Transfer of Care Note  Patient: Barry Parsons  Procedure(s) Performed: Procedure(s): LAPAROSCOPIC SIGMOID COLECTOMY (N/A)  Patient Location: PACU  Anesthesia Type:General  Level of Consciousness:  sedated, patient cooperative and responds to stimulation  Airway & Oxygen Therapy:Patient Spontanous Breathing and Patient connected to face mask oxgen  Post-op Assessment:  Report given to PACU RN and Post -op Vital signs reviewed and stable  Post vital signs:  Reviewed and stable  Last Vitals:  Filed Vitals:   11/05/14 1046  BP: 103/54  Pulse: 66  Temp: 37 C  Resp: 17    Complications: No apparent anesthesia complications

## 2014-11-05 NOTE — Care Management Note (Signed)
Case Management Note  Patient Details  Name: Barry Parsons MRN: [id number] Date of Birth: [birth year]  Subjective/Objective:               abd pain with hx of recent gi surg     Action/Plan:Date:  Oct. 10, 2016 U.R. performed for needs and level of care. Will continue to follow for Case Management needs.  Marcelle Smiling, RN, BSN, [place]   [phone]    Expected Discharge Date:                  Expected Discharge Plan:  Home/Self Care  In-House Referral:  NA  Discharge planning Services  CM Consult  Post Acute Care Choice:  NA Choice offered to:  NA  DME Arranged:    DME Agency:     HH Arranged:    HH Agency:     Status of Service:  In process, will continue to follow  Medicare Important Message Given:    Date Medicare IM Given:    Medicare IM give by:    Date Additional Medicare IM Given:    Additional Medicare Important Message give by:     If discussed at Long Length of Stay Meetings, dates discussed:    Additional Comments:  Golda Acre, RN 11/05/2014, 11:48 AM

## 2014-11-05 NOTE — Anesthesia Procedure Notes (Addendum)
Procedure Name: Intubation Date/Time: 11/05/2014 1:19 PM Performed by: Ludwig Lean Pre-anesthesia Checklist: Patient identified, Emergency Drugs available, Suction available and Patient being monitored Patient Re-evaluated:Patient Re-evaluated prior to inductionOxygen Delivery Method: Circle System Utilized Preoxygenation: Pre-oxygenation with 100% oxygen Intubation Type: IV induction Ventilation: Mask ventilation without difficulty Laryngoscope Size: Mac and 4 Grade View: Grade II Tube type: Oral Tube size: 8.0 mm Number of attempts: 2 Placement Confirmation: ETT inserted through vocal cords under direct vision,  positive ETCO2 and breath sounds checked- equal and bilateral Secured at: 23 cm Tube secured with: Tape Dental Injury: Teeth and Oropharynx as per pre-operative assessment  Comments: Patient with recessed chin once he laid down. Required 2 people to adequately form seal to mask. DVL x 1 with Miller 3 blade, no view, DVL x 1 with mac 4, grade 2 view with anterior pressure.

## 2014-11-05 NOTE — Anesthesia Postprocedure Evaluation (Signed)
  Anesthesia Post-op Note  Patient: Barry Parsons  Procedure(s) Performed: Procedure(s): LAPAROSCOPIC SIGMOID COLECTOMY (N/A)  Patient Location: PACU  Anesthesia Type:General  Parsons of Consciousness: awake and alert   Airway and Oxygen Therapy: Patient Spontanous Breathing  Post-op Pain: Controlled  Post-op Assessment: Post-op Vital signs reviewed, Patient's Cardiovascular Status Stable and Respiratory Function Stable  Post-op Vital Signs: Reviewed  Filed Vitals:   11/05/14 1957  BP: 120/68  Pulse: 84  Temp: 36.8 C  Resp: 12    Complications: No apparent anesthesia complications

## 2014-11-05 NOTE — Progress Notes (Signed)
PHARMACY NOTE -  Unasyn   Pharmacy has been assisting with dosing of UNASYN for INTRA-ABDOMINAL INFECTION.  Dosage remains stable at 3 g q6 hr and need for further dosage adjustment appears unlikely at present.  Will sign off at this time.  Please reconsult if a change in clinical status warrants re-evaluation of dosage.  Bernadene Person, PharmD, BCPS Pager: [phone] 11/05/2014, 10:29 AM

## 2014-11-06 ENCOUNTER — Encounter (HOSPITAL_COMMUNITY): Payer: Self-pay | Admitting: General Surgery

## 2014-11-06 LAB — CBC
HEMATOCRIT: 40.9 % (ref 39.0–52.0)
Hemoglobin: 14.4 g/dL (ref 13.0–17.0)
MCH: 32 pg (ref 26.0–34.0)
MCHC: 35.2 g/dL (ref 30.0–36.0)
MCV: 90.9 fL (ref 78.0–100.0)
Platelets: 215 10*3/uL (ref 150–400)
RBC: 4.5 MIL/uL (ref 4.22–5.81)
RDW: 12.2 % (ref 11.5–15.5)
WBC: 8.4 10*3/uL (ref 4.0–10.5)

## 2014-11-06 MED ORDER — DIPHENHYDRAMINE HCL 50 MG/ML IJ SOLN
12.5000 mg | Freq: Four times a day (QID) | INTRAMUSCULAR | Status: DC | PRN
Start: 2014-11-06 — End: 2014-11-06

## 2014-11-06 MED ORDER — DIPHENHYDRAMINE HCL 50 MG/ML IJ SOLN
12.5000 mg | Freq: Four times a day (QID) | INTRAMUSCULAR | Status: DC | PRN
Start: 2014-11-06 — End: 2014-11-08
  Administered 2014-11-07 (×2): 12.5 mg via INTRAVENOUS
  Administered 2014-11-07 – 2014-11-08 (×2): 25 mg via INTRAVENOUS
  Filled 2014-11-06 (×5): qty 1

## 2014-11-06 MED ORDER — ENOXAPARIN SODIUM 60 MG/0.6ML ~~LOC~~ SOLN
0.5000 mg/kg | Freq: Every day | SUBCUTANEOUS | Status: DC
  Administered 2014-11-06 – 2014-11-07 (×2): 55 mg via SUBCUTANEOUS
  Filled 2014-11-06 (×3): qty 0.6

## 2014-11-06 NOTE — Progress Notes (Signed)
10102016/colecectomy on 10102016/npo,ivlfds/pain meds/Elayne Gruver,RN,BSN,CCM

## 2014-11-07 LAB — CBC
HCT: 39.2 % (ref 39.0–52.0)
HEMOGLOBIN: 13.5 g/dL (ref 13.0–17.0)
MCH: 31.6 pg (ref 26.0–34.0)
MCHC: 34.4 g/dL (ref 30.0–36.0)
MCV: 91.8 fL (ref 78.0–100.0)
Platelets: 216 10*3/uL (ref 150–400)
RBC: 4.27 MIL/uL (ref 4.22–5.81)
RDW: 12.5 % (ref 11.5–15.5)
WBC: 7.4 10*3/uL (ref 4.0–10.5)

## 2014-11-07 NOTE — Progress Notes (Signed)
Patient ID: Barry Parsons, male   DOB: [birth year], 39 y.o.   MRN: [id number] 2 Days Post-Op  Subjective: Doing well.  Mild pain relieved with meds.  Hungry. No flatus yet  Objective: Vital signs in last 24 hours: Temp:  [98.1 F (36.7 C)-98.3 F (36.8 C)] 98.1 F (36.7 C) (10/12 0528) Pulse Rate:  [75-84] 75 (10/12 0528) Resp:  [18] 18 (10/12 0528) BP: (105-143)/(69-80) 105/69 mmHg (10/12 0528) SpO2:  [98 %-99 %] 99 % (10/12 0528) Weight:  [112.492 kg (248 lb)] 112.492 kg (248 lb) (10/11 1300) Last BM Date: 11/05/14  Intake/Output from previous day: 10/11 0701 - 10/12 0700 In: 2445 [P.O.:210; I.V.:2235] Out: 1125 [Urine:1125] Intake/Output this shift:    General appearance: alert, cooperative and no distress GI: normal findings: soft, non-tender Incision/Wound: Clean and dry  Lab Results:   Recent Labs  11/06/14 1051 11/07/14 0515  WBC 8.4 7.4  HGB 14.4 13.5  HCT 40.9 39.2  PLT 215 216   BMET  Recent Labs  11/05/14 0525  NA 139  K 3.6  CL 107  CO2 24  GLUCOSE 92  BUN 7  CREATININE 0.72  CALCIUM 9.0     Studies/Results: No results found.  Anti-infectives: Anti-infectives    Start     Dose/Rate Route Frequency Ordered Stop   11/05/14 0200  neomycin (MYCIFRADIN) tablet 1,000 mg     1,000 mg Oral  Once 11/04/14 1700 11/04/14 1744   11/05/14 0200  erythromycin (E-MYCIN) tablet 1,000 mg     1,000 mg Oral  Once 11/04/14 1700 11/04/14 1743   11/04/14 2300  neomycin (MYCIFRADIN) tablet 1,000 mg     1,000 mg Oral  Once 11/04/14 2257 11/04/14 2356   11/04/14 2300  erythromycin (E-MYCIN) tablet 1,000 mg     1,000 mg Oral  Once 11/04/14 2257 11/04/14 2356   11/04/14 1400  neomycin (MYCIFRADIN) tablet 1,000 mg     1,000 mg Oral 3 times per day 11/04/14 0751 11/04/14 1746   11/04/14 1400  erythromycin (E-MYCIN) tablet 1,000 mg     1,000 mg Oral 3 times per day 11/04/14 0751 11/04/14 1746   11/02/14 1800  Ampicillin-Sulbactam (UNASYN) 3 g in sodium chloride  0.9 % 100 mL IVPB  Status:  Discontinued     3 g 100 mL/hr over 60 Minutes Intravenous Every 6 hours 11/02/14 1751 11/05/14 1813      Assessment/Plan: s/p Procedure(s): LAPAROSCOPIC SIGMOID COLECTOMY Doing very well.  [facility]  Prob discharge tomorrow   LOS: 5 days    Lakyra Tippins T 11/07/2014

## 2014-11-08 MED ORDER — OXYCODONE HCL 10 MG PO TABS: 10.0000 mg | ORAL_TABLET | ORAL | Status: DC | PRN

## 2014-11-08 NOTE — Discharge Instructions (Signed)
[facility]      [facility] [phone]  OPEN ABDOMINAL SURGERY: POST OP INSTRUCTIONS  Always review your discharge instruction sheet given to you by the facility where your surgery was performed.  IF YOU HAVE DISABILITY OR FAMILY LEAVE FORMS, YOU MUST BRING THEM TO THE OFFICE FOR PROCESSING.  PLEASE DO NOT GIVE THEM TO YOUR DOCTOR.  1. A prescription for pain medication may be given to you upon discharge.  Take your pain medication as prescribed, if needed.  If narcotic pain medicine is not needed, then you may take acetaminophen (Tylenol) or ibuprofen (Advil) as needed. 2. Take your usually prescribed medications unless otherwise directed. 3. If you need a refill on your pain medication, please contact your pharmacy. They will contact our office to request authorization.  Prescriptions will not be filled after 5pm or on week-ends. 4. You should follow a light diet the first few days after arrival home, such as soup and crackers, pudding, etc.unless your doctor has advised otherwise. A high-fiber, low fat diet can be resumed as tolerated.   Be sure to include lots of fluids daily. Most patients will experience some swelling and bruising on the chest and neck area.  Ice packs will help.  Swelling and bruising can take several days to resolve 5. Most patients will experience some swelling and bruising in the area of the incision. Ice pack will help. Swelling and bruising can take several days to resolve..  6. It is common to experience some constipation if taking pain medication after surgery.  Increasing fluid intake and taking a stool softener will usually help or prevent this problem from occurring.  A mild laxative (Milk of Magnesia or Miralax) should be taken according to package directions if there are no bowel movements after 48 hours. 7.  You may have steri-strips (small skin tapes) in place directly over the incision.  These strips should be left on the skin for 7-10 days.  If your  surgeon used skin glue on the incision, you may shower in 24 hours.  The glue will flake off over the next 2-3 weeks.  Any sutures or staples will be removed at the office during your follow-up visit. You may find that a light gauze bandage over your incision may keep your staples from being rubbed or pulled. You may shower and replace the bandage daily. 8. ACTIVITIES:  You may resume regular (light) daily activities beginning the next day--such as daily self-care, walking, climbing stairs--gradually increasing activities as tolerated.  You may have sexual intercourse when it is comfortable.  Refrain from any heavy lifting or straining until approved by your doctor. a. You may drive when you no longer are taking prescription pain medication, you can comfortably wear a seatbelt, and you can safely maneuver your car and apply brakes b. Return to Work: ___________________________________ 9. You should see your doctor in the office for a follow-up appointment approximately two weeks after your surgery.  Make sure that you call for this appointment within a day or two after you arrive home to insure a convenient appointment time. OTHER INSTRUCTIONS:  _____________________________________________________________ _____________________________________________________________  WHEN TO CALL YOUR DOCTOR: 1. Fever over 101.0 2. Inability to urinate 3. Nausea and/or vomiting 4. Extreme swelling or bruising 5. Continued bleeding from incision. 6. Increased pain, redness, or drainage from the incision. 7. Difficulty swallowing or breathing 8. Muscle cramping or spasms. 9. Numbness or tingling in hands or feet or around lips.  The clinic staff is available to   answer your questions during regular business hours.  Please don't hesitate to call and ask to speak to one of the nurses if you have concerns.  For further questions, please visit [email or web address]   

## 2014-11-08 NOTE — Discharge Summary (Signed)
   Patient ID: Barry Parsons [id number] 39 y.o. 1975-03-16  11/02/2014  Discharge date and time: 11/08/2014   Admitting Physician: Glenna FellowsHOXWORTH,Jurgen Groeneveld T  Discharge Physician: Glenna FellowsHOXWORTH,Telesforo Brosnahan T  Admission Diagnoses: Left lower quadrant pain, recurrent diverticulitis  Discharge Diagnoses: Same  Operations: Procedure(s): LAPAROSCOPIC SIGMOID COLECTOMY  Admission Condition: fair  Discharged Condition: good  Indication for Admission: patient has a history over at least a year of recurrent episodes of acute left lower quadrant abdominal painand documented sigmoid colon diverticulitis by CT scan and by colonoscopy. After extensive discussion detailed elsewhere he was scheduled for elective laparoscopic sigmoid colectomy.    Hospital Course: 2 days prior to surgery again developed acute left lower quadrant pain and was admitted to the hospitalist service for treatment with antibiotics IV fluids and pain medication. He improved and we elected to continue with planned surgery. He was transferred to my service on the day of surgery and underwent an uneventful laparoscopic sigmoid colectomy. His postoperative course was unremarkable. He had expected incisional pain but remained very stable with normal CBC and benign abdomen. Started on clear liquids on the first postoperative day which he tolerated well and advanced to full liquids the second postoperative day. On the third postoperative day he has some incisional pain related to motion but is feeling well. Advance to a soft diet. Abdomen is soft and nontender. Wounds are clean without evidence of infection. He is felt ready for discharge.   Disposition: Home  Patient Instructions:    Medication List    STOP taking these medications        amoxicillin-clavulanate 875-125 MG tablet  Commonly known as:  AUGMENTIN     metroNIDAZOLE 500 MG tablet  Commonly known as:  FLAGYL      TAKE these medications        ondansetron 8 MG  disintegrating tablet  Commonly known as:  ZOFRAN ODT  Take 1 tablet (8 mg total) by mouth every 8 (eight) hours as needed for nausea or vomiting.     Oxycodone HCl 10 MG Tabs  Take 1 tablet (10 mg total) by mouth every 4 (four) hours as needed for moderate pain.     promethazine 25 MG suppository  Commonly known as:  PHENERGAN  Place 1 suppository (25 mg total) rectally every 6 (six) hours as needed for nausea or vomiting.     traMADol 50 MG tablet  Commonly known as:  ULTRAM  Take 50 mg by mouth every 8 (eight) hours as needed for moderate pain or severe pain (pain).        Activity: no heavy lifting for 4 weeks Diet: regular diet Wound Care: none needed  Follow-up:  With Dr. Johna SheriffHoxworth in 3 weeks.  Signed: Mariella SaaBenjamin T Tysha Grismore MD, FACS  11/08/2014, 6:54 AM

## 2014-11-08 NOTE — Progress Notes (Signed)
Patient ID: Barry LevelKevin Parsons, male   DOB: Jun 06, 1975, 39 y.o.   MRN: [id number] 3 Days Post-Op  Subjective: Still some pain directly at his incision related to motion. Otherwise feels well. Tolerating full liquids without nausea. His felt rumbling in his abdomen but no flatus or bowel movements yet.  Objective: Vital signs in last 24 hours: Temp:  [98.1 F (36.7 C)-99 F (37.2 C)] 99 F (37.2 C) (10/13 0500) Pulse Rate:  [60-71] 67 (10/13 0500) Resp:  [18] 18 (10/13 0500) BP: (109-121)/(60-68) 109/60 mmHg (10/13 0500) SpO2:  [93 %-99 %] 96 % (10/13 0500) Last BM Date: 11/06/14  Intake/Output from previous day: 10/12 0701 - 10/13 0700 In: 1443.3 [I.V.:1443.3] Out: -  Intake/Output this shift: Total I/O In: 1443.3 [I.V.:1443.3] Out: -   General appearance: alert, cooperative and no distress GI: normal findings: nondistended  Incision/Wound: clean and dry without erythema or drainage  Lab Results:   Recent Labs  11/06/14 1051 11/07/14 0515  WBC 8.4 7.4  HGB 14.4 13.5  HCT 40.9 39.2  PLT 215 216   BMET No results for input(s): NA, K, CL, CO2, GLUCOSE, BUN, CREATININE, CALCIUM in the last 72 hours.   Studies/Results: No results found.  Anti-infectives: Anti-infectives    Start     Dose/Rate Route Frequency Ordered Stop   11/05/14 0200  neomycin (MYCIFRADIN) tablet 1,000 mg     1,000 mg Oral  Once 11/04/14 1700 11/04/14 1744   11/05/14 0200  erythromycin (E-MYCIN) tablet 1,000 mg     1,000 mg Oral  Once 11/04/14 1700 11/04/14 1743   11/04/14 2300  neomycin (MYCIFRADIN) tablet 1,000 mg     1,000 mg Oral  Once 11/04/14 2257 11/04/14 2356   11/04/14 2300  erythromycin (E-MYCIN) tablet 1,000 mg     1,000 mg Oral  Once 11/04/14 2257 11/04/14 2356   11/04/14 1400  neomycin (MYCIFRADIN) tablet 1,000 mg     1,000 mg Oral 3 times per day 11/04/14 0751 11/04/14 1746   11/04/14 1400  erythromycin (E-MYCIN) tablet 1,000 mg     1,000 mg Oral 3 times per day 11/04/14 0751  11/04/14 1746   11/02/14 1800  Ampicillin-Sulbactam (UNASYN) 3 g in sodium chloride 0.9 % 100 mL IVPB  Status:  Discontinued     3 g 100 mL/hr over 60 Minutes Intravenous Every 6 hours 11/02/14 1751 11/05/14 1813      Assessment/Plan: s/p Procedure(s): LAPAROSCOPIC SIGMOID COLECTOMY Doing very well without apparent complication.  Advance to soft diet and anticipate discharge later today.   LOS: 6 days    Barry Parsons 11/08/2014

## 2014-11-28 ENCOUNTER — Emergency Department (HOSPITAL_COMMUNITY): Payer: BLUE CROSS/BLUE SHIELD

## 2014-11-28 ENCOUNTER — Emergency Department (HOSPITAL_COMMUNITY)
Admission: EM | Admit: 2014-11-28 | Discharge: 2014-11-29 | Disposition: A | Discharge status: Home / Self Care | Payer: BLUE CROSS/BLUE SHIELD | Source: Home / Self Care | Attending: Emergency Medicine | Admitting: Emergency Medicine

## 2014-11-28 DIAGNOSIS — K5792 Diverticulitis of intestine, part unspecified, without perforation or abscess without bleeding: Secondary | ICD-10-CM

## 2014-11-28 DIAGNOSIS — R1032 Left lower quadrant pain: Secondary | ICD-10-CM | POA: Diagnosis not present

## 2014-11-28 DIAGNOSIS — G8918 Other acute postprocedural pain: Secondary | ICD-10-CM | POA: Diagnosis not present

## 2014-11-28 LAB — CBC
HCT: 45 % (ref 39.0–52.0)
Hemoglobin: 16.2 g/dL (ref 13.0–17.0)
MCH: 31.9 pg (ref 26.0–34.0)
MCHC: 36 g/dL (ref 30.0–36.0)
MCV: 88.6 fL (ref 78.0–100.0)
Platelets: 279 10*3/uL (ref 150–400)
RBC: 5.08 MIL/uL (ref 4.22–5.81)
RDW: 12.3 % (ref 11.5–15.5)
WBC: 11.2 10*3/uL — ABNORMAL HIGH (ref 4.0–10.5)

## 2014-11-28 LAB — URINALYSIS, ROUTINE W REFLEX MICROSCOPIC
Bilirubin Urine: NEGATIVE
Glucose, UA: NEGATIVE mg/dL
Hgb urine dipstick: NEGATIVE
Ketones, ur: >80 mg/dL — AB
Leukocytes, UA: NEGATIVE
Nitrite: NEGATIVE
Protein, ur: NEGATIVE mg/dL
Specific Gravity, Urine: 1.015 (ref 1.005–1.030)
Urobilinogen, UA: 0.2 mg/dL (ref 0.0–1.0)
pH: 8 (ref 5.0–8.0)

## 2014-11-28 LAB — COMPREHENSIVE METABOLIC PANEL
ALT: 21 U/L (ref 17–63)
AST: 29 U/L (ref 15–41)
Albumin: 5 g/dL (ref 3.5–5.0)
Alkaline Phosphatase: 68 U/L (ref 38–126)
Anion gap: 15 (ref 5–15)
BUN: 10 mg/dL (ref 6–20)
CO2: 18 mmol/L — ABNORMAL LOW (ref 22–32)
Calcium: 10.3 mg/dL (ref 8.9–10.3)
Chloride: 108 mmol/L (ref 101–111)
Creatinine, Ser: 0.81 mg/dL (ref 0.61–1.24)
GFR calc Af Amer: >60 mL/min (ref >60)
GFR calc non Af Amer: >60 mL/min (ref >60)
Glucose, Bld: 141 mg/dL — ABNORMAL HIGH (ref 65–99)
Potassium: 3.5 mmol/L (ref 3.5–5.1)
Sodium: 141 mmol/L (ref 135–145)
Total Bilirubin: 1.3 mg/dL — ABNORMAL HIGH (ref 0.3–1.2)
Total Protein: 8 g/dL (ref 6.5–8.1)

## 2014-11-28 LAB — LIPASE, BLOOD: Lipase: 20 U/L (ref 11–51)

## 2014-11-28 MED ORDER — ONDANSETRON HCL 4 MG/2ML IJ SOLN
4.0000 mg | Freq: Once | INTRAMUSCULAR | Status: AC
  Administered 2014-11-28: 4 mg via INTRAVENOUS

## 2014-11-28 MED ORDER — IOHEXOL 300 MG/ML  SOLN
100.0000 mL | Freq: Once | INTRAMUSCULAR | Status: AC | PRN
  Administered 2014-11-28: 100 mL via INTRAVENOUS

## 2014-11-28 MED ORDER — SODIUM CHLORIDE 0.9 % IV BOLUS (SEPSIS)
1000.0000 mL | Freq: Once | INTRAVENOUS | Status: AC
  Administered 2014-11-28: 1000 mL via INTRAVENOUS

## 2014-11-28 MED ORDER — ONDANSETRON HCL 4 MG/2ML IJ SOLN
4.0000 mg | Freq: Once | INTRAMUSCULAR | Status: AC
  Administered 2014-11-28: 4 mg via INTRAVENOUS
  Filled 2014-11-28: qty 2

## 2014-11-28 MED ORDER — MORPHINE SULFATE (PF) 4 MG/ML IV SOLN
4.0000 mg | Freq: Once | INTRAVENOUS | Status: AC
  Administered 2014-11-28: 4 mg via INTRAVENOUS
  Filled 2014-11-28: qty 1

## 2014-11-28 MED ORDER — HYDROMORPHONE HCL 1 MG/ML IJ SOLN
1.0000 mg | INTRAMUSCULAR | Status: AC | PRN
  Administered 2014-11-28 (×2): 1 mg via INTRAVENOUS
  Filled 2014-11-28 (×2): qty 1

## 2014-11-28 MED ORDER — METOCLOPRAMIDE HCL 5 MG/ML IJ SOLN
10.0000 mg | Freq: Once | INTRAMUSCULAR | Status: AC
  Administered 2014-11-28: 10 mg via INTRAVENOUS
  Filled 2014-11-28: qty 2

## 2014-11-28 MED ORDER — CIPROFLOXACIN IN D5W 400 MG/200ML IV SOLN
400.0000 mg | Freq: Once | INTRAVENOUS | Status: AC
  Administered 2014-11-29: 400 mg via INTRAVENOUS
  Filled 2014-11-28: qty 200

## 2014-11-28 MED ORDER — METRONIDAZOLE IN NACL 5-0.79 MG/ML-% IV SOLN
500.0000 mg | Freq: Once | INTRAVENOUS | Status: AC
  Administered 2014-11-28: 500 mg via INTRAVENOUS
  Filled 2014-11-28: qty 100

## 2014-11-28 MED ORDER — HYDROMORPHONE HCL 1 MG/ML IJ SOLN
1.0000 mg | Freq: Once | INTRAMUSCULAR | Status: AC
  Administered 2014-11-28: 1 mg via INTRAVENOUS
  Filled 2014-11-28: qty 1

## 2014-11-28 NOTE — ED Notes (Signed)
Pt states that he had a foot of his colon removed on 10/10 and now has more pain and N/V today. Mostly LLQ. Took zofran at home w/o relief. Alert and oriented. Actively vomiting in triage.

## 2014-11-29 MED ORDER — HYDROMORPHONE HCL 1 MG/ML IJ SOLN
1.0000 mg | Freq: Once | INTRAMUSCULAR | Status: DC | PRN
  Administered 2014-11-29: 1 mg via INTRAVENOUS
  Filled 2014-11-29: qty 1

## 2014-11-29 MED ORDER — HYDROMORPHONE HCL 1 MG/ML IJ SOLN
1.0000 mg | Freq: Once | INTRAMUSCULAR | Status: AC
  Administered 2014-11-29: 1 mg via INTRAVENOUS
  Filled 2014-11-29: qty 1

## 2014-11-29 MED ORDER — OXYCODONE-ACETAMINOPHEN 5-325 MG PO TABS: 1.0000 | ORAL_TABLET | ORAL | Status: DC | PRN

## 2014-11-29 MED ORDER — CIPROFLOXACIN HCL 500 MG PO TABS: 500.0000 mg | ORAL_TABLET | Freq: Two times a day (BID) | ORAL | Status: DC

## 2014-11-29 MED ORDER — METRONIDAZOLE 500 MG PO TABS: 500.0000 mg | ORAL_TABLET | Freq: Three times a day (TID) | ORAL | Status: DC

## 2014-11-29 MED ORDER — PROMETHAZINE HCL 25 MG PO TABS: 25.0000 mg | ORAL_TABLET | Freq: Four times a day (QID) | ORAL | Status: DC | PRN

## 2014-11-29 MED ORDER — ONDANSETRON HCL 4 MG/2ML IJ SOLN
4.0000 mg | Freq: Once | INTRAMUSCULAR | Status: DC | PRN
  Filled 2014-11-29: qty 2

## 2014-11-29 NOTE — ED Provider Notes (Signed)
CSN: [id number]     Arrival date & time 11/28/14  1921 History   First MD Initiated Contact with Patient 11/28/14 1946     Chief Complaint  Patient presents with  . Abdominal Pain     (Consider location/radiation/quality/duration/timing/severity/associated sxs/prior Treatment) HPI   39ym with abdominal pain. Hx of diverticulitis s/p partial colectomy 10/10. Reports had been recovering well from surgery until began having pain reminiscent of prior diverticulitis earlier today. Llq. Progressive/constant. Worse with certain movements. +n/v. No fever or chills. No urinary complaints.   Past Medical History  Diagnosis Date  . Arthritis   . Kidney stones   . Diverticulitis    Past Surgical History  Procedure Laterality Date  . Vasectomy  2009  . 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 Tonsillectomy    . Laparoscopic sigmoid colectomy N/A 11/05/2014    Procedure: LAPAROSCOPIC SIGMOID COLECTOMY;  Surgeon: [name], MD;  Location: [facility];  Service: General;  Laterality: N/A;   Family History  Problem Relation Age of Onset  . Heart disease Mother   . Kidney Stones    . Gallstones     Social History  Substance Use Topics  . Smoking status: Former Smoker -- 0.25 packs/day    Types: Cigarettes    Quit date: 01/26/2013  . Smokeless tobacco: Never Used  . Alcohol Use: Yes     Comment: occasional / NONE SINCE JUNE    Review of Systems  All systems reviewed and negative, other than as noted in HPI.   Allergies  Review of patient's allergies indicates no known allergies.  Home Medications   Prior to Admission medications   Medication Sig Start Date End Date Taking? Authorizing Provider  ondansetron (ZOFRAN ODT) 8 MG disintegrating tablet Take 1 tablet (8 mg total) by mouth every 8 (eight) hours as needed for nausea or vomiting. 10/26/14  Yes Tatyana Kirichenko, PA-C  oxyCODONE 10 MG TABS Take 1 tablet (10 mg total) by mouth every 4 (four) hours as needed for moderate  pain. 11/08/14  Yes [name], MD  ciprofloxacin (CIPRO) 500 MG tablet Take 1 tablet (500 mg total) by mouth every 12 (twelve) hours. 11/29/14   Raeford Razor, MD  metroNIDAZOLE (FLAGYL) 500 MG tablet Take 1 tablet (500 mg total) by mouth 3 (three) times daily. 11/29/14   Raeford Razor, MD  oxyCODONE-acetaminophen (PERCOCET/ROXICET) 5-325 MG tablet Take 1-2 tablets by mouth every 4 (four) hours as needed for severe pain. 11/29/14   Raeford Razor, MD  promethazine (PHENERGAN) 25 MG suppository Place 1 suppository (25 mg total) rectally every 6 (six) hours as needed for nausea or vomiting. 10/24/14   Elpidio Anis, PA-C  promethazine (PHENERGAN) 25 MG tablet Take 1 tablet (25 mg total) by mouth every 6 (six) hours as needed for nausea or vomiting. 11/29/14   Raeford Razor, MD  traMADol (ULTRAM) 50 MG tablet Take 50 mg by mouth every 8 (eight) hours as needed for moderate pain or severe pain (pain).  07/03/10   Historical Provider, MD   BP 168/88 mmHg  Pulse 70  Temp(Src) 98.2 F (36.8 C) (Oral)  Resp 16  SpO2 99% Physical Exam  Constitutional: He appears well-developed and well-nourished. No distress.  HENT:  Head: Normocephalic and atraumatic.  Eyes: Conjunctivae are normal. Right eye exhibits no discharge. Left eye exhibits no discharge.  Neck: Neck supple.  Cardiovascular: Normal rate, regular rhythm and normal heart sounds.  Exam reveals no gallop and no friction rub.   No murmur heard.  Pulmonary/Chest: Effort normal and breath sounds normal. No respiratory distress.  Abdominal: Soft. He exhibits no distension. There is tenderness. There is no rebound and no guarding.  Suprapubic and llq tenderness  Musculoskeletal: He exhibits no edema or tenderness.  Neurological: He is alert.  Skin: Skin is warm and dry.  Psychiatric: He has a normal mood and affect. His behavior is normal. Thought content normal.  Nursing note and vitals reviewed.   ED Course  Procedures (including critical  care time) Labs Review Labs Reviewed  COMPREHENSIVE METABOLIC PANEL - Abnormal; Notable for the following:    CO2 18 (*)    Glucose, Bld 141 (*)    Total Bilirubin 1.3 (*)    All other components within normal limits  CBC - Abnormal; Notable for the following:    WBC 11.2 (*)    All other components within normal limits  URINALYSIS, ROUTINE W REFLEX MICROSCOPIC (NOT AT [facility]) - Abnormal; Notable for the following:    APPearance CLOUDY (*)    Ketones, ur >80 (*)    All other components within normal limits  LIPASE, BLOOD    Imaging Review Ct Abdomen Pelvis W Contrast  11/28/2014  CLINICAL DATA:  39 year old with recent sigmoid colonic resection for diverticular disease on 11/05/2014, presenting with acute onset of left lower quadrant abdominal pain, nausea and vomiting today. Surgical history also includes laparoscopic gastric banding. EXAM: CT ABDOMEN AND PELVIS WITH CONTRAST TECHNIQUE: Multidetector CT imaging of the abdomen and pelvis was performed using the standard protocol following bolus administration of intravenous contrast. CONTRAST:  OMNIPAQUE IOHEXOL 300 MG/ML IV. Oral contrast was also administered. COMPARISON:  09/23/2014, 09/13/2014, 07/18/2014, 06/05/2014. FINDINGS: Lower chest:  Heart size normal.  Visualized lung bases clear. Hepatobiliary: Liver normal in size in appearance. Small gallstones within the gallbladder. No pericholecystic edema or inflammation. No biliary ductal dilation. Pancreas: Normal in appearance without evidence of mass, ductal dilation, or inflammation. Spleen: Normal in size and appearance. Adrenals/Urinary Tract: Normal appearing adrenal glands. Nonobstructing approximate 3 mm calculus in a mid calix of the left kidney. Nonobstructing 1-2 mm calculus in a mid calix of the right kidney. No obstructing ureteral calculi on either side. No focal parenchymal abnormality involving either kidney. Urinary bladder with mild circumferential wall thickening which  is unchanged and due to the fact that the bladder is decompressed. Stomach/Bowel: Gastric band unchanged in position or orientation, shown on the scout image to lie in a 2:00 to 8:00 position. No evidence of hiatal hernia. Stomach normal in appearance filled with oral contrast and food. Normal-appearing small bowel. Diverticulosis involving the distal descending colon. Mild hyperemia and pericolonic edema involving the distal descending colon several cm proximal to the anastomosis. Colocolic anastomosis in the pelvis widely patent. No abnormal fluid collections. Normal appendix in the right mid abdomen. Vascular/Lymphatic: No pathologic lymphadenopathy. No visible aortoiliofemoral atherosclerosis. Widely patent visceral arteries. Reproductive: Prostate gland and seminal vesicles normal in size and appearance for age. Other: Multiple phleboliths low in both sides of the pelvis. Musculoskeletal: Degenerative disc disease and spondylosis involving the visualized lower thoracic spine. Hemangioma in the L3 vertebral body. Regional skeleton otherwise unremarkable. IMPRESSION: 1. Mild acute diverticulitis involving the distal descending colon proximal to the colocolic anastomosis. 2. Satisfactory appearance to the colocolic anastomosis without evidence of leak. 3. Nonobstructing bilateral renal calculi. 4. Gastric band unchanged in position and without complicating features. Electronically Signed   By: Hulan Saas M.D.   On: 11/28/2014 22:47   I have personally reviewed  and evaluated these images and lab results as part of my medical decision-making.   EKG Interpretation None      MDM   Final diagnoses:  Acute diverticulitis    39 year old male with left lower quadrant pain. Recent partial colectomy for diverticular disease. CT today shows mild acute diverticulitis proximal to the anastomosis. No signs of acute surgical complication. Will be placed on ciprofloxacin and Flagyl. As needed pain medication  and nausea medication. Patient reports previously scheduled appointment with the surgeon in 2 days. Return precautions were discussed.    Raeford RazorStephen Corrin Sieling, MD 12/09/14 2034

## 2014-11-29 NOTE — Discharge Instructions (Signed)
Diverticulitis °Diverticulitis is inflammation or infection of small pouches in your colon that form when you have a condition called diverticulosis. The pouches in your colon are called diverticula. Your colon, or large intestine, is where water is absorbed and stool is formed. °Complications of diverticulitis can include: °· Bleeding. °· Severe infection. °· Severe pain. °· Perforation of your colon. °· Obstruction of your colon. °CAUSES  °Diverticulitis is caused by bacteria. °Diverticulitis happens when stool becomes trapped in diverticula. This allows bacteria to grow in the diverticula, which can lead to inflammation and infection. °RISK FACTORS °People with diverticulosis are at risk for diverticulitis. Eating a diet that does not include enough fiber from fruits and vegetables may make diverticulitis more likely to develop. °SYMPTOMS  °Symptoms of diverticulitis may include: °· Abdominal pain and tenderness. The pain is normally located on the left side of the abdomen, but may occur in other areas. °· Fever and chills. °· Bloating. °· Cramping. °· Nausea. °· Vomiting. °· Constipation. °· Diarrhea. °· Blood in your stool. °DIAGNOSIS  °Your health care provider will ask you about your medical history and do a physical exam. You may need to have tests done because many medical conditions can cause the same symptoms as diverticulitis. Tests may include: °· Blood tests. °· Urine tests. °· Imaging tests of the abdomen, including X-rays and CT scans. °When your condition is under control, your health care provider may recommend that you have a colonoscopy. A colonoscopy can show how severe your diverticula are and whether something else is causing your symptoms. °TREATMENT  °Most cases of diverticulitis are mild and can be treated at home. Treatment may include: °· Taking over-the-counter pain medicines. °· Following a clear liquid diet. °· Taking antibiotic medicines by mouth for 7-10 days. °More severe cases may  be treated at a hospital. Treatment may include: °· Not eating or drinking. °· Taking prescription pain medicine. °· Receiving antibiotic medicines through an IV tube. °· Receiving fluids and nutrition through an IV tube. °· Surgery. °HOME CARE INSTRUCTIONS  °· Follow your health care provider's instructions carefully. °· Follow a full liquid diet or other diet as directed by your health care provider. After your symptoms improve, your health care provider may tell you to change your diet. He or she may recommend you eat a high-fiber diet. Fruits and vegetables are good sources of fiber. Fiber makes it easier to pass stool. °· Take fiber supplements or probiotics as directed by your health care provider. °· Only take medicines as directed by your health care provider. °· Keep all your follow-up appointments. °SEEK MEDICAL CARE IF:  °· Your pain does not improve. °· You have a hard time eating food. °· Your bowel movements do not return to normal. °SEEK IMMEDIATE MEDICAL CARE IF:  °· Your pain becomes worse. °· Your symptoms do not get better. °· Your symptoms suddenly get worse. °· You have a fever. °· You have repeated vomiting. °· You have bloody or black, tarry stools. °MAKE SURE YOU:  °· Understand these instructions. °· Will watch your condition. °· Will get help right away if you are not doing well or get worse. °  °This information is not intended to replace advice given to you by your health care provider. Make sure you discuss any questions you have with your health care provider. °  °Document Released: 10/22/2004 Document Revised: 01/17/2013 Document Reviewed: 12/07/2012 °Elsevier Interactive Patient Education ©2016 [employer] ° °

## 2014-11-30 ENCOUNTER — Emergency Department (HOSPITAL_COMMUNITY): Payer: BLUE CROSS/BLUE SHIELD

## 2014-11-30 ENCOUNTER — Inpatient Hospital Stay (HOSPITAL_COMMUNITY)
Admission: EM | Admit: 2014-11-30 | Discharge: 2014-12-03 | DRG: 948 | Disposition: A | Discharge status: Home / Self Care | Payer: BLUE CROSS/BLUE SHIELD | Attending: General Surgery | Admitting: General Surgery

## 2014-11-30 ENCOUNTER — Encounter (HOSPITAL_COMMUNITY): Payer: Self-pay | Admitting: Emergency Medicine

## 2014-11-30 DIAGNOSIS — Z87891 Personal history of nicotine dependence: Secondary | ICD-10-CM

## 2014-11-30 DIAGNOSIS — G8918 Other acute postprocedural pain: Secondary | ICD-10-CM | POA: Diagnosis present

## 2014-11-30 DIAGNOSIS — R1032 Left lower quadrant pain: Secondary | ICD-10-CM | POA: Diagnosis present

## 2014-11-30 DIAGNOSIS — Z79899 Other long term (current) drug therapy: Secondary | ICD-10-CM

## 2014-11-30 DIAGNOSIS — M7989 Other specified soft tissue disorders: Secondary | ICD-10-CM

## 2014-11-30 DIAGNOSIS — Z79891 Long term (current) use of opiate analgesic: Secondary | ICD-10-CM | POA: Diagnosis not present

## 2014-11-30 DIAGNOSIS — Z23 Encounter for immunization: Secondary | ICD-10-CM

## 2014-11-30 DIAGNOSIS — K5732 Diverticulitis of large intestine without perforation or abscess without bleeding: Secondary | ICD-10-CM | POA: Diagnosis present

## 2014-11-30 DIAGNOSIS — K5792 Diverticulitis of intestine, part unspecified, without perforation or abscess without bleeding: Secondary | ICD-10-CM

## 2014-11-30 LAB — URINALYSIS, ROUTINE W REFLEX MICROSCOPIC
Bilirubin Urine: NEGATIVE
Glucose, UA: NEGATIVE mg/dL
Hgb urine dipstick: NEGATIVE
KETONES UR: 40 mg/dL — AB
LEUKOCYTES UA: NEGATIVE
NITRITE: NEGATIVE
PROTEIN: NEGATIVE mg/dL
Specific Gravity, Urine: 1.013 (ref 1.005–1.030)
UROBILINOGEN UA: 0.2 mg/dL (ref 0.0–1.0)
pH: 7.5 (ref 5.0–8.0)

## 2014-11-30 LAB — CBC
HCT: 45.1 % (ref 39.0–52.0)
HEMOGLOBIN: 15.8 g/dL (ref 13.0–17.0)
MCH: 31.9 pg (ref 26.0–34.0)
MCHC: 35 g/dL (ref 30.0–36.0)
MCV: 91.1 fL (ref 78.0–100.0)
PLATELETS: 260 10*3/uL (ref 150–400)
RBC: 4.95 MIL/uL (ref 4.22–5.81)
RDW: 12.6 % (ref 11.5–15.5)
WBC: 7.3 10*3/uL (ref 4.0–10.5)

## 2014-11-30 LAB — COMPREHENSIVE METABOLIC PANEL
ALT: 20 U/L (ref 17–63)
AST: 28 U/L (ref 15–41)
Albumin: 4.9 g/dL (ref 3.5–5.0)
Alkaline Phosphatase: 66 U/L (ref 38–126)
Anion gap: 12 (ref 5–15)
BUN: 11 mg/dL (ref 6–20)
CHLORIDE: 104 mmol/L (ref 101–111)
CO2: 22 mmol/L (ref 22–32)
CREATININE: 0.68 mg/dL (ref 0.61–1.24)
Calcium: 9.7 mg/dL (ref 8.9–10.3)
GFR calc Af Amer: >60 mL/min (ref >60)
GFR calc non Af Amer: >60 mL/min (ref >60)
GLUCOSE: 107 mg/dL — AB (ref 65–99)
Potassium: 3.7 mmol/L (ref 3.5–5.1)
SODIUM: 138 mmol/L (ref 135–145)
Total Bilirubin: 0.9 mg/dL (ref 0.3–1.2)
Total Protein: 7.8 g/dL (ref 6.5–8.1)

## 2014-11-30 LAB — LIPASE, BLOOD: Lipase: 25 U/L (ref 11–51)

## 2014-11-30 MED ORDER — PROMETHAZINE HCL 25 MG PO TABS: 25.0000 mg | ORAL_TABLET | Freq: Four times a day (QID) | ORAL | Status: DC | PRN

## 2014-11-30 MED ORDER — ONDANSETRON HCL 4 MG/2ML IJ SOLN
4.0000 mg | Freq: Four times a day (QID) | INTRAMUSCULAR | Status: DC | PRN
  Administered 2014-12-01 (×2): 4 mg via INTRAVENOUS
  Filled 2014-11-30 (×2): qty 2

## 2014-11-30 MED ORDER — HYDROMORPHONE HCL 2 MG/ML IJ SOLN
2.0000 mg | Freq: Once | INTRAMUSCULAR | Status: AC
  Administered 2014-11-30: 2 mg via INTRAVENOUS
  Filled 2014-11-30: qty 1

## 2014-11-30 MED ORDER — INFLUENZA VAC SPLIT QUAD 0.5 ML IM SUSY
0.5000 mL | PREFILLED_SYRINGE | INTRAMUSCULAR | Status: AC
  Administered 2014-12-01: 0.5 mL via INTRAMUSCULAR
  Filled 2014-11-30 (×2): qty 0.5

## 2014-11-30 MED ORDER — SODIUM CHLORIDE 0.9 % IV BOLUS (SEPSIS)
1000.0000 mL | Freq: Once | INTRAVENOUS | Status: AC
  Administered 2014-11-30: 1000 mL via INTRAVENOUS

## 2014-11-30 MED ORDER — ONDANSETRON HCL 4 MG/2ML IJ SOLN
4.0000 mg | Freq: Once | INTRAMUSCULAR | Status: DC
  Filled 2014-11-30: qty 2

## 2014-11-30 MED ORDER — PROMETHAZINE HCL 25 MG/ML IJ SOLN
12.5000 mg | Freq: Once | INTRAMUSCULAR | Status: AC
  Administered 2014-11-30: 12.5 mg via INTRAVENOUS
  Filled 2014-11-30: qty 1

## 2014-11-30 MED ORDER — METRONIDAZOLE IN NACL 5-0.79 MG/ML-% IV SOLN
500.0000 mg | Freq: Three times a day (TID) | INTRAVENOUS | Status: DC
  Administered 2014-11-30 – 2014-12-02 (×5): 500 mg via INTRAVENOUS
  Filled 2014-11-30 (×7): qty 100

## 2014-11-30 MED ORDER — POTASSIUM CHLORIDE IN NACL 20-0.9 MEQ/L-% IV SOLN
INTRAVENOUS | Status: DC
  Administered 2014-11-30 – 2014-12-01 (×2): via INTRAVENOUS
  Filled 2014-11-30 (×4): qty 1000

## 2014-11-30 MED ORDER — DIPHENHYDRAMINE HCL 50 MG/ML IJ SOLN
25.0000 mg | Freq: Four times a day (QID) | INTRAMUSCULAR | Status: DC | PRN
  Administered 2014-12-02: 25 mg via INTRAVENOUS
  Filled 2014-11-30 (×2): qty 1

## 2014-11-30 MED ORDER — CEFTRIAXONE SODIUM 2 G IJ SOLR
2.0000 g | INTRAMUSCULAR | Status: DC
  Administered 2014-11-30 – 2014-12-01 (×2): 2 g via INTRAVENOUS
  Filled 2014-11-30 (×3): qty 2

## 2014-11-30 MED ORDER — KETOROLAC TROMETHAMINE 15 MG/ML IJ SOLN
15.0000 mg | Freq: Four times a day (QID) | INTRAMUSCULAR | Status: AC
  Administered 2014-11-30: 15 mg via INTRAVENOUS
  Filled 2014-11-30: qty 1

## 2014-11-30 MED ORDER — KETOROLAC TROMETHAMINE 15 MG/ML IJ SOLN: 15.0000 mg | Freq: Four times a day (QID) | INTRAMUSCULAR | Status: DC | PRN

## 2014-11-30 MED ORDER — ONDANSETRON HCL 4 MG/2ML IJ SOLN
4.0000 mg | Freq: Once | INTRAMUSCULAR | Status: AC
  Administered 2014-11-30: 4 mg via INTRAVENOUS
  Filled 2014-11-30: qty 2

## 2014-11-30 MED ORDER — ZOLPIDEM TARTRATE 5 MG PO TABS
5.0000 mg | ORAL_TABLET | Freq: Every evening | ORAL | Status: DC | PRN
  Administered 2014-11-30 – 2014-12-02 (×2): 5 mg via ORAL
  Filled 2014-11-30 (×3): qty 1

## 2014-11-30 MED ORDER — ONDANSETRON 4 MG PO TBDP
4.0000 mg | ORAL_TABLET | Freq: Four times a day (QID) | ORAL | Status: DC | PRN
Start: 2014-11-30 — End: 2014-12-03

## 2014-11-30 MED ORDER — DIPHENHYDRAMINE HCL 25 MG PO CAPS
25.0000 mg | ORAL_CAPSULE | Freq: Four times a day (QID) | ORAL | Status: DC | PRN
  Administered 2014-12-01 – 2014-12-03 (×4): 25 mg via ORAL
  Filled 2014-11-30 (×3): qty 1

## 2014-11-30 MED ORDER — HYDROMORPHONE HCL 1 MG/ML IJ SOLN
1.0000 mg | Freq: Once | INTRAMUSCULAR | Status: AC
  Administered 2014-11-30: 1 mg via INTRAVENOUS
  Filled 2014-11-30: qty 1

## 2014-11-30 MED ORDER — TRAMADOL HCL 50 MG PO TABS: 50.0000 mg | ORAL_TABLET | Freq: Three times a day (TID) | ORAL | Status: DC | PRN

## 2014-11-30 MED ORDER — IOHEXOL 300 MG/ML  SOLN
100.0000 mL | Freq: Once | INTRAMUSCULAR | Status: AC | PRN
  Administered 2014-11-30: 100 mL via INTRAVENOUS

## 2014-11-30 MED ORDER — ONDANSETRON 4 MG PO TBDP: 8.0000 mg | ORAL_TABLET | Freq: Three times a day (TID) | ORAL | Status: DC | PRN

## 2014-11-30 MED ORDER — MORPHINE SULFATE (PF) 2 MG/ML IV SOLN
1.0000 mg | INTRAVENOUS | Status: DC | PRN
  Administered 2014-11-30 (×3): 2 mg via INTRAVENOUS
  Administered 2014-12-01 (×2): 3 mg via INTRAVENOUS
  Administered 2014-12-01 – 2014-12-02 (×5): 2 mg via INTRAVENOUS
  Administered 2014-12-02 (×2): 3 mg via INTRAVENOUS
  Filled 2014-11-30 (×3): qty 1
  Filled 2014-11-30: qty 2
  Filled 2014-11-30: qty 1
  Filled 2014-11-30: qty 2
  Filled 2014-11-30: qty 1
  Filled 2014-11-30 (×3): qty 2
  Filled 2014-11-30 (×3): qty 1

## 2014-11-30 MED ORDER — ENOXAPARIN SODIUM 40 MG/0.4ML ~~LOC~~ SOLN
40.0000 mg | SUBCUTANEOUS | Status: DC
Start: 2014-11-30 — End: 2014-12-03
  Administered 2014-11-30 – 2014-12-02 (×3): 40 mg via SUBCUTANEOUS
  Filled 2014-11-30 (×4): qty 0.4

## 2014-11-30 NOTE — ED Notes (Signed)
Bed: WA02 Expected date:  Expected time:  Means of arrival:  Comments: Hold for triage 1 

## 2014-11-30 NOTE — Progress Notes (Signed)
Patient reported to staff that his wallet is missing and wanted to know if inventory was taken.  Unit secretary was notified and following up with ED security. Reviewed documentation with patient and explained to patient that clothes such as paints are not searched unless he is admitted to a behavioral unit.  Valuables are asked if mentioned will be placed in the safe or sent home.  Patient is in agreement with the notice and family present in the room will further continue searching at home.

## 2014-11-30 NOTE — ED Notes (Signed)
Per pt, states he was here for same symptoms on the 2 nd-recently colon surgery, increased nausea and pain

## 2014-11-30 NOTE — ED Provider Notes (Signed)
CSN: [id number]     Arrival date & time 11/30/14  1238 History   First MD Initiated Contact with Patient 11/30/14 1306     Chief Complaint  Patient presents with  . Abdominal Pain     (Consider location/radiation/quality/duration/timing/severity/associated sxs/prior Treatment) Patient is a 39 y.o. male presenting with abdominal pain. The history is provided by the patient (Patient complains of left lower quadrant abdominal pain he was diagnosed with diverticulitis a couple days ago).  Abdominal Pain Pain location:  LLQ Pain quality: aching   Pain radiates to:  Does not radiate Pain severity:  Moderate Onset quality:  Gradual Timing:  Constant Progression:  Worsening Chronicity:  Recurrent Context: not alcohol use   Associated symptoms: no chest pain, no cough, no diarrhea, no fatigue and no hematuria     Past Medical History  Diagnosis Date  . Arthritis   . Kidney stones   . Diverticulitis    Past Surgical History  Procedure Laterality Date  . Vasectomy  2009  . 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 Tonsillectomy    . Laparoscopic sigmoid colectomy N/A 11/05/2014    Procedure: LAPAROSCOPIC SIGMOID COLECTOMY;  Surgeon: [name], MD;  Location: [facility];  Service: General;  Laterality: N/A;   Family History  Problem Relation Age of Onset  . Heart disease Mother   . Kidney Stones    . Gallstones     Social History  Substance Use Topics  . Smoking status: Former Smoker -- 0.25 packs/day    Types: Cigarettes    Quit date: 01/26/2013  . Smokeless tobacco: Never Used  . Alcohol Use: Yes     Comment: occasional / NONE SINCE JUNE    Review of Systems  Constitutional: Negative for appetite change and fatigue.  HENT: Negative for congestion, ear discharge and sinus pressure.   Eyes: Negative for discharge.  Respiratory: Negative for cough.   Cardiovascular: Negative for chest pain.  Gastrointestinal: Positive for abdominal pain. Negative for diarrhea.   Genitourinary: Negative for frequency and hematuria.  Musculoskeletal: Negative for back pain.  Skin: Negative for rash.  Neurological: Negative for seizures and headaches.  Psychiatric/Behavioral: Negative for hallucinations.      Allergies  Review of patient's allergies indicates no known allergies.  Home Medications   Prior to Admission medications   Medication Sig Start Date End Date Taking? Authorizing Provider  ciprofloxacin (CIPRO) 500 MG tablet Take 1 tablet (500 mg total) by mouth every 12 (twelve) hours. 11/29/14  Yes Raeford Razor, MD  metroNIDAZOLE (FLAGYL) 500 MG tablet Take 1 tablet (500 mg total) by mouth 3 (three) times daily. 11/29/14  Yes Raeford Razor, MD  ondansetron (ZOFRAN ODT) 8 MG disintegrating tablet Take 1 tablet (8 mg total) by mouth every 8 (eight) hours as needed for nausea or vomiting. 10/26/14  Yes Tatyana Kirichenko, PA-C  traMADol (ULTRAM) 50 MG tablet Take 50 mg by mouth every 8 (eight) hours as needed for moderate pain or severe pain (pain).  07/03/10  Yes Historical Provider, MD  oxyCODONE 10 MG TABS Take 1 tablet (10 mg total) by mouth every 4 (four) hours as needed for moderate pain. Patient not taking: Reported on 11/30/2014 11/08/14   [name], MD  oxyCODONE-acetaminophen (PERCOCET/ROXICET) 5-325 MG tablet Take 1-2 tablets by mouth every 4 (four) hours as needed for severe pain. Patient not taking: Reported on 11/30/2014 11/29/14   Raeford Razor, MD  promethazine (PHENERGAN) 25 MG suppository Place 1 suppository (25 mg total) rectally every 6 (six)  hours as needed for nausea or vomiting. Patient not taking: Reported on 11/30/2014 10/24/14   Elpidio AnisShari Upstill, PA-C  promethazine (PHENERGAN) 25 MG tablet Take 1 tablet (25 mg total) by mouth every 6 (six) hours as needed for nausea or vomiting. Patient not taking: Reported on 11/30/2014 11/29/14   Raeford RazorStephen Kohut, MD   BP 187/116 mmHg  Pulse 86  Temp(Src) 97.5 F (36.4 C) (Oral)  Resp 22  SpO2  98% Physical Exam  Constitutional: He is oriented to person, place, and time. He appears well-developed.  HENT:  Head: Normocephalic.  Eyes: Conjunctivae and EOM are normal. No scleral icterus.  Neck: Neck supple. No thyromegaly present.  Cardiovascular: Normal rate and regular rhythm.  Exam reveals no gallop and no friction rub.   No murmur heard. Pulmonary/Chest: No stridor. He has no wheezes. He has no rales. He exhibits no tenderness.  Abdominal: He exhibits no distension. There is tenderness. There is no rebound.  Musculoskeletal: Normal range of motion. He exhibits no edema.  Lymphadenopathy:    He has no cervical adenopathy.  Neurological: He is oriented to person, place, and time. He exhibits normal muscle tone. Coordination normal.  Skin: No rash noted. No erythema.  Psychiatric: He has a normal mood and affect. His behavior is normal.    ED Course  Procedures (including critical care time) Labs Review Labs Reviewed  COMPREHENSIVE METABOLIC PANEL - Abnormal; Notable for the following:    Glucose, Bld 107 (*)    All other components within normal limits  URINALYSIS, ROUTINE W REFLEX MICROSCOPIC (NOT [facility]) - Abnormal; Notable for the following:    Ketones, ur 40 (*)    All other components within normal limits  LIPASE, BLOOD  CBC  CBC  CREATININE, SERUM    Imaging Review Ct Abdomen Pelvis W Contrast  11/28/2014  CLINICAL DATA:  39 year old with recent sigmoid colonic resection for diverticular disease on 11/05/2014, presenting with acute onset of left lower quadrant abdominal pain, nausea and vomiting today. Surgical history also includes laparoscopic gastric banding. EXAM: CT ABDOMEN AND PELVIS WITH CONTRAST TECHNIQUE: Multidetector CT imaging of the abdomen and pelvis was performed using the standard protocol following bolus administration of intravenous contrast. CONTRAST:  100mL OMNIPAQUE IOHEXOL 300 MG/ML IV. Oral contrast was also administered. COMPARISON:   09/23/2014, 09/13/2014, 07/18/2014, 06/05/2014. FINDINGS: Lower chest:  Heart size normal.  Visualized lung bases clear. Hepatobiliary: Liver normal in size in appearance. Small gallstones within the gallbladder. No pericholecystic edema or inflammation. No biliary ductal dilation. Pancreas: Normal in appearance without evidence of mass, ductal dilation, or inflammation. Spleen: Normal in size and appearance. Adrenals/Urinary Tract: Normal appearing adrenal glands. Nonobstructing approximate 3 mm calculus in a mid calix of the left kidney. Nonobstructing 1-2 mm calculus in a mid calix of the right kidney. No obstructing ureteral calculi on either side. No focal parenchymal abnormality involving either kidney. Urinary bladder with mild circumferential wall thickening which is unchanged and due to the fact that the bladder is decompressed. Stomach/Bowel: Gastric band unchanged in position or orientation, shown on the scout image to lie in a 2:00 to 8:00 position. No evidence of hiatal hernia. Stomach normal in appearance filled with oral contrast and food. Normal-appearing small bowel. Diverticulosis involving the distal descending colon. Mild hyperemia and pericolonic edema involving the distal descending colon several cm proximal to the anastomosis. Colocolic anastomosis in the pelvis widely patent. No abnormal fluid collections. Normal appendix in the right mid abdomen. Vascular/Lymphatic: No pathologic lymphadenopathy. No visible aortoiliofemoral  atherosclerosis. Widely patent visceral arteries. Reproductive: Prostate gland and seminal vesicles normal in size and appearance for age. Other: Multiple phleboliths low in both sides of the pelvis. Musculoskeletal: Degenerative disc disease and spondylosis involving the visualized lower thoracic spine. Hemangioma in the L3 vertebral body. Regional skeleton otherwise unremarkable. IMPRESSION: 1. Mild acute diverticulitis involving the distal descending colon proximal to  the colocolic anastomosis. 2. Satisfactory appearance to the colocolic anastomosis without evidence of leak. 3. Nonobstructing bilateral renal calculi. 4. Gastric band unchanged in position and without complicating features. Electronically Signed   By: Hulan Saas M.D.   On: 11/28/2014 22:47   I have personally reviewed and evaluated these images and lab results as part of my medical decision-making.   EKG Interpretation None      MDM   Final diagnoses:  Diverticulitis of large intestine without perforation or abscess without bleeding    Patient is being admitted for diverticulitis    Bethann Berkshire, MD 11/30/14 1535

## 2014-11-30 NOTE — H&P (Signed)
Barry Parsons is an 39 y.o. male.   Chief Complaint: abdominal pain, nausea and vomiting. HPI: 39 y/o male with recurrent diverticulitis, from May thru November 05, 2014, when he underwent Laparoscopic sigmoid colectomy by Dr. Excell Seltzer, he went home on 10/29/14.  Marland Kitchen  He did well until last week when he came to the Ed with severe pain and was found to have another bout of diverticulitis proximal to the new colectomy anastomosis.  He was sent home on Cipro and Flagyl.  He said he did better for 24 hours, but now is back with 10/10 pain, writhing in bed with pain, intermittent dry heaves.  Pretty much inconsolable.  He cannot lie still but moves well in bed.  We are ask to see.  Dr. Dalbert Batman was in and he told Dr. Dalbert Batman he just wanted them to take it out and get it over with.   Work up in the ED so far includes significant hypertension on admit 187/116.  HR 86, 98% on room air.  CMP is nromal, WBC is 7.3, H/h is stable.  Plain films ordered but not done because of patient's pain level.  Past Medical History.  Diagnosis Date  . Arthritis   . Kidney stones    Hx of prior lap band, has gone from 320 to 249 in September 2016    Hx of tobacco use   . Diverticulitis     Past Surgical History  Procedure Laterality Date  . Vasectomy  2009  . 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 Tonsillectomy    . Laparoscopic sigmoid colectomy N/A 11/05/2014    Procedure: LAPAROSCOPIC SIGMOID COLECTOMY;  Surgeon: Excell Seltzer, MD;  Location: [facility];  Service: General;  Laterality: N/A;    Family History  Problem Relation Age of Onset  . Heart disease Mother   . Kidney Stones    . Gallstones     Social History:  reports that he quit smoking about 22 months ago. His smoking use included Cigarettes. He smoked 0.25 packs per day. He has never used smokeless tobacco. He reports that he drinks alcohol. He reports that he does not use illicit drugs.  Allergies: No Known Allergies  Prior to Admission medications    Medication Sig Start Date End Date Taking? Authorizing Provider  ciprofloxacin (CIPRO) 500 MG tablet Take 1 tablet (500 mg total) by mouth every 12 (twelve) hours. 11/29/14  Yes Virgel Manifold, MD  metroNIDAZOLE (FLAGYL) 500 MG tablet Take 1 tablet (500 mg total) by mouth 3 (three) times daily. 11/29/14  Yes Virgel Manifold, MD  ondansetron (ZOFRAN ODT) 8 MG disintegrating tablet Take 1 tablet (8 mg total) by mouth every 8 (eight) hours as needed for nausea or vomiting. 10/26/14  Yes Tatyana Kirichenko, PA-C  traMADol (ULTRAM) 50 MG tablet Take 50 mg by mouth every 8 (eight) hours as needed for moderate pain or severe pain (pain).  07/03/10  Yes Historical Provider, MD  oxyCODONE 10 MG TABS Take 1 tablet (10 mg total) by mouth every 4 (four) hours as needed for moderate pain. Patient not taking: Reported on 11/30/2014 11/08/14   Excell Seltzer, MD  oxyCODONE-acetaminophen (PERCOCET/ROXICET) 5-325 MG tablet Take 1-2 tablets by mouth every 4 (four) hours as needed for severe pain. Patient not taking: Reported on 11/30/2014 11/29/14   Virgel Manifold, MD  promethazine (PHENERGAN) 25 MG suppository Place 1 suppository (25 mg total) rectally every 6 (six) hours as needed for nausea or vomiting. Patient not taking: Reported on 11/30/2014 10/24/14  Charlann Lange, PA-C  promethazine (PHENERGAN) 25 MG tablet Take 1 tablet (25 mg total) by mouth every 6 (six) hours as needed for nausea or vomiting. Patient not taking: Reported on 11/30/2014 11/29/14   Virgel Manifold, MD     Results for orders placed or performed during the hospital encounter of 11/30/14 (from the past 48 hour(s))  Lipase, blood     Status: None   Collection Time: 11/30/14 12:50 PM  Result Value Ref Range   Lipase 25 11 - 51 U/L  Comprehensive metabolic panel     Status: Abnormal   Collection Time: 11/30/14 12:50 PM  Result Value Ref Range   Sodium 138 135 - 145 mmol/L   Potassium 3.7 3.5 - 5.1 mmol/L   Chloride 104 101 - 111 mmol/L   CO2 22 22  - 32 mmol/L   Glucose, Bld 107 (H) 65 - 99 mg/dL   BUN 11 6 - 20 mg/dL   Creatinine, Ser 0.68 0.61 - 1.24 mg/dL   Calcium 9.7 8.9 - 10.3 mg/dL   Total Protein 7.8 6.5 - 8.1 g/dL   Albumin 4.9 3.5 - 5.0 g/dL   AST 28 15 - 41 U/L   ALT 20 17 - 63 U/L   Alkaline Phosphatase 66 38 - 126 U/L   Total Bilirubin 0.9 0.3 - 1.2 mg/dL   GFR calc non Af Amer >60 >60 mL/min   GFR calc Af Amer >60 >60 mL/min    Comment: (NOTE) The eGFR has been calculated using the CKD EPI equation. This calculation has not been validated in all clinical situations. eGFR's persistently <60 mL/min signify possible Chronic Kidney Disease.    Anion gap 12 5 - 15  CBC     Status: None   Collection Time: 11/30/14 12:50 PM  Result Value Ref Range   WBC 7.3 4.0 - 10.5 K/uL   RBC 4.95 4.22 - 5.81 MIL/uL   Hemoglobin 15.8 13.0 - 17.0 g/dL   HCT 45.1 39.0 - 52.0 %   MCV 91.1 78.0 - 100.0 fL   MCH 31.9 26.0 - 34.0 pg   MCHC 35.0 30.0 - 36.0 g/dL   RDW 12.6 11.5 - 15.5 %   Platelets 260 150 - 400 K/uL   Ct Abdomen Pelvis W Contrast  11/28/2014  CLINICAL DATA:  39 year old with recent sigmoid colonic resection for diverticular disease on 11/05/2014, presenting with acute onset of left lower quadrant abdominal pain, nausea and vomiting today. Surgical history also includes laparoscopic gastric banding. EXAM: CT ABDOMEN AND PELVIS WITH CONTRAST TECHNIQUE: Multidetector CT imaging of the abdomen and pelvis was performed using the standard protocol following bolus administration of intravenous contrast. CONTRAST:  156m OMNIPAQUE IOHEXOL 300 MG/ML IV. Oral contrast was also administered. COMPARISON:  09/23/2014, 09/13/2014, 07/18/2014, 06/05/2014. FINDINGS: Lower chest:  Heart size normal.  Visualized lung bases clear. Hepatobiliary: Liver normal in size in appearance. Small gallstones within the gallbladder. No pericholecystic edema or inflammation. No biliary ductal dilation. Pancreas: Normal in appearance without evidence of  mass, ductal dilation, or inflammation. Spleen: Normal in size and appearance. Adrenals/Urinary Tract: Normal appearing adrenal glands. Nonobstructing approximate 3 mm calculus in a mid calix of the left kidney. Nonobstructing 1-2 mm calculus in a mid calix of the right kidney. No obstructing ureteral calculi on either side. No focal parenchymal abnormality involving either kidney. Urinary bladder with mild circumferential wall thickening which is unchanged and due to the fact that the bladder is decompressed. Stomach/Bowel: Gastric band unchanged in position or  orientation, shown on the scout image to lie in a 2:00 to 8:00 position. No evidence of hiatal hernia. Stomach normal in appearance filled with oral contrast and food. Normal-appearing small bowel. Diverticulosis involving the distal descending colon. Mild hyperemia and pericolonic edema involving the distal descending colon several cm proximal to the anastomosis. Colocolic anastomosis in the pelvis widely patent. No abnormal fluid collections. Normal appendix in the right mid abdomen. Vascular/Lymphatic: No pathologic lymphadenopathy. No visible aortoiliofemoral atherosclerosis. Widely patent visceral arteries. Reproductive: Prostate gland and seminal vesicles normal in size and appearance for age. Other: Multiple phleboliths low in both Parsons of the pelvis. Musculoskeletal: Degenerative disc disease and spondylosis involving the visualized lower thoracic spine. Hemangioma in the L3 vertebral body. Regional skeleton otherwise unremarkable. IMPRESSION: 1. Mild acute diverticulitis involving the distal descending colon proximal to the colocolic anastomosis. 2. Satisfactory appearance to the colocolic anastomosis without evidence of leak. 3. Nonobstructing bilateral renal calculi. 4. Gastric band unchanged in position and without complicating features. Electronically Signed   By: Evangeline Dakin M.D.   On: 11/28/2014 22:47    Review of Systems   Constitutional: Negative.   HENT: Negative.   Eyes: Negative.   Respiratory: Negative.   Cardiovascular: Negative.   Gastrointestinal: Positive for nausea, vomiting and abdominal pain. Negative for diarrhea and constipation.  Genitourinary: Negative.   Musculoskeletal: Negative.   Skin: Negative.   Neurological: Negative.   Psychiatric/Behavioral: Negative.     Blood pressure 187/116, pulse 86, temperature 97.5 F (36.4 C), temperature source Oral, resp. rate 22, SpO2 98 %. Physical Exam  Constitutional: He is oriented to person, place, and time. He appears well-developed and well-nourished. He appears distressed.  Lying on gurney screaming with pain, unable to lie still. Dry heaving, BP better 166/86  HR 66 sats on R/A 100%.  HENT:  Head: Normocephalic and atraumatic.  Nose: Nose normal.  Eyes: Conjunctivae and EOM are normal. Right eye exhibits no discharge. Left eye exhibits no discharge. No scleral icterus.  Neck: No JVD present. No tracheal deviation present. No thyromegaly present.  Cardiovascular: Normal rate, regular rhythm, normal heart sounds and intact distal pulses.  Exam reveals no gallop.   No murmur heard. Respiratory: Effort normal and breath sounds normal. No respiratory distress. He has no wheezes. He has no rales. He exhibits no tenderness.  GI: Soft. He exhibits no distension and no mass. There is tenderness (LLQ). There is no rebound and no guarding.  Genitourinary: Guaiac negative stool.  Musculoskeletal: He exhibits no edema or tenderness.  Lymphadenopathy:    He has no cervical adenopathy.  Neurological: He is alert and oriented to person, place, and time. No cranial nerve deficit.  Skin: Skin is warm and dry. No rash noted. He is not diaphoretic. No erythema. No pallor.  Psychiatric: He has a normal mood and affect. His behavior is normal. Judgment and thought content normal.     Assessment/Plan Recurrent diverticulitis with significant pain. S/p  laparoscopic sigmoid colectomy 11/05/14 Hx of tobacco use Hx of nephrolithiasis Prior lap band  Plan:  Admit, pain and nausea control, NPO, hydrate and CT scan tonight.   Barry Parsons 11/30/2014, 2:56 PM

## 2014-11-30 NOTE — Progress Notes (Signed)
Report received from ED. AOx4. Pt complaining of abdominal pain. Pt made comfortable and orientated to the unit. Plan of care continued.  No complaints from the pt at this time.  Anahi Belmar W Alireza Pollack, RN

## 2014-12-01 LAB — BASIC METABOLIC PANEL
ANION GAP: 7 (ref 5–15)
BUN: 9 mg/dL (ref 6–20)
CO2: 23 mmol/L (ref 22–32)
Calcium: 8.6 mg/dL — ABNORMAL LOW (ref 8.9–10.3)
Chloride: 109 mmol/L (ref 101–111)
Creatinine, Ser: 0.64 mg/dL (ref 0.61–1.24)
GFR calc Af Amer: >60 mL/min (ref >60)
GFR calc non Af Amer: >60 mL/min (ref >60)
GLUCOSE: 88 mg/dL (ref 65–99)
POTASSIUM: 3.5 mmol/L (ref 3.5–5.1)
Sodium: 139 mmol/L (ref 135–145)

## 2014-12-01 LAB — CBC
HEMATOCRIT: 37.7 % — AB (ref 39.0–52.0)
HEMOGLOBIN: 12.9 g/dL — AB (ref 13.0–17.0)
MCH: 31 pg (ref 26.0–34.0)
MCHC: 34.2 g/dL (ref 30.0–36.0)
MCV: 90.6 fL (ref 78.0–100.0)
Platelets: 203 10*3/uL (ref 150–400)
RBC: 4.16 MIL/uL — ABNORMAL LOW (ref 4.22–5.81)
RDW: 12.5 % (ref 11.5–15.5)
WBC: 8.1 10*3/uL (ref 4.0–10.5)

## 2014-12-01 NOTE — Progress Notes (Signed)
Utilization Review Completed.Barry Parsons T11/05/2014  

## 2014-12-01 NOTE — Progress Notes (Signed)
[employer], [employer] delivered [employer]'s wallet. Wallet was found in room 2 of the emergency department

## 2014-12-01 NOTE — Progress Notes (Signed)
Post-operative pain  Subjective: Patient admitted yesterday with LLQ pain, ~1 month s/p sigmoidectomy for diverticulitis.  Had one episode of pain overnight, denies any nausea, passing flatus  Objective: Vital signs in last 24 hours: Temp:  [97.5 F (36.4 C)-98.3 F (36.8 C)] 98.2 F (36.8 C) (11/05 0641) Pulse Rate:  [60-86] 61 (11/05 0641) Resp:  [16-22] 18 (11/05 0641) BP: (106-187)/(52-116) 123/66 mmHg (11/05 0641) SpO2:  [97 %-100 %] 100 % (11/05 0641) Weight:  [112.5 kg (248 lb 0.3 oz)] 112.5 kg (248 lb 0.3 oz) (11/05 0500) Last BM Date: 11/30/14  Intake/Output from previous day: 11/04 0701 - 11/05 0700 In: 1148.3 [I.V.:1148.3] Out: 0  Intake/Output this shift:    General appearance: alert and cooperative GI: normal findings: soft, mildly tender in suprapubic and LLQ to palpation, non-distended Incision/Wound: healing well  Lab Results:  Results for orders placed or performed during the hospital encounter of 11/30/14 (from the past 24 hour(s))  Lipase, blood     Status: None   Collection Time: 11/30/14 12:50 PM  Result Value Ref Range   Lipase 25 11 - 51 U/L  Comprehensive metabolic panel     Status: Abnormal   Collection Time: 11/30/14 12:50 PM  Result Value Ref Range   Sodium 138 135 - 145 mmol/L   Potassium 3.7 3.5 - 5.1 mmol/L   Chloride 104 101 - 111 mmol/L   CO2 22 22 - 32 mmol/L   Glucose, Bld 107 (H) 65 - 99 mg/dL   BUN 11 6 - 20 mg/dL   Creatinine, Ser 1.610.68 0.61 - 1.24 mg/dL   Calcium 9.7 8.9 - 09.610.3 mg/dL   Total Protein 7.8 6.5 - 8.1 g/dL   Albumin 4.9 3.5 - 5.0 g/dL   AST 28 15 - 41 U/L   ALT 20 17 - 63 U/L   Alkaline Phosphatase 66 38 - 126 U/L   Total Bilirubin 0.9 0.3 - 1.2 mg/dL   GFR calc non Af Amer >60 >60 mL/min   GFR calc Af Amer >60 >60 mL/min   Anion gap 12 5 - 15  CBC     Status: None   Collection Time: 11/30/14 12:50 PM  Result Value Ref Range   WBC 7.3 4.0 - 10.5 K/uL   RBC 4.95 4.22 - 5.81 MIL/uL   Hemoglobin 15.8 13.0 - 17.0  g/dL   HCT 04.545.1 40.939.0 - 81.152.0 %   MCV 91.1 78.0 - 100.0 fL   MCH 31.9 26.0 - 34.0 pg   MCHC 35.0 30.0 - 36.0 g/dL   RDW 91.412.6 78.211.5 - 95.615.5 %   Platelets 260 150 - 400 K/uL  Urinalysis, Routine w reflex microscopic (not [facility])     Status: Abnormal   Collection Time: 11/30/14  2:50 PM  Result Value Ref Range   Color, Urine YELLOW YELLOW   APPearance CLEAR CLEAR   Specific Gravity, Urine 1.013 1.005 - 1.030   pH 7.5 5.0 - 8.0   Glucose, UA NEGATIVE NEGATIVE mg/dL   Hgb urine dipstick NEGATIVE NEGATIVE   Bilirubin Urine NEGATIVE NEGATIVE   Ketones, ur 40 (A) NEGATIVE mg/dL   Protein, ur NEGATIVE NEGATIVE mg/dL   Urobilinogen, UA 0.2 0.0 - 1.0 mg/dL   Nitrite NEGATIVE NEGATIVE   Leukocytes, UA NEGATIVE NEGATIVE  Basic metabolic panel     Status: Abnormal   Collection Time: 12/01/14  5:31 AM  Result Value Ref Range   Sodium 139 135 - 145 mmol/L   Potassium 3.5 3.5 - 5.1  mmol/L   Chloride 109 101 - 111 mmol/L   CO2 23 22 - 32 mmol/L   Glucose, Bld 88 65 - 99 mg/dL   BUN 9 6 - 20 mg/dL   Creatinine, Ser 7.56 0.61 - 1.24 mg/dL   Calcium 8.6 (L) 8.9 - 10.3 mg/dL   GFR calc non Af Amer >60 >60 mL/min   GFR calc Af Amer >60 >60 mL/min   Anion gap 7 5 - 15  CBC     Status: Abnormal   Collection Time: 12/01/14  5:31 AM  Result Value Ref Range   WBC 8.1 4.0 - 10.5 K/uL   RBC 4.16 (L) 4.22 - 5.81 MIL/uL   Hemoglobin 12.9 (L) 13.0 - 17.0 g/dL   HCT 43.3 (L) 29.5 - 18.8 %   MCV 90.6 78.0 - 100.0 fL   MCH 31.0 26.0 - 34.0 pg   MCHC 34.2 30.0 - 36.0 g/dL   RDW 41.6 60.6 - 30.1 %   Platelets 203 150 - 400 K/uL     Studies/Results Radiology     MEDS, Scheduled . cefTRIAXone (ROCEPHIN)  IV  2 g Intravenous Q24H   And  . metronidazole  500 mg Intravenous Q8H  . enoxaparin (LOVENOX) injection  40 mg Subcutaneous Q24H  . Influenza vac split quadrivalent PF  0.5 mL Intramuscular Tomorrow-1000     Assessment: Post-operative pain CT appears to be more consistent with proctitis or  colitis.  I do not see any fat stranding concerning for diverticulitis.  WBC normal.  Could also have anastomotic stricture that could cause this type of pain  Plan: Will try some clears today and continue to rest his colon.   Cont Abx.  May need a colonoscopy as an outpatient if these symptoms continue   LOS: 1 day    Vanita Panda, MD [facility], [place] [phone]   12/01/2014 9:38 AM

## 2014-12-02 ENCOUNTER — Inpatient Hospital Stay (HOSPITAL_COMMUNITY): Payer: BLUE CROSS/BLUE SHIELD

## 2014-12-02 DIAGNOSIS — M7989 Other specified soft tissue disorders: Secondary | ICD-10-CM

## 2014-12-02 MED ORDER — PHENOL 1.4 % MT LIQD: 2.0000 | OROMUCOSAL | Status: DC | PRN

## 2014-12-02 MED ORDER — CIPROFLOXACIN IN D5W 400 MG/200ML IV SOLN
400.0000 mg | Freq: Two times a day (BID) | INTRAVENOUS | Status: DC
  Administered 2014-12-02 – 2014-12-03 (×2): 400 mg via INTRAVENOUS
  Filled 2014-12-02 (×3): qty 200

## 2014-12-02 MED ORDER — MAGIC MOUTHWASH
15.0000 mL | Freq: Four times a day (QID) | ORAL | Status: DC | PRN
  Filled 2014-12-02: qty 15

## 2014-12-02 MED ORDER — LACTATED RINGERS IV BOLUS (SEPSIS): 1000.0000 mL | Freq: Three times a day (TID) | INTRAVENOUS | Status: DC | PRN

## 2014-12-02 MED ORDER — LIP MEDEX EX OINT
1.0000 "application " | TOPICAL_OINTMENT | Freq: Two times a day (BID) | CUTANEOUS | Status: DC
  Administered 2014-12-02: 1 via TOPICAL
  Filled 2014-12-02: qty 7

## 2014-12-02 MED ORDER — PROMETHAZINE HCL 25 MG/ML IJ SOLN: 6.2500 mg | INTRAMUSCULAR | Status: DC | PRN

## 2014-12-02 MED ORDER — METOPROLOL TARTRATE 1 MG/ML IV SOLN: 5.0000 mg | Freq: Four times a day (QID) | INTRAVENOUS | Status: DC | PRN

## 2014-12-02 MED ORDER — ALUM & MAG HYDROXIDE-SIMETH 200-200-20 MG/5ML PO SUSP: 30.0000 mL | Freq: Four times a day (QID) | ORAL | Status: DC | PRN

## 2014-12-02 MED ORDER — CLINDAMYCIN PHOSPHATE 600 MG/50ML IV SOLN
600.0000 mg | Freq: Three times a day (TID) | INTRAVENOUS | Status: DC
  Administered 2014-12-02 (×2): 600 mg via INTRAVENOUS
  Filled 2014-12-02 (×4): qty 50

## 2014-12-02 MED ORDER — TRAMADOL HCL 50 MG PO TABS: 50.0000 mg | ORAL_TABLET | Freq: Four times a day (QID) | ORAL | Status: DC | PRN

## 2014-12-02 MED ORDER — ACETAMINOPHEN 500 MG PO TABS: 1000.0000 mg | ORAL_TABLET | Freq: Three times a day (TID) | ORAL | Status: DC

## 2014-12-02 MED ORDER — SACCHAROMYCES BOULARDII 250 MG PO CAPS
250.0000 mg | ORAL_CAPSULE | Freq: Two times a day (BID) | ORAL | Status: DC
Start: 2014-12-02 — End: 2014-12-03
  Administered 2014-12-02 (×2): 250 mg via ORAL
  Filled 2014-12-02 (×4): qty 1

## 2014-12-02 MED ORDER — METRONIDAZOLE IN NACL 5-0.79 MG/ML-% IV SOLN
500.0000 mg | Freq: Four times a day (QID) | INTRAVENOUS | Status: DC
  Administered 2014-12-02: 500 mg via INTRAVENOUS
  Filled 2014-12-02 (×3): qty 100

## 2014-12-02 MED ORDER — ACETAMINOPHEN 650 MG RE SUPP: 650.0000 mg | Freq: Four times a day (QID) | RECTAL | Status: DC | PRN

## 2014-12-02 MED ORDER — MENTHOL 3 MG MT LOZG: 1.0000 | LOZENGE | OROMUCOSAL | Status: DC | PRN

## 2014-12-02 MED ORDER — ACETAMINOPHEN 325 MG PO TABS: 325.0000 mg | ORAL_TABLET | Freq: Four times a day (QID) | ORAL | Status: DC | PRN

## 2014-12-02 MED ORDER — DEXTROSE 5 % IV SOLN
2.0000 g | INTRAVENOUS | Status: DC
  Filled 2014-12-02: qty 2

## 2014-12-02 NOTE — Progress Notes (Signed)
Initial Nutrition Assessment  DOCUMENTATION CODES:   Not applicable  INTERVENTION:  Boost Breeze po TID, each supplement provides 250 kcal and 9 grams of protein  Diet advancement per MD  NUTRITION DIAGNOSIS:   Inadequate oral intake related to nausea, vomiting as evidenced by per patient/family report, percent weight loss.  GOAL:   Patient will meet greater than or equal to 90% of their needs  MONITOR:   PO intake, I & O's, Labs, Supplement acceptance  REASON FOR ASSESSMENT:   Malnutrition Screening Tool    ASSESSMENT:   39 y/o male with recurrent diverticulitis, from May thru November 05, 2014, when he underwent Laparoscopic sigmoid colectomy by Dr. Johna SheriffHoxworth, he went home on 10/29/14. Marland Kitchen. He did well until last week when he came to the Ed with severe pain and was found to have another bout of diverticulitis proximal to the new colectomy anastomosis. He was sent home on Cipro and Flagyl. He said he did better for 24 hours, but now is back with 10/10 pain, writhing in bed with pain, intermittent dry heaves  Spoke with patient at bedside. Admitted to eating a banana and chobani greek yogurt with blueberry on the bottom before being admitted.  Says he can only go a few days without having flare ups. Constant Nausea/vomitting. He said this has contributed to his 44#/15% severe weight loss in 6 months.  Nutrition-Focused physical exam completed. Findings are no fat depletion, no muscle depletion, and no edema.  Will provide boost breeze while on clear liquids, provide ensure enlive per diet advancement.   Diet Order:  Diet clear liquid Room service appropriate?: Yes; Fluid consistency:: Thin  Skin:  Reviewed, no issues  Last BM:  12/01/2014  Height:   Ht Readings from Last 1 Encounters:  11/06/14 5\' 11"  (1.803 m)    Weight:   Wt Readings from Last 1 Encounters:  12/01/14 248 lb 0.3 oz (112.5 kg)    Ideal Body Weight:     BMI:  Body mass index is 34.61  kg/(m^2).  Estimated Nutritional Needs:   Kcal:  2300 - 2800 calories  Protein:  90 - 112 grams  Fluid:  >/= 2.3L  EDUCATION NEEDS:   No education needs identified at this time  Dionne AnoWilliam M. Tarvares Lant, MS, RD LDN After Hours/Weekend Pager [phone]

## 2014-12-02 NOTE — Progress Notes (Signed)
VASCULAR LAB PRELIMINARY  PRELIMINARY  PRELIMINARY  PRELIMINARY  Left upper extremity venous duplex completed.    Preliminary report:  There is no DVT or SVT noted in the left upper extremity.   Ivery Nanney, RVT 12/02/2014, 5:22 PM

## 2014-12-02 NOTE — Progress Notes (Signed)
Redness, itching and swelling noted to upper left arm.  Called MD.  See NO.

## 2014-12-02 NOTE — Progress Notes (Signed)
Post-operative pain  Subjective: Patient admitted with LLQ pain, ~1 month s/p sigmoidectomy for diverticulitis.  Pt had some cramping and nausea after trying some clears yesterday, had a small BM and this resolved  Objective: Vital signs in last 24 hours: Temp:  [97.5 F (36.4 C)-98.4 F (36.9 C)] 97.6 F (36.4 C) (11/06 0547) Pulse Rate:  [57-71] 71 (11/06 0547) Resp:  [16-19] 18 (11/06 0547) BP: (116-133)/(66-94) 133/94 mmHg (11/06 0547) SpO2:  [99 %-100 %] 100 % (11/06 0547) Last BM Date: 12/01/14  Intake/Output from previous day: 11/05 0701 - 11/06 0700 In: 960 [P.O.:960] Out: 2 [Urine:2] Intake/Output this shift:    General appearance: alert and cooperative GI: normal findings: soft, mildly tender in suprapubic and LLQ to palpation, non-distended Incision/Wound: healing well   Lab Results:  No results found for this or any previous visit (from the past 24 hour(s)).   Studies/Results Radiology     MEDS, Scheduled . cefTRIAXone (ROCEPHIN)  IV  2 g Intravenous Q24H   And  . metronidazole  500 mg Intravenous Q8H  . enoxaparin (LOVENOX) injection  40 mg Subcutaneous Q24H     Assessment: Post-operative pain CT appears to be more consistent with proctitis or colitis.  I do not see any fat stranding concerning for diverticulitis.  WBC normal though, so infection less likely.  Could have anastomotic stricture that is causing this type of pain  Plan: Will cont limited clears today as tolerated and rest his colon.   Cont Abx.  May need a colonoscopy as an outpatient if these symptoms continue   LOS: 2 days    Vanita PandaAlicia C Quaneshia Wareing, MD [facility] [place] [phone]   12/02/2014 9:01 AM

## 2014-12-03 LAB — CBC
HCT: 39.7 % (ref 39.0–52.0)
HEMOGLOBIN: 13.9 g/dL (ref 13.0–17.0)
MCH: 31.8 pg (ref 26.0–34.0)
MCHC: 35 g/dL (ref 30.0–36.0)
MCV: 90.8 fL (ref 78.0–100.0)
Platelets: 209 10*3/uL (ref 150–400)
RBC: 4.37 MIL/uL (ref 4.22–5.81)
RDW: 12.4 % (ref 11.5–15.5)
WBC: 6.2 10*3/uL (ref 4.0–10.5)

## 2014-12-03 NOTE — Discharge Instructions (Signed)
Low residue diet Take current antibiotic prescription until gone Call as needed for questions or concerns  [phone] No activity limitations

## 2014-12-03 NOTE — Progress Notes (Signed)
Patient ID: Barry Parsons, male   DOB: 08-02-1975, 39 y.o.   MRN: [id number]    Subjective: He has some intermittent discomfort and feels sore in suprapubic area but overall feels better daily.Having bowel movements and notices a small amount of blood in them. Tolerating diet. Yesterday noted swelling and itching in upper left arm. This is still present  Today but improved  Objective: Vital signs in last 24 hours: Temp:  [97.4 F (36.3 C)-98.6 F (37 C)] 97.5 F (36.4 C) (11/07 0641) Pulse Rate:  [68-73] 73 (11/07 0641) Resp:  [17-18] 17 (11/07 0641) BP: (131-132)/(74-82) 132/77 mmHg (11/07 0641) SpO2:  [100 %] 100 % (11/07 0641) Weight:  [103.329 kg (227 lb 12.8 oz)] 103.329 kg (227 lb 12.8 oz) (11/07 0641) Last BM Date: 12/03/14  Intake/Output from previous day: 11/06 0701 - 11/07 0700 In: 840 [P.O.:840] Out: -  Intake/Output this shift:    General appearance: alert, cooperative and no distress GI: minimal subjective suprapubic tenderness. No guarding. No distention.  Extremities: Papular rash upper left arm and possibly some minimal swelling  Lab Results:   Recent Labs  12/01/14 0531 12/03/14 0510  WBC 8.1 6.2  HGB 12.9* 13.9  HCT 37.7* 39.7  PLT 203 209   BMET  Recent Labs  11/30/14 1250 12/01/14 0531  NA 138 139  K 3.7 3.5  CL 104 109  CO2 22 23  GLUCOSE 107* 88  BUN 11 9  CREATININE 0.68 0.64  CALCIUM 9.7 8.6*     Studies/Results: No results found.  Anti-infectives: Anti-infectives    Start     Dose/Rate Route Frequency Ordered Stop   12/02/14 1600  cefTRIAXone (ROCEPHIN) 2 g in dextrose 5 % 50 mL IVPB  Status:  Discontinued    Comments:  Pharmacy may adjust dosing strength / duration / interval for maximal efficacy   2 g 100 mL/hr over 30 Minutes Intravenous Every 24 hours 12/02/14 1041 12/02/14 1512   12/02/14 1600  ciprofloxacin (CIPRO) IVPB 400 mg     400 mg 200 mL/hr over 60 Minutes Intravenous Every 12 hours 12/02/14 1524     12/02/14  1600  clindamycin (CLEOCIN) IVPB 600 mg     600 mg 100 mL/hr over 30 Minutes Intravenous 3 times per day 12/02/14 1524     12/02/14 1045  metroNIDAZOLE (FLAGYL) IVPB 500 mg  Status:  Discontinued     500 mg 100 mL/hr over 60 Minutes Intravenous Every 6 hours 12/02/14 1041 12/02/14 1512   11/30/14 1530  cefTRIAXone (ROCEPHIN) 2 g in dextrose 5 % 50 mL IVPB  Status:  Discontinued     2 g 100 mL/hr over 30 Minutes Intravenous Every 24 hours 11/30/14 1526 12/02/14 1036   11/30/14 1530  metroNIDAZOLE (FLAGYL) IVPB 500 mg  Status:  Discontinued     500 mg 100 mL/hr over 60 Minutes Intravenous Every 8 hours 11/30/14 1526 12/02/14 1036      Assessment/Plan: Overall improved. No evidence of infection with normal white count and afebrile.Minimal tenderness. Source of episode not entirely clear.  Question recurrent diverticulitis versus inflammatory change around recent anastomosis. I think okay for discharge and complete oral antibiotics for one week    LOS: 3 days    Shareece Bultman T 12/03/2014

## 2014-12-03 NOTE — Discharge Summary (Signed)
Patient ID: Vinnie LevelKevin Cannaday [id number] 39 y.o. January 01, 1976  Date of admission:   11/30/2014  Discharge date and time: 12/03/2014   Admitting Physician: Glenna FellowsHOXWORTH,Bengie Kaucher T  Discharge Physician: Glenna FellowsHOXWORTH,Euriah Matlack T  Admission Diagnoses: postoperative abdominal pain  Discharge Diagnoses: same  Operations: none  Admission Condition: fair  Discharged Condition: good  Indication for Admission: patient is a 39 year old male who approximately one month following laparoscopic sigmoid colectomy for presumed recurrent diverticulitis. The patient had a history of recurrent suprapubic and left lower quadrant abdominal pain with several normal CT scans but one CT scan showing some evidence of inflammatory change, mild, in an area sigmoid diverticulitis. Colonoscopy also showed some evidence of colitis consistent with diverticulitis. His postoperative course was unremarkable. However 2 days prior to this admission he developed what he describes as acute severe suprapubic and left lower quadrant pain pretty much identical to what he had experienced preoperatively. He was seen in the emergency department with findings of an area of mild inflammation just proximal to the anastomosis in an area of diverticulosis felt to be most consistent with diverticulitis. He was treated symptomatically and placed on oral Cipro and Flagyl. He states he did better for about 2 days but then had recurrent severe pain and presented to the emergency department again on the day of this admission. CT scan was repeated showing significant improvement of mild inflammatory change just above and appendectomy anastomosis. His white count at this point was normal at admission and had been mildly elevated 2 days previously. However due to severe symptoms he was admitted for symptom control and treatment.    Hospital Course: Started on broad-spectrum IV antibiotics. Later in the day his pain was much improved and had minimal tenderness.  Over the next 2 days his symptoms improved. He continued to have some intermittent left lower quadrant pain but nothing as severe. White count remained normal. He is afebrile with normal vital signs. He had minimal tenderness. On the day of discharge he still has some mild discomfort but is clearly feeling better. He has had a few loose stools with some slight blood noted subjectively. Abdomen is very minimally tender without guarding. He is felt stable for discharge at this time and is discharged home to continue oral Cipro and Flagyl 1 week and on a low residue diet.  Consults: None  Significant Diagnostic Studies: CT scan as above  Treatments: IV hydration and IV antibiotics  Disposition: Home  Patient Instructions:    Medication List    TAKE these medications        ciprofloxacin 500 MG tablet  Commonly known as:  CIPRO  Take 1 tablet (500 mg total) by mouth every 12 (twelve) hours.     metroNIDAZOLE 500 MG tablet  Commonly known as:  FLAGYL  Take 1 tablet (500 mg total) by mouth 3 (three) times daily.     ondansetron 8 MG disintegrating tablet  Commonly known as:  ZOFRAN ODT  Take 1 tablet (8 mg total) by mouth every 8 (eight) hours as needed for nausea or vomiting.     Oxycodone HCl 10 MG Tabs  Take 1 tablet (10 mg total) by mouth every 4 (four) hours as needed for moderate pain.     oxyCODONE-acetaminophen 5-325 MG tablet  Commonly known as:  PERCOCET/ROXICET  Take 1-2 tablets by mouth every 4 (four) hours as needed for severe pain.     promethazine 25 MG tablet  Commonly known as:  PHENERGAN  Take 1 tablet (25 mg total)  by mouth every 6 (six) hours as needed for nausea or vomiting.     traMADol 50 MG tablet  Commonly known as:  ULTRAM  Take 50 mg by mouth every 8 (eight) hours as needed for moderate pain or severe pain (pain).        Activity: activity as tolerated Diet: low residue Wound Care: none needed  Follow-up:  With Dr. Johna Sheriff in 3  weeks.  Signed: Mariella Saa MD, FACS  12/03/2014, 12:05 PM

## 2015-01-13 ENCOUNTER — Emergency Department (HOSPITAL_COMMUNITY): Payer: BLUE CROSS/BLUE SHIELD

## 2015-01-13 ENCOUNTER — Emergency Department (HOSPITAL_COMMUNITY)
Admission: EM | Admit: 2015-01-13 | Discharge: 2015-01-13 | Disposition: A | Discharge status: Home / Self Care | Payer: BLUE CROSS/BLUE SHIELD | Attending: Emergency Medicine | Admitting: Emergency Medicine

## 2015-01-13 DIAGNOSIS — Z8719 Personal history of other diseases of the digestive system: Secondary | ICD-10-CM | POA: Insufficient documentation

## 2015-01-13 DIAGNOSIS — R1032 Left lower quadrant pain: Secondary | ICD-10-CM | POA: Diagnosis present

## 2015-01-13 DIAGNOSIS — R630 Anorexia: Secondary | ICD-10-CM | POA: Insufficient documentation

## 2015-01-13 DIAGNOSIS — R197 Diarrhea, unspecified: Secondary | ICD-10-CM | POA: Diagnosis not present

## 2015-01-13 DIAGNOSIS — Z87891 Personal history of nicotine dependence: Secondary | ICD-10-CM | POA: Diagnosis not present

## 2015-01-13 DIAGNOSIS — R143 Flatulence: Secondary | ICD-10-CM | POA: Diagnosis not present

## 2015-01-13 DIAGNOSIS — Z87442 Personal history of urinary calculi: Secondary | ICD-10-CM | POA: Diagnosis not present

## 2015-01-13 DIAGNOSIS — Z9884 Bariatric surgery status: Secondary | ICD-10-CM | POA: Insufficient documentation

## 2015-01-13 DIAGNOSIS — M1990 Unspecified osteoarthritis, unspecified site: Secondary | ICD-10-CM | POA: Insufficient documentation

## 2015-01-13 DIAGNOSIS — R112 Nausea with vomiting, unspecified: Secondary | ICD-10-CM | POA: Diagnosis not present

## 2015-01-13 LAB — URINALYSIS, ROUTINE W REFLEX MICROSCOPIC
BILIRUBIN URINE: NEGATIVE
Glucose, UA: 100 mg/dL — AB
HGB URINE DIPSTICK: NEGATIVE
KETONES UR: 40 mg/dL — AB
Leukocytes, UA: NEGATIVE
NITRITE: NEGATIVE
PROTEIN: NEGATIVE mg/dL
Specific Gravity, Urine: 1.024 (ref 1.005–1.030)
pH: 7.5 (ref 5.0–8.0)

## 2015-01-13 LAB — COMPREHENSIVE METABOLIC PANEL
ALK PHOS: 75 U/L (ref 38–126)
ALT: 23 U/L (ref 17–63)
ANION GAP: 14 (ref 5–15)
AST: 24 U/L (ref 15–41)
Albumin: 4.8 g/dL (ref 3.5–5.0)
BILIRUBIN TOTAL: 0.8 mg/dL (ref 0.3–1.2)
BUN: 9 mg/dL (ref 6–20)
CALCIUM: 9.6 mg/dL (ref 8.9–10.3)
CO2: 21 mmol/L — AB (ref 22–32)
CREATININE: 0.71 mg/dL (ref 0.61–1.24)
Chloride: 107 mmol/L (ref 101–111)
GFR calc Af Amer: >60 mL/min (ref >60)
GFR calc non Af Amer: >60 mL/min (ref >60)
GLUCOSE: 160 mg/dL — AB (ref 65–99)
Potassium: 3.8 mmol/L (ref 3.5–5.1)
SODIUM: 142 mmol/L (ref 135–145)
TOTAL PROTEIN: 7.8 g/dL (ref 6.5–8.1)

## 2015-01-13 LAB — LIPASE, BLOOD: Lipase: 19 U/L (ref 11–51)

## 2015-01-13 LAB — CBC
HCT: 44.6 % (ref 39.0–52.0)
HEMOGLOBIN: 15.9 g/dL (ref 13.0–17.0)
MCH: 32.4 pg (ref 26.0–34.0)
MCHC: 35.7 g/dL (ref 30.0–36.0)
MCV: 91 fL (ref 78.0–100.0)
PLATELETS: 260 10*3/uL (ref 150–400)
RBC: 4.9 MIL/uL (ref 4.22–5.81)
RDW: 12.4 % (ref 11.5–15.5)
WBC: 11.1 10*3/uL — ABNORMAL HIGH (ref 4.0–10.5)

## 2015-01-13 LAB — I-STAT CG4 LACTIC ACID, ED: LACTIC ACID, VENOUS: 2.31 mmol/L — AB (ref 0.5–2.0)

## 2015-01-13 MED ORDER — HYDROMORPHONE HCL 1 MG/ML IJ SOLN
1.0000 mg | Freq: Once | INTRAMUSCULAR | Status: AC
  Administered 2015-01-13: 1 mg via INTRAVENOUS
  Filled 2015-01-13: qty 1

## 2015-01-13 MED ORDER — ONDANSETRON HCL 4 MG/2ML IJ SOLN
4.0000 mg | Freq: Once | INTRAMUSCULAR | Status: AC | PRN
  Administered 2015-01-13: 4 mg via INTRAVENOUS
  Filled 2015-01-13: qty 2

## 2015-01-13 MED ORDER — SODIUM CHLORIDE 0.9 % IV BOLUS (SEPSIS)
1000.0000 mL | Freq: Once | INTRAVENOUS | Status: AC
  Administered 2015-01-13: 1000 mL via INTRAVENOUS

## 2015-01-13 MED ORDER — HYDROMORPHONE HCL 1 MG/ML IJ SOLN
1.0000 mg | Freq: Once | INTRAMUSCULAR | Status: AC
Start: 2015-01-13 — End: 2015-01-13
  Administered 2015-01-13: 1 mg via INTRAVENOUS
  Filled 2015-01-13: qty 1

## 2015-01-13 MED ORDER — ONDANSETRON 4 MG PO TBDP: 4.0000 mg | ORAL_TABLET | Freq: Once | ORAL | Status: DC | PRN

## 2015-01-13 MED ORDER — ONDANSETRON HCL 4 MG/2ML IJ SOLN
4.0000 mg | Freq: Once | INTRAMUSCULAR | Status: AC
  Administered 2015-01-13: 4 mg via INTRAVENOUS
  Filled 2015-01-13: qty 2

## 2015-01-13 MED ORDER — IOHEXOL 300 MG/ML  SOLN
25.0000 mL | Freq: Once | INTRAMUSCULAR | Status: AC | PRN
  Administered 2015-01-13: 25 mL via ORAL

## 2015-01-13 MED ORDER — PROMETHAZINE HCL 25 MG/ML IJ SOLN
25.0000 mg | Freq: Once | INTRAMUSCULAR | Status: AC
  Administered 2015-01-13: 25 mg via INTRAVENOUS
  Filled 2015-01-13: qty 1

## 2015-01-13 MED ORDER — IOHEXOL 300 MG/ML  SOLN
100.0000 mL | Freq: Once | INTRAMUSCULAR | Status: AC | PRN
  Administered 2015-01-13: 100 mL via INTRAVENOUS

## 2015-01-13 NOTE — ED Notes (Signed)
RN and EDP made aware of critical istat result. 

## 2015-01-13 NOTE — ED Provider Notes (Signed)
CSN: [id number]     Arrival date & time 01/13/15  1702 History   First MD Initiated Contact with Patient 01/13/15 1716     Chief Complaint  Patient presents with  . Abdominal Pain  . Emesis     (Consider location/radiation/quality/duration/timing/severity/associated sxs/prior Treatment) Patient is a 39 y.o. male presenting with abdominal pain and vomiting. The history is provided by the patient.  Abdominal Pain Pain location:  LLQ Pain quality: sharp, shooting and stabbing   Pain quality comment:  Patient states he did have mild pain on Wednesday. She took some of his pain medication and started eating a bland diet and symptoms had resolved by Friday when symptoms recurred this morning at 9 AM Pain radiates to:  Does not radiate Pain severity:  Severe Onset quality:  Sudden Duration:  8 hours Timing:  Constant Progression:  Worsening Chronicity:  Recurrent Context: awakening from sleep   Context: not sick contacts   Relieved by:  Nothing Worsened by:  Eating and movement Ineffective treatments: pain medication. Associated symptoms: anorexia, belching, diarrhea, flatus, nausea and vomiting   Associated symptoms: no cough, no dysuria, no fever and no melena   Risk factors: no alcohol abuse   Risk factors comment:  Hx of colon resection for diverticulitis in October 2016 with recurrent diverticulitis in November Emesis Associated symptoms: abdominal pain and diarrhea     Past Medical History  Diagnosis Date  . Arthritis   . Kidney stones   . Diverticulitis    Past Surgical History  Procedure Laterality Date  . Vasectomy  2009  . 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 Tonsillectomy    . Laparoscopic sigmoid colectomy N/A 11/05/2014    Procedure: LAPAROSCOPIC SIGMOID COLECTOMY;  Surgeon: [name], MD;  Location: [facility];  Service: General;  Laterality: N/A;   Family History  Problem Relation Age of Onset  . Heart disease Mother   . Kidney Stones    .  Gallstones     Social History  Substance Use Topics  . Smoking status: Former Smoker -- 0.25 packs/day    Types: Cigarettes    Quit date: 01/26/2013  . Smokeless tobacco: Never Used  . Alcohol Use: Yes     Comment: occasional / NONE SINCE JUNE    Review of Systems  Constitutional: Negative for fever.  Respiratory: Negative for cough.   Gastrointestinal: Positive for nausea, vomiting, abdominal pain, diarrhea, anorexia and flatus. Negative for melena.  Genitourinary: Negative for dysuria.  All other systems reviewed and are negative.     Allergies  Review of patient's allergies indicates no known allergies.  Home Medications   Prior to Admission medications   Medication Sig Start Date End Date Taking? Authorizing Provider  ciprofloxacin (CIPRO) 500 MG tablet Take 1 tablet (500 mg total) by mouth every 12 (twelve) hours. Patient not taking: Reported on 01/13/2015 11/29/14   Raeford Razor, MD  metroNIDAZOLE (FLAGYL) 500 MG tablet Take 1 tablet (500 mg total) by mouth 3 (three) times daily. Patient not taking: Reported on 01/13/2015 11/29/14   Raeford Razor, MD  ondansetron (ZOFRAN ODT) 8 MG disintegrating tablet Take 1 tablet (8 mg total) by mouth every 8 (eight) hours as needed for nausea or vomiting. 10/26/14   Jaynie Crumble, PA-C  oxyCODONE 10 MG TABS Take 1 tablet (10 mg total) by mouth every 4 (four) hours as needed for moderate pain. Patient not taking: Reported on 11/30/2014 11/08/14   [name], MD  oxyCODONE-acetaminophen (PERCOCET/ROXICET) 5-325 MG tablet Take  1-2 tablets by mouth every 4 (four) hours as needed for severe pain. Patient not taking: Reported on 11/30/2014 11/29/14   Raeford Razor, MD  promethazine (PHENERGAN) 25 MG tablet Take 1 tablet (25 mg total) by mouth every 6 (six) hours as needed for nausea or vomiting. Patient not taking: Reported on 11/30/2014 11/29/14   Raeford Razor, MD  traMADol (ULTRAM) 50 MG tablet Take 50 mg by mouth every 8 (eight)  hours as needed for moderate pain or severe pain (pain).  07/03/10   Historical Provider, MD   BP 167/106 mmHg  Pulse 83  Temp(Src) 98.8 F (37.1 C) (Oral)  Resp 21  Wt 235 lb (106.595 kg)  SpO2 100% Physical Exam  Constitutional: He is oriented to person, place, and time. He appears well-developed and well-nourished. He appears distressed.  Appears uncomfortable  HENT:  Head: Normocephalic and atraumatic.  Mouth/Throat: Oropharynx is clear and moist. Mucous membranes are dry.  Eyes: Conjunctivae and EOM are normal. Pupils are equal, round, and reactive to light.  Neck: Normal range of motion. Neck supple.  Cardiovascular: Normal rate, regular rhythm and intact distal pulses.   No murmur heard. Pulmonary/Chest: Effort normal and breath sounds normal. No respiratory distress. He has no wheezes. He has no rales.  Abdominal: Soft. Bowel sounds are normal. He exhibits no distension. There is tenderness in the left lower quadrant. There is guarding. There is no rebound and no CVA tenderness.  Musculoskeletal: Normal range of motion. He exhibits no edema or tenderness.  Neurological: He is alert and oriented to person, place, and time.  Skin: Skin is warm and dry. No rash noted. No erythema.  Psychiatric: He has a normal mood and affect. His behavior is normal.  Nursing note and vitals reviewed.   ED Course  Procedures (including critical care time) Labs Review Labs Reviewed  COMPREHENSIVE METABOLIC PANEL - Abnormal; Notable for the following:    CO2 21 (*)    Glucose, Bld 160 (*)    All other components within normal limits  CBC - Abnormal; Notable for the following:    WBC 11.1 (*)    All other components within normal limits  URINALYSIS, ROUTINE W REFLEX MICROSCOPIC (NOT AT [facility]) - Abnormal; Notable for the following:    Glucose, UA 100 (*)    Ketones, ur 40 (*)    All other components within normal limits  I-STAT CG4 LACTIC ACID, ED - Abnormal; Notable for the following:     Lactic Acid, Venous 2.31 (*)    All other components within normal limits  LIPASE, BLOOD    Imaging Review Ct Abdomen Pelvis W Contrast  01/13/2015  CLINICAL DATA:  Left lower quadrant pain history of colon surgery. EXAM: CT ABDOMEN AND PELVIS WITH CONTRAST TECHNIQUE: Multidetector CT imaging of the abdomen and pelvis was performed using the standard protocol following bolus administration of intravenous contrast. CONTRAST:  25mL OMNIPAQUE IOHEXOL 300 MG/ML SOLN, OMNIPAQUE IOHEXOL 300 MG/ML SOLN COMPARISON:  11/30/2014 FINDINGS: Lower chest: No pleural fluid identified. The lung bases appear clear. Hepatobiliary: No focal liver abnormality identified. Stuck scratch set small stones within the liver are noted which measure up to 4 mm. No gallbladder wall thickening and no pericholecystic fluid. Negative sonographic Murphy's sign. No biliary dilatation identified. Pancreas: Negative. Spleen: Normal appearance of the spleen. Adrenals/Urinary Tract: The adrenal glands are normal. Small bilateral renal calculi noted. The largest is in the left mid kidney measuring 4 mm. The urinary bladder appears normal. Stomach/Bowel: Gastric band  is identified. The remaining portions of the stomach and small bowel loops are unremarkable. The appendix is visualized in the right lower quadrant and appears normal. There is no pathologic dilatation of the colon. Postoperative changes involving the sigmoid colon are identified. No complications. Vascular/Lymphatic: Normal appearance of the abdominal aorta. No enlarged retroperitoneal or mesenteric adenopathy. No enlarged pelvic or inguinal lymph nodes. Reproductive: The prostate gland and seminal vesicles are normal peer Other: There is no ascites or focal fluid collections within the abdomen or pelvis. Musculoskeletal: L3 vertebral hemangioma noted. IMPRESSION: 1. No acute findings identified within the abdomen or pelvis. 2. Status post gastric banding.  No complication  identified. 3. Status post resection of sigmoid colon. No complications identified at the anastomotic site. Electronically Signed   By: Signa Kellaylor  Stroud M.D.   On: 01/13/2015 19:55   I have personally reviewed and evaluated these images and lab results as part of my medical decision-making.   EKG Interpretation None      MDM   Final diagnoses:  Left lower quadrant pain    Patient is a 39 year old male with a history of diverticulitis complicated by recurrent episodes requiring bowel resection in October who has had one episode of recurrent diverticulitis in November and presents today with recurrent left lower quadrant pain, vomiting and diarrhea. Patient states the pain is severe and not controlled by his oxycodone tens which he takes at home.  Patient appears uncomfortable currently with significant left lower quadrant pain.   Patient does not describe symptoms concerning for bowel obstruction and no prior history of obstruction. He does have bowel sounds on exam. He did drink 3 alcoholic beverages yesterday but does not have significant left upper quadrant pain consistent with pancreatitis. He has no right-sided abdominal pain concerning for cholecystitis or appendicitis.  CBC, CMP, lipase, lactic acid pending for further evaluation.  6:07 PM CBC significant for mild leukocytosis of 11,000. Lactic acid elevated to 2.3. Spoke with Dr. Ezzard StandingNewman with surgery who recommended doing a repeat CT to further evaluate for perforation or abscess.  8:29 PM Patient CMP was within normal limits. CT of the abdomen and pelvis negative for acute findings. Patient is still having significant abdominal pain and vomiting. Patient given another dose of medication. If he is unable to tolerate by mouth's will need to admit for abdominal pain and vomiting of unknown origin.  Gwyneth SproutWhitney Elsey Holts, MD 01/13/15 2232

## 2015-01-13 NOTE — Discharge Instructions (Signed)

## 2015-01-13 NOTE — ED Notes (Addendum)
Pt c/o constant, intense, LLQ abdominal pain where he previously had colon surgery with n/v since 0900. Also some diarrhea earlier. Pt. Reports not being able to hold fluids today. Pt reports no blood in vomit.

## 2015-01-13 NOTE — ED Notes (Signed)
Pt reports that nausea is not gone, but it is better.

## 2015-01-13 NOTE — ED Notes (Signed)
Pt reports improvement in nausea.

## 2015-01-13 NOTE — ED Notes (Signed)
Tube station in ED down, RN notified Pharmacy and asked them to redirect phenergan to TCU station.

## 2015-01-14 ENCOUNTER — Other Ambulatory Visit: Payer: Self-pay | Admitting: General Surgery

## 2015-01-14 DIAGNOSIS — Z9049 Acquired absence of other specified parts of digestive tract: Secondary | ICD-10-CM

## 2015-02-04 ENCOUNTER — Emergency Department (HOSPITAL_COMMUNITY)
Admission: EM | Admit: 2015-02-04 | Discharge: 2015-02-04 | Disposition: A | Discharge status: Home / Self Care | Payer: BLUE CROSS/BLUE SHIELD | Attending: Emergency Medicine | Admitting: Emergency Medicine

## 2015-02-04 ENCOUNTER — Encounter (HOSPITAL_COMMUNITY): Payer: Self-pay | Admitting: Emergency Medicine

## 2015-02-04 ENCOUNTER — Emergency Department (HOSPITAL_COMMUNITY): Payer: BLUE CROSS/BLUE SHIELD

## 2015-02-04 DIAGNOSIS — M1990 Unspecified osteoarthritis, unspecified site: Secondary | ICD-10-CM | POA: Insufficient documentation

## 2015-02-04 DIAGNOSIS — R1032 Left lower quadrant pain: Secondary | ICD-10-CM | POA: Diagnosis not present

## 2015-02-04 DIAGNOSIS — Z8719 Personal history of other diseases of the digestive system: Secondary | ICD-10-CM | POA: Diagnosis not present

## 2015-02-04 DIAGNOSIS — R739 Hyperglycemia, unspecified: Secondary | ICD-10-CM | POA: Insufficient documentation

## 2015-02-04 DIAGNOSIS — Z87442 Personal history of urinary calculi: Secondary | ICD-10-CM | POA: Diagnosis not present

## 2015-02-04 DIAGNOSIS — Z87891 Personal history of nicotine dependence: Secondary | ICD-10-CM | POA: Diagnosis not present

## 2015-02-04 DIAGNOSIS — R1110 Vomiting, unspecified: Secondary | ICD-10-CM | POA: Insufficient documentation

## 2015-02-04 DIAGNOSIS — G8929 Other chronic pain: Secondary | ICD-10-CM | POA: Insufficient documentation

## 2015-02-04 DIAGNOSIS — R197 Diarrhea, unspecified: Secondary | ICD-10-CM | POA: Insufficient documentation

## 2015-02-04 LAB — COMPREHENSIVE METABOLIC PANEL
ALT: 16 U/L — AB (ref 17–63)
AST: 21 U/L (ref 15–41)
Albumin: 4.8 g/dL (ref 3.5–5.0)
Alkaline Phosphatase: 65 U/L (ref 38–126)
Anion gap: 11 (ref 5–15)
BILIRUBIN TOTAL: 0.5 mg/dL (ref 0.3–1.2)
BUN: 8 mg/dL (ref 6–20)
CO2: 22 mmol/L (ref 22–32)
CREATININE: 0.72 mg/dL (ref 0.61–1.24)
Calcium: 9.6 mg/dL (ref 8.9–10.3)
Chloride: 109 mmol/L (ref 101–111)
GFR calc Af Amer: >60 mL/min (ref >60)
Glucose, Bld: 150 mg/dL — ABNORMAL HIGH (ref 65–99)
POTASSIUM: 4.1 mmol/L (ref 3.5–5.1)
Sodium: 142 mmol/L (ref 135–145)
TOTAL PROTEIN: 7.8 g/dL (ref 6.5–8.1)

## 2015-02-04 LAB — CBC
HCT: 44.5 % (ref 39.0–52.0)
Hemoglobin: 15.4 g/dL (ref 13.0–17.0)
MCH: 32 pg (ref 26.0–34.0)
MCHC: 34.6 g/dL (ref 30.0–36.0)
MCV: 92.5 fL (ref 78.0–100.0)
PLATELETS: 229 10*3/uL (ref 150–400)
RBC: 4.81 MIL/uL (ref 4.22–5.81)
RDW: 12.2 % (ref 11.5–15.5)
WBC: 7.5 10*3/uL (ref 4.0–10.5)

## 2015-02-04 LAB — URINALYSIS, ROUTINE W REFLEX MICROSCOPIC
Bilirubin Urine: NEGATIVE
Glucose, UA: 100 mg/dL — AB
Hgb urine dipstick: NEGATIVE
KETONES UR: 40 mg/dL — AB
LEUKOCYTES UA: NEGATIVE
NITRITE: NEGATIVE
PROTEIN: NEGATIVE mg/dL
Specific Gravity, Urine: 1.018 (ref 1.005–1.030)
pH: 7.5 (ref 5.0–8.0)

## 2015-02-04 LAB — LIPASE, BLOOD: Lipase: 19 U/L (ref 11–51)

## 2015-02-04 MED ORDER — PROMETHAZINE HCL 25 MG/ML IJ SOLN
25.0000 mg | Freq: Once | INTRAMUSCULAR | Status: AC
  Administered 2015-02-04: 25 mg via INTRAVENOUS
  Filled 2015-02-04: qty 1

## 2015-02-04 MED ORDER — ONDANSETRON HCL 4 MG/2ML IJ SOLN
4.0000 mg | Freq: Once | INTRAMUSCULAR | Status: AC
  Administered 2015-02-04: 4 mg via INTRAVENOUS
  Filled 2015-02-04: qty 2

## 2015-02-04 MED ORDER — HYDROMORPHONE HCL 1 MG/ML IJ SOLN
1.0000 mg | Freq: Once | INTRAMUSCULAR | Status: AC
  Administered 2015-02-04: 1 mg via INTRAVENOUS
  Filled 2015-02-04: qty 1

## 2015-02-04 MED ORDER — MORPHINE SULFATE (PF) 4 MG/ML IV SOLN
4.0000 mg | Freq: Once | INTRAVENOUS | Status: AC
  Administered 2015-02-04: 4 mg via INTRAVENOUS
  Filled 2015-02-04: qty 1

## 2015-02-04 MED ORDER — METOCLOPRAMIDE HCL 5 MG/ML IJ SOLN
10.0000 mg | Freq: Once | INTRAMUSCULAR | Status: AC
  Administered 2015-02-04: 10 mg via INTRAVENOUS
  Filled 2015-02-04: qty 2

## 2015-02-04 MED ORDER — SODIUM CHLORIDE 0.9 % IV BOLUS (SEPSIS)
2000.0000 mL | Freq: Once | INTRAVENOUS | Status: AC
Start: 2015-02-04 — End: 2015-02-04
  Administered 2015-02-04: 2000 mL via INTRAVENOUS

## 2015-02-04 NOTE — ED Notes (Signed)
Patient states "The only thing that helps is IV pain meds and nausea medication".

## 2015-02-04 NOTE — ED Notes (Signed)
Patient presents with LLQ abdominal pain, 6 episodes of emesis and diarrhea, history of diverticulitis, reports feels the same. Rates pain 10/10.

## 2015-02-04 NOTE — ED Provider Notes (Signed)
Signed out from Dr. Ethelda ChickJacubowitz pending evaluation by Dr. Johna SheriffHoxworth. Dr. Johna SheriffHoxworth states the patient can be discharged home after pain and nausea medications. Given doses of IV Dilaudid and Phenergan in the emergency department. Reevaluation shows the patient is resting comfortably. Abdomen is soft and nontender. No further vomiting in the emergency department. Patient is tolerating oral fluid. [facility] pain medication and [facility]. Patient is to follow-up with him. He understands this and the need to return for any worsening symptoms.  Loren Raceravid Levell Tavano, MD 02/04/15 [phone]

## 2015-02-04 NOTE — Consult Note (Signed)
Emergency department visit note  Barry Parsons is an 40 y.o. male.   HPI: Patient well known to me status post laparoscopic sigmoid colectomy 11/05/2014 for what appeared to be recurrent episodes of sigmoid diverticulitis. He has had recurring episodes of left lower quadrant abdominal pain and colonoscopy and CT scan that was suggestive of recurrent sigmoid colon diverticulitis although did not have severe findings. Hepatopetal surgery he did have an area of thickening and adhesions in the mid sigmoid colon that was resected. His postoperative course was unremarkable but he was admitted several weeks later with recurrent identical pain. CT scan at that time showed some inflammatory change around the anastomosis but no actual complication at this could certainly have been a normal postoperative finding at that. He improved but has had to recurrent episodes of left lower quadrant pain. One was mid December 20 presented to the emergency department and at that time workup including lab work and CT scan of the abdomen and pelvis was entirely negative. He was treated as an outpatient and approved. I saw him in the office after this. He was doing better although having some occasional mild discomfort. He now presents back to the emergency department today began sudden onset of what he describes as severe left lower quadrant pain with some diarrhea and diaphoresis this morning that persisted. He became nauseated and vomited as he usually does. No definite fever or chills. No melena or hematochezia. He states the area feels "swollen to him". These episodes are really identical to what he was having prior to his surgery.  Past Medical History  Diagnosis Date  . Arthritis   . Kidney stones   . Diverticulitis     Past Surgical History  Procedure Laterality Date  . Vasectomy  2009  . 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 Tonsillectomy    . Laparoscopic sigmoid colectomy N/A 11/05/2014    Procedure:  LAPAROSCOPIC SIGMOID COLECTOMY;  Surgeon: Excell Seltzer, MD;  Location: [facility];  Service: General;  Laterality: N/A;    Family History  Problem Relation Age of Onset  . Heart disease Mother   . Kidney Stones    . Gallstones      Social History:  reports that he quit smoking about 2 years ago. His smoking use included Cigarettes. He smoked 0.25 packs per day. He has never used smokeless tobacco. He reports that he drinks alcohol. He reports that he does not use illicit drugs.  Allergies: No Known Allergies  Medications:  Current Facility-Administered Medications  Medication Dose Route Frequency Provider Last Rate Last Dose  . HYDROmorphone (DILAUDID) injection 1 mg  1 mg Intravenous Once Julianne Rice, MD      . promethazine [facility]) injection 25 mg  25 mg Intravenous Once Julianne Rice, MD       Current Outpatient Prescriptions  Medication Sig Dispense Refill  . oxyCODONE-acetaminophen (PERCOCET/ROXICET) 5-325 MG tablet Take 1-2 tablets by mouth every 4 (four) hours as needed for severe pain. 12 tablet 0  . promethazine (PHENERGAN) 25 MG tablet Take 1 tablet (25 mg total) by mouth every 6 (six) hours as needed for nausea or vomiting. 12 tablet 0  . traMADol (ULTRAM) 50 MG tablet Take 50 mg by mouth every 8 (eight) hours as needed for moderate pain or severe pain (pain).     . ciprofloxacin (CIPRO) 500 MG tablet Take 1 tablet (500 mg total) by mouth every 12 (twelve) hours. (Patient not taking: Reported on 01/13/2015) 14 tablet 0  . metroNIDAZOLE (  FLAGYL) 500 MG tablet Take 1 tablet (500 mg total) by mouth 3 (three) times daily. (Patient not taking: Reported on 01/13/2015) 21 tablet 0  . ondansetron (ZOFRAN ODT) 8 MG disintegrating tablet Take 1 tablet (8 mg total) by mouth every 8 (eight) hours as needed for nausea or vomiting. (Patient not taking: Reported on 01/13/2015) 20 tablet 0  . oxyCODONE 10 MG TABS Take 1 tablet (10 mg total) by mouth every 4 (four) hours as needed for  moderate pain. (Patient not taking: Reported on 11/30/2014) 40 tablet 0     Results for orders placed or performed during the hospital encounter of 02/04/15 (from the past 48 hour(s))  Lipase, blood     Status: None   Collection Time: 02/04/15  1:41 PM  Result Value Ref Range   Lipase 19 11 - 51 U/L  Comprehensive metabolic panel     Status: Abnormal   Collection Time: 02/04/15  1:41 PM  Result Value Ref Range   Sodium 142 135 - 145 mmol/L   Potassium 4.1 3.5 - 5.1 mmol/L   Chloride 109 101 - 111 mmol/L   CO2 22 22 - 32 mmol/L   Glucose, Bld 150 (H) 65 - 99 mg/dL   BUN 8 6 - 20 mg/dL   Creatinine, Ser 0.72 0.61 - 1.24 mg/dL   Calcium 9.6 8.9 - 10.3 mg/dL   Total Protein 7.8 6.5 - 8.1 g/dL   Albumin 4.8 3.5 - 5.0 g/dL   AST 21 15 - 41 U/L   ALT 16 (L) 17 - 63 U/L   Alkaline Phosphatase 65 38 - 126 U/L   Total Bilirubin 0.5 0.3 - 1.2 mg/dL   GFR calc non Af Amer >60 >60 mL/min   GFR calc Af Amer >60 >60 mL/min    Comment: (NOTE) The eGFR has been calculated using the CKD EPI equation. This calculation has not been validated in all clinical situations. eGFR's persistently <60 mL/min signify possible Chronic Kidney Disease.    Anion gap 11 5 - 15  CBC     Status: None   Collection Time: 02/04/15  1:41 PM  Result Value Ref Range   WBC 7.5 4.0 - 10.5 K/uL   RBC 4.81 4.22 - 5.81 MIL/uL   Hemoglobin 15.4 13.0 - 17.0 g/dL   HCT 44.5 39.0 - 52.0 %   MCV 92.5 78.0 - 100.0 fL   MCH 32.0 26.0 - 34.0 pg   MCHC 34.6 30.0 - 36.0 g/dL   RDW 12.2 11.5 - 15.5 %   Platelets 229 150 - 400 K/uL    Dg Abd Acute W/chest  02/04/2015  CLINICAL DATA:  LEFT lower quadrant abdominal pain today, 6 episodes of vomiting, diarrhea, history diverticulitis, laparoscopic sigmoid colectomy, gastric banding 2012, kidney stones EXAM: DG ABDOMEN ACUTE W/ 1V CHEST COMPARISON:  CT abdomen and pelvis 01/13/2015 FINDINGS: Upper normal heart size. Mediastinal contours and pulmonary vascularity normal. Central  peribronchial thickening. Lungs otherwise clear. No pleural effusion or pneumothorax. Laparoscopic gastric band identified, orientated in a 2 o'clock 8 o'clock plane. Nonobstructive bowel gas pattern. No bowel dilatation, bowel wall thickening or free intraperitoneal air. Bones unremarkable. 5 mm nonobstructing LEFT renal calculus. No definite ureteral calcifications identified. IMPRESSION: Bronchitic changes without pulmonary infiltrate. 5 mm nonobstructing LEFT renal calculus. Normal bowel gas pattern. Electronically Signed   By: Lavonia Dana M.D.   On: 02/04/2015 16:21    Review of Systems  Constitutional: Positive for diaphoresis. Negative for fever.  Respiratory: Negative.  Gastrointestinal: Positive for nausea, vomiting, abdominal pain and diarrhea. Negative for blood in stool and melena.  Genitourinary: Negative.    Blood pressure 170/98, pulse 64, temperature 98.5 F (36.9 C), temperature source Oral, resp. rate 18, height 6' (1.829 m), weight 104.327 kg (230 lb), SpO2 100 %. Physical Exam General: Mildly obese Caucasian male who appears uncomfortable Skin: Warm and dry without rash infection Lungs: Clear breath sounds without increased work of breathing Abdomen: Nondistended. Hypoactive bowel sounds. Well-healed laparoscopic incisions. There is tenderness in the left lower quadrant but no guarding or mass or fullness.   Assessment/Plan: Recurrent episodic left lower quadrant abdominal pain. He has had a very recent entirely negative CT scan. There is no evidence of infection objectively with normal WBC and no fever. I do not think that further scans are going to be revealing. The source of his pain is not at all clear. His episodes seem identical to what he was having prior to resection and I'm not at all sure this point that his preoperative symptoms were secondary to sigmoid diverticulitis. He has a nonobstructing kidney stone but no evidence of recurrent ureteral stone or hematuria. I  discussed with the patient that at this point I'm at somewhat of a loss to explain his symptoms. I discussed with the emergency department physician and we will treat him symptomatically in the ED this evening. I gave him a prescription for 20 Percocet and 15 Phenergan tablets. I will discuss with his gastroenterologist Dr. Benson Norway but he may need a further GI workup for some more unusual source for his recurrent abdominal pain.   Edward Jolly MD, FACS  02/04/2015, 4:57 PM   , T 02/04/2015, 4:49 PM

## 2015-02-04 NOTE — ED Notes (Signed)
Patient was alert, oriented and stable upon discharge. RN went over AVS and patient had no further questions.  

## 2015-02-04 NOTE — ED Notes (Signed)
Pt states he is not able to urinate unless he has some iv fluids.

## 2015-02-04 NOTE — Discharge Instructions (Signed)

## 2015-02-04 NOTE — ED Provider Notes (Signed)
CSN: [id number]     Arrival date & time 02/04/15  1248 History   First MD Initiated Contact with Patient 02/04/15 1341     Chief Complaint  Patient presents with  . Abdominal Pain  . Emesis  . Diarrhea     (Consider location/radiation/quality/duration/timing/severity/associated sxs/prior Treatment) HPI Complains of left lower quadrant pain, nonradiating accompanied by 7 or 8 episodes of nonbloody diarrhea and approximately 15 episodes of nonbloody emesis onset upon awakening this morning. Nothing makes pain better or worse. He states he used the last of his oxycodone tablets which he takes for chronic abdominal pain. No known fever. He treated himself with promethazine however continued to vomit. No other associated symptoms. Nothing makes pain better or worse. Pain is gradual in onset. Progressively worsening Past Medical History  Diagnosis Date  . Arthritis   . Kidney stones   . Diverticulitis    Past Surgical History  Procedure Laterality Date  . Vasectomy  2009  . 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 Tonsillectomy    . Laparoscopic sigmoid colectomy N/A 11/05/2014    Procedure: LAPAROSCOPIC SIGMOID COLECTOMY;  Surgeon: [name], MD;  Location: [facility];  Service: General;  Laterality: N/A;   Family History  Problem Relation Age of Onset  . Heart disease Mother   . Kidney Stones    . Gallstones     Social History  Substance Use Topics  . Smoking status: Former Smoker -- 0.25 packs/day    Types: Cigarettes    Quit date: 01/26/2013  . Smokeless tobacco: Never Used  . Alcohol Use: Yes     Comment: occasional / NONE SINCE JUNE    Review of Systems  Constitutional: Negative.   HENT: Negative.   Respiratory: Negative.   Cardiovascular: Negative.   Gastrointestinal: Positive for vomiting, abdominal pain and diarrhea.  Musculoskeletal: Negative.   Skin: Negative.   Neurological: Negative.   Psychiatric/Behavioral: Negative.   All other systems reviewed and  are negative.     Allergies  Review of patient's allergies indicates no known allergies.  Home Medications   Prior to Admission medications   Medication Sig Start Date End Date Taking? Authorizing Provider  oxyCODONE-acetaminophen (PERCOCET/ROXICET) 5-325 MG tablet Take 1-2 tablets by mouth every 4 (four) hours as needed for severe pain. 11/29/14  Yes Raeford Razor, MD  promethazine (PHENERGAN) 25 MG tablet Take 1 tablet (25 mg total) by mouth every 6 (six) hours as needed for nausea or vomiting. 11/29/14  Yes Raeford Razor, MD  traMADol (ULTRAM) 50 MG tablet Take 50 mg by mouth every 8 (eight) hours as needed for moderate pain or severe pain (pain).  07/03/10  Yes Historical Provider, MD  ciprofloxacin (CIPRO) 500 MG tablet Take 1 tablet (500 mg total) by mouth every 12 (twelve) hours. Patient not taking: Reported on 01/13/2015 11/29/14   Raeford Razor, MD  metroNIDAZOLE (FLAGYL) 500 MG tablet Take 1 tablet (500 mg total) by mouth 3 (three) times daily. Patient not taking: Reported on 01/13/2015 11/29/14   Raeford Razor, MD  ondansetron (ZOFRAN ODT) 8 MG disintegrating tablet Take 1 tablet (8 mg total) by mouth every 8 (eight) hours as needed for nausea or vomiting. Patient not taking: Reported on 01/13/2015 10/26/14   Tatyana Kirichenko, PA-C  oxyCODONE 10 MG TABS Take 1 tablet (10 mg total) by mouth every 4 (four) hours as needed for moderate pain. Patient not taking: Reported on 11/30/2014 11/08/14   [name], MD   BP 170/98 mmHg  Pulse 64  Temp(Src) 98.5 F (36.9 C) (Oral)  Resp 18  Ht 6' (1.829 m)  Wt 230 lb (104.327 kg)  BMI 31.19 kg/m2  SpO2 100% Physical Exam  Constitutional: He appears well-developed and well-nourished. He appears distressed.  Appears uncomfortable  HENT:  Head: Normocephalic and atraumatic.  Eyes: Conjunctivae are normal. Pupils are equal, round, and reactive to light.  Neck: Neck supple. No tracheal deviation present. No thyromegaly present.   Cardiovascular: Normal rate and regular rhythm.   No murmur heard. Pulmonary/Chest: Effort normal and breath sounds normal.  Abdominal: Soft. Bowel sounds are normal. He exhibits no distension and no mass. There is tenderness. There is no rebound and no guarding.  Tender overlying surgical scar left lower quadrant  Genitourinary: Penis normal.  Scrotum normal  Musculoskeletal: Normal range of motion. He exhibits no edema or tenderness.  Neurological: He is alert. Coordination normal.  Skin: Skin is warm and dry. No rash noted.  Psychiatric: He has a normal mood and affect.  Nursing note and vitals reviewed.   ED Course  Procedures (including critical care time) Labs Review Labs Reviewed  COMPREHENSIVE METABOLIC PANEL - Abnormal; Notable for the following:    Glucose, Bld 150 (*)    ALT 16 (*)    All other components within normal limits  LIPASE, BLOOD  CBC  URINALYSIS, ROUTINE W REFLEX MICROSCOPIC (NOT AT [facility])    Imaging Review No results found. I have personally reviewed and evaluated these images and lab results as part of my medical decision-making.   EKG Interpretation None     3:40 PM pain is controlled after treatment with intravenous opioids and intravenous fluids. Requesting more antiemetics after treatment with intravenous Zofran. He has not had any further episodes of vomiting. Intravenous Reglan ordered. Results for orders placed or performed during the hospital encounter of 02/04/15  Lipase, blood  Result Value Ref Range   Lipase 19 11 - 51 U/L  Comprehensive metabolic panel  Result Value Ref Range   Sodium 142 135 - 145 mmol/L   Potassium 4.1 3.5 - 5.1 mmol/L   Chloride 109 101 - 111 mmol/L   CO2 22 22 - 32 mmol/L   Glucose, Bld 150 (H) 65 - 99 mg/dL   BUN 8 6 - 20 mg/dL   Creatinine, Ser 1.61 0.61 - 1.24 mg/dL   Calcium 9.6 8.9 - 09.6 mg/dL   Total Protein 7.8 6.5 - 8.1 g/dL   Albumin 4.8 3.5 - 5.0 g/dL   AST 21 15 - 41 U/L   ALT 16 (L) 17 - 63  U/L   Alkaline Phosphatase 65 38 - 126 U/L   Total Bilirubin 0.5 0.3 - 1.2 mg/dL   GFR calc non Af Amer >60 >60 mL/min   GFR calc Af Amer >60 >60 mL/min   Anion gap 11 5 - 15  CBC  Result Value Ref Range   WBC 7.5 4.0 - 10.5 K/uL   RBC 4.81 4.22 - 5.81 MIL/uL   Hemoglobin 15.4 13.0 - 17.0 g/dL   HCT 04.5 40.9 - 81.1 %   MCV 92.5 78.0 - 100.0 fL   MCH 32.0 26.0 - 34.0 pg   MCHC 34.6 30.0 - 36.0 g/dL   RDW 91.4 78.2 - 95.6 %   Platelets 229 150 - 400 K/uL   Ct Abdomen Pelvis W Contrast  01/13/2015  CLINICAL DATA:  Left lower quadrant pain history of colon surgery. EXAM: CT ABDOMEN AND PELVIS WITH CONTRAST TECHNIQUE: Multidetector CT imaging of the  abdomen and pelvis was performed using the standard protocol following bolus administration of intravenous contrast. CONTRAST:  25mL OMNIPAQUE IOHEXOL 300 MG/ML SOLN, 100mL OMNIPAQUE IOHEXOL 300 MG/ML SOLN COMPARISON:  11/30/2014 FINDINGS: Lower chest: No pleural fluid identified. The lung bases appear clear. Hepatobiliary: No focal liver abnormality identified. Stuck scratch set small stones within the liver are noted which measure up to 4 mm. No gallbladder wall thickening and no pericholecystic fluid. Negative sonographic Murphy's sign. No biliary dilatation identified. Pancreas: Negative. Spleen: Normal appearance of the spleen. Adrenals/Urinary Tract: The adrenal glands are normal. Small bilateral renal calculi noted. The largest is in the left mid kidney measuring 4 mm. The urinary bladder appears normal. Stomach/Bowel: Gastric band is identified. The remaining portions of the stomach and small bowel loops are unremarkable. The appendix is visualized in the right lower quadrant and appears normal. There is no pathologic dilatation of the colon. Postoperative changes involving the sigmoid colon are identified. No complications. Vascular/Lymphatic: Normal appearance of the abdominal aorta. No enlarged retroperitoneal or mesenteric adenopathy. No  enlarged pelvic or inguinal lymph nodes. Reproductive: The prostate gland and seminal vesicles are normal peer Other: There is no ascites or focal fluid collections within the abdomen or pelvis. Musculoskeletal: L3 vertebral hemangioma noted. IMPRESSION: 1. No acute findings identified within the abdomen or pelvis. 2. Status post gastric banding.  No complication identified. 3. Status post resection of sigmoid colon. No complications identified at the anastomotic site. Electronically Signed   By: Signa Kellaylor  Stroud M.D.   On: 01/13/2015 19:55    MDM  Gen. surgical service consulted. Dr. Johna SheriffHoxworth will see patient in the ED. Patient signed out to Dr. Ranae PalmsYelverton 4:15 PM  #1 abdominal pain #2 nausea vomiting diarrhea #3 hyperglycemia Final diagnoses:  None        Doug SouSam Jeffry Vogelsang, MD 02/04/15 [id number]

## 2015-03-17 ENCOUNTER — Encounter (HOSPITAL_COMMUNITY): Payer: Self-pay | Admitting: Emergency Medicine

## 2015-03-17 ENCOUNTER — Emergency Department (HOSPITAL_COMMUNITY)
Admission: EM | Admit: 2015-03-17 | Discharge: 2015-03-17 | Disposition: A | Discharge status: Home / Self Care | Payer: BLUE CROSS/BLUE SHIELD | Attending: Emergency Medicine | Admitting: Emergency Medicine

## 2015-03-17 ENCOUNTER — Emergency Department (HOSPITAL_COMMUNITY): Payer: BLUE CROSS/BLUE SHIELD

## 2015-03-17 DIAGNOSIS — Z8719 Personal history of other diseases of the digestive system: Secondary | ICD-10-CM | POA: Insufficient documentation

## 2015-03-17 DIAGNOSIS — G8929 Other chronic pain: Secondary | ICD-10-CM | POA: Diagnosis not present

## 2015-03-17 DIAGNOSIS — R112 Nausea with vomiting, unspecified: Secondary | ICD-10-CM | POA: Diagnosis not present

## 2015-03-17 DIAGNOSIS — M1990 Unspecified osteoarthritis, unspecified site: Secondary | ICD-10-CM | POA: Insufficient documentation

## 2015-03-17 DIAGNOSIS — Z87891 Personal history of nicotine dependence: Secondary | ICD-10-CM | POA: Insufficient documentation

## 2015-03-17 DIAGNOSIS — Z87442 Personal history of urinary calculi: Secondary | ICD-10-CM | POA: Diagnosis not present

## 2015-03-17 DIAGNOSIS — R1032 Left lower quadrant pain: Secondary | ICD-10-CM | POA: Diagnosis not present

## 2015-03-17 LAB — COMPREHENSIVE METABOLIC PANEL
ALBUMIN: 5 g/dL (ref 3.5–5.0)
ALT: 21 U/L (ref 17–63)
AST: 26 U/L (ref 15–41)
Alkaline Phosphatase: 76 U/L (ref 38–126)
Anion gap: 13 (ref 5–15)
BUN: 13 mg/dL (ref 6–20)
CHLORIDE: 103 mmol/L (ref 101–111)
CO2: 20 mmol/L — AB (ref 22–32)
Calcium: 9.4 mg/dL (ref 8.9–10.3)
Creatinine, Ser: 0.73 mg/dL (ref 0.61–1.24)
GFR calc Af Amer: >60 mL/min (ref >60)
GFR calc non Af Amer: >60 mL/min (ref >60)
GLUCOSE: 169 mg/dL — AB (ref 65–99)
Potassium: 3.6 mmol/L (ref 3.5–5.1)
SODIUM: 136 mmol/L (ref 135–145)
Total Bilirubin: 0.6 mg/dL (ref 0.3–1.2)
Total Protein: 7.9 g/dL (ref 6.5–8.1)

## 2015-03-17 LAB — URINALYSIS, ROUTINE W REFLEX MICROSCOPIC
BILIRUBIN URINE: NEGATIVE
Glucose, UA: 250 mg/dL — AB
HGB URINE DIPSTICK: NEGATIVE
KETONES UR: 15 mg/dL — AB
Leukocytes, UA: NEGATIVE
Nitrite: NEGATIVE
PROTEIN: NEGATIVE mg/dL
Specific Gravity, Urine: 1.026 (ref 1.005–1.030)
pH: 8 (ref 5.0–8.0)

## 2015-03-17 LAB — CBC
HCT: 44.3 % (ref 39.0–52.0)
Hemoglobin: 15.6 g/dL (ref 13.0–17.0)
MCH: 31.8 pg (ref 26.0–34.0)
MCHC: 35.2 g/dL (ref 30.0–36.0)
MCV: 90.4 fL (ref 78.0–100.0)
PLATELETS: 223 10*3/uL (ref 150–400)
RBC: 4.9 MIL/uL (ref 4.22–5.81)
RDW: 12.3 % (ref 11.5–15.5)
WBC: 12.8 10*3/uL — AB (ref 4.0–10.5)

## 2015-03-17 LAB — LIPASE, BLOOD: LIPASE: 21 U/L (ref 11–51)

## 2015-03-17 MED ORDER — IOHEXOL 300 MG/ML  SOLN
50.0000 mL | Freq: Once | INTRAMUSCULAR | Status: AC | PRN
  Administered 2015-03-17: 50 mL via ORAL

## 2015-03-17 MED ORDER — PROMETHAZINE HCL 25 MG/ML IJ SOLN
12.5000 mg | Freq: Once | INTRAMUSCULAR | Status: AC
  Administered 2015-03-17: 12.5 mg via INTRAVENOUS
  Filled 2015-03-17: qty 1

## 2015-03-17 MED ORDER — IOHEXOL 300 MG/ML  SOLN
100.0000 mL | Freq: Once | INTRAMUSCULAR | Status: AC | PRN
  Administered 2015-03-17: 100 mL via INTRAVENOUS

## 2015-03-17 MED ORDER — SODIUM CHLORIDE 0.9 % IV BOLUS (SEPSIS)
1000.0000 mL | Freq: Once | INTRAVENOUS | Status: AC
  Administered 2015-03-17: 1000 mL via INTRAVENOUS

## 2015-03-17 MED ORDER — SODIUM CHLORIDE 0.9 % IV SOLN
INTRAVENOUS | Status: DC
  Administered 2015-03-17: 08:00:00 via INTRAVENOUS

## 2015-03-17 MED ORDER — METOCLOPRAMIDE HCL 5 MG/ML IJ SOLN
10.0000 mg | Freq: Once | INTRAMUSCULAR | Status: AC
  Administered 2015-03-17: 10 mg via INTRAVENOUS
  Filled 2015-03-17: qty 2

## 2015-03-17 MED ORDER — HYDROMORPHONE HCL 1 MG/ML IJ SOLN
1.0000 mg | Freq: Once | INTRAMUSCULAR | Status: AC
  Administered 2015-03-17: 1 mg via INTRAVENOUS
  Filled 2015-03-17: qty 1

## 2015-03-17 MED ORDER — ONDANSETRON 4 MG PO TBDP
4.0000 mg | ORAL_TABLET | Freq: Once | ORAL | Status: DC | PRN
  Filled 2015-03-17: qty 1

## 2015-03-17 MED ORDER — HYDROMORPHONE HCL 1 MG/ML IJ SOLN
1.0000 mg | Freq: Once | INTRAMUSCULAR | Status: AC
Start: 2015-03-17 — End: 2015-03-17
  Administered 2015-03-17: 1 mg via INTRAVENOUS
  Filled 2015-03-17: qty 1

## 2015-03-17 NOTE — Discharge Instructions (Signed)
Workup for the left lower quadrant abdominal pain without any significant findings on CT. Labs without significant abnormalities. Follow-up with your doctors. As stated no evidence of diverticulitis or bowel obstruction today.

## 2015-03-17 NOTE — ED Provider Notes (Signed)
CSN: [id number]     Arrival date & time 03/17/15  0555 History   First MD Initiated Contact with Patient 03/17/15 [phone]     Chief Complaint  Patient presents with  . Emesis     (Consider location/radiation/quality/duration/timing/severity/associated sxs/prior Treatment) Patient is a 40 y.o. male presenting with vomiting. The history is provided by the patient.  Emesis Associated symptoms: no abdominal pain and no headaches    patient presents with complaint of left lower quadrant abdominal pain, 48 hours. Associated with several episodes of nausea vomiting. No blood in the vomit. Patient's history significant for status post laparoscopic gastric banding as well as sigmoid colectomy for diverticulitis. Following the sigmoid colectomy patient has had recurrent diverticulitis. Most recently in November. Patient states pain is 10 out of 10. States that sometimes he said this pain and there unable to figure out what from.  Past Medical History  Diagnosis Date  . Arthritis   . Kidney stones   . Diverticulitis    Past Surgical History  Procedure Laterality Date  . Vasectomy  2009  . 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 Tonsillectomy    . Laparoscopic sigmoid colectomy N/A 11/05/2014    Procedure: LAPAROSCOPIC SIGMOID COLECTOMY;  Surgeon: [name], MD;  Location: [facility];  Service: General;  Laterality: N/A;   Family History  Problem Relation Age of Onset  . Heart disease Mother   . Kidney Stones    . Gallstones     Social History  Substance Use Topics  . Smoking status: Former Smoker -- 0.25 packs/day    Types: Cigarettes    Quit date: 01/26/2013  . Smokeless tobacco: Never Used  . Alcohol Use: Yes     Comment: occasional / NONE SINCE JUNE    Review of Systems  Constitutional: Negative for fever.  HENT: Negative for congestion.   Eyes: Negative for visual disturbance.  Respiratory: Negative for shortness of breath.   Cardiovascular: Negative for chest pain.   Gastrointestinal: Positive for nausea and vomiting. Negative for abdominal pain.  Genitourinary: Negative for dysuria.  Musculoskeletal: Negative for back pain.  Skin: Negative for rash.  Neurological: Negative for headaches.  Hematological: Does not bruise/bleed easily.  Psychiatric/Behavioral: Negative for confusion.      Allergies  Review of patient's allergies indicates no known allergies.  Home Medications   Prior to Admission medications   Medication Sig Start Date End Date Taking? Authorizing Provider  ondansetron (ZOFRAN ODT) 8 MG disintegrating tablet Take 1 tablet (8 mg total) by mouth every 8 (eight) hours as needed for nausea or vomiting. 10/26/14  Yes Tatyana Kirichenko, PA-C  promethazine (PHENERGAN) 25 MG tablet Take 1 tablet (25 mg total) by mouth every 6 (six) hours as needed for nausea or vomiting. 11/29/14  Yes Raeford Razor, MD  traMADol (ULTRAM) 50 MG tablet Take 50 mg by mouth every 8 (eight) hours as needed for moderate pain or severe pain (pain).  07/03/10  Yes Historical Provider, MD  ciprofloxacin (CIPRO) 500 MG tablet Take 1 tablet (500 mg total) by mouth every 12 (twelve) hours. Patient not taking: Reported on 01/13/2015 11/29/14   Raeford Razor, MD  metroNIDAZOLE (FLAGYL) 500 MG tablet Take 1 tablet (500 mg total) by mouth 3 (three) times daily. Patient not taking: Reported on 01/13/2015 11/29/14   Raeford Razor, MD  oxyCODONE 10 MG TABS Take 1 tablet (10 mg total) by mouth every 4 (four) hours as needed for moderate pain. Patient not taking: Reported on 11/30/2014 11/08/14  [name], MD  oxyCODONE-acetaminophen (PERCOCET/ROXICET) 5-325 MG tablet Take 1-2 tablets by mouth every 4 (four) hours as needed for severe pain. Patient not taking: Reported on 03/17/2015 11/29/14   Raeford Razor, MD   BP 153/102 mmHg  Pulse 75  Temp(Src) 98.6 F (37 C) (Oral)  Resp 22  SpO2 100% Physical Exam  Constitutional: He is oriented to person, place, and time. He  appears well-developed and well-nourished. He appears distressed.  HENT:  Head: Normocephalic and atraumatic.  Mouth/Throat: Oropharynx is clear and moist.  Eyes: Conjunctivae and EOM are normal. Pupils are equal, round, and reactive to light.  Neck: Normal range of motion. Neck supple.  Cardiovascular: Normal rate, regular rhythm and normal heart sounds.   No murmur heard. Pulmonary/Chest: Effort normal and breath sounds normal. No respiratory distress.  Abdominal: Soft. Bowel sounds are normal. He exhibits no distension. There is no tenderness.  Musculoskeletal: Normal range of motion.  Neurological: He is alert and oriented to person, place, and time. No cranial nerve deficit. He exhibits normal muscle tone. Coordination normal.  Skin: Skin is warm.  Nursing note and vitals reviewed.   ED Course  Procedures (including critical care time) Labs Review Labs Reviewed  COMPREHENSIVE METABOLIC PANEL - Abnormal; Notable for the following:    CO2 20 (*)    Glucose, Bld 169 (*)    All other components within normal limits  CBC - Abnormal; Notable for the following:    WBC 12.8 (*)    All other components within normal limits  URINALYSIS, ROUTINE W REFLEX MICROSCOPIC (NOT AT [facility]) - Abnormal; Notable for the following:    Glucose, UA 250 (*)    Ketones, ur 15 (*)    All other components within normal limits  LIPASE, BLOOD   Results for orders placed or performed during the hospital encounter of 03/17/15  Lipase, blood  Result Value Ref Range   Lipase 21 11 - 51 U/L  Comprehensive metabolic panel  Result Value Ref Range   Sodium 136 135 - 145 mmol/L   Potassium 3.6 3.5 - 5.1 mmol/L   Chloride 103 101 - 111 mmol/L   CO2 20 (L) 22 - 32 mmol/L   Glucose, Bld 169 (H) 65 - 99 mg/dL   BUN 13 6 - 20 mg/dL   Creatinine, Ser 1.61 0.61 - 1.24 mg/dL   Calcium 9.4 8.9 - 09.6 mg/dL   Total Protein 7.9 6.5 - 8.1 g/dL   Albumin 5.0 3.5 - 5.0 g/dL   AST 26 15 - 41 U/L   ALT 21 17 - 63 U/L    Alkaline Phosphatase 76 38 - 126 U/L   Total Bilirubin 0.6 0.3 - 1.2 mg/dL   GFR calc non Af Amer >60 >60 mL/min   GFR calc Af Amer >60 >60 mL/min   Anion gap 13 5 - 15  CBC  Result Value Ref Range   WBC 12.8 (H) 4.0 - 10.5 K/uL   RBC 4.90 4.22 - 5.81 MIL/uL   Hemoglobin 15.6 13.0 - 17.0 g/dL   HCT 04.5 40.9 - 81.1 %   MCV 90.4 78.0 - 100.0 fL   MCH 31.8 26.0 - 34.0 pg   MCHC 35.2 30.0 - 36.0 g/dL   RDW 91.4 78.2 - 95.6 %   Platelets 223 150 - 400 K/uL  Urinalysis, Routine w reflex microscopic (not at [facility])  Result Value Ref Range   Color, Urine YELLOW YELLOW   APPearance CLEAR CLEAR   Specific Gravity, Urine  1.026 1.005 - 1.030   pH 8.0 5.0 - 8.0   Glucose, UA 250 (A) NEGATIVE mg/dL   Hgb urine dipstick NEGATIVE NEGATIVE   Bilirubin Urine NEGATIVE NEGATIVE   Ketones, ur 15 (A) NEGATIVE mg/dL   Protein, ur NEGATIVE NEGATIVE mg/dL   Nitrite NEGATIVE NEGATIVE   Leukocytes, UA NEGATIVE NEGATIVE    Imaging Review Ct Abdomen Pelvis W Contrast  03/17/2015  CLINICAL DATA:  Left lower quadrant pain EXAM: CT ABDOMEN AND PELVIS WITH CONTRAST TECHNIQUE: Multidetector CT imaging of the abdomen and pelvis was performed using the standard protocol following bolus administration of intravenous contrast. CONTRAST:  50mL OMNIPAQUE IOHEXOL 300 MG/ML SOLN, OMNIPAQUE IOHEXOL 300 MG/ML SOLN COMPARISON:  01/13/2015 FINDINGS: Stable gastric band oriented at 2 o'clock.  Catheter is intact. Cholelithiasis Spleen, pancreas, adrenal glands, and liver are within normal limits. There are tiny calculi in the left renal collecting system. Right kidney is unremarkable There is gas within the appendix. Stranding adjacent to the appendix is nonspecific Postop changes in the sigmoid colon. Sigmoid diverticulosis. There is no evidence of sigmoid wall thickening to suggest acute inflammatory change. Bladder is within normal limits.  Prostate is unremarkable. No free-fluid.  No abnormal adenopathy. L3  hemangioma. IMPRESSION: No acute intra-abdominal pathology. Electronically Signed   By: Jolaine Click M.D.   On: 03/17/2015 09:09   I have personally reviewed and evaluated these images and lab results as part of my medical decision-making.   EKG Interpretation None      MDM   Final diagnoses:  Left lower quadrant pain  Non-intractable vomiting with nausea, vomiting of unspecified type    Patient with history of chronic abdominal pain. Patient also history of diverticulitis. Patient's also had sigmoid colon resection. The patient at risk for recurrent diverticulitis or bowel obstruction. CT scan here is negative no acute findings. Labs without significant abnormalities. Patient's symptoms probably consistent with his chronic abdominal pain and nausea and vomiting. Symptoms improved here with Reglan and hydromorphone and Phenergan. Patient will be discharged with follow-up with his doctor.    Vanetta Mulders, MD 03/17/15 1041

## 2015-03-17 NOTE — ED Notes (Signed)
Pt arrived POV with c/o emesis and LLQ abd pain x 8 hours. 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 Pt states GI unable to find etiology for n/v and abd pain since surgery. Pt very loudly moaning with discomfort and retching into emesis bag with no results.

## 2015-05-16 ENCOUNTER — Other Ambulatory Visit: Payer: Self-pay | Admitting: *Deleted

## 2015-08-02 ENCOUNTER — Emergency Department (HOSPITAL_COMMUNITY): Payer: BLUE CROSS/BLUE SHIELD

## 2015-08-02 ENCOUNTER — Encounter (HOSPITAL_COMMUNITY): Payer: Self-pay | Admitting: Emergency Medicine

## 2015-08-02 ENCOUNTER — Emergency Department (HOSPITAL_COMMUNITY)
Admission: EM | Admit: 2015-08-02 | Discharge: 2015-08-02 | Disposition: A | Discharge status: Home / Self Care | Payer: BLUE CROSS/BLUE SHIELD | Attending: Emergency Medicine | Admitting: Emergency Medicine

## 2015-08-02 DIAGNOSIS — K8020 Calculus of gallbladder without cholecystitis without obstruction: Secondary | ICD-10-CM | POA: Diagnosis not present

## 2015-08-02 DIAGNOSIS — R1013 Epigastric pain: Secondary | ICD-10-CM | POA: Diagnosis present

## 2015-08-02 DIAGNOSIS — M1990 Unspecified osteoarthritis, unspecified site: Secondary | ICD-10-CM | POA: Insufficient documentation

## 2015-08-02 DIAGNOSIS — Z87891 Personal history of nicotine dependence: Secondary | ICD-10-CM | POA: Insufficient documentation

## 2015-08-02 DIAGNOSIS — Z79899 Other long term (current) drug therapy: Secondary | ICD-10-CM | POA: Insufficient documentation

## 2015-08-02 LAB — COMPREHENSIVE METABOLIC PANEL WITH GFR
ALT: 23 U/L (ref 17–63)
AST: 29 U/L (ref 15–41)
Albumin: 5 g/dL (ref 3.5–5.0)
Alkaline Phosphatase: 70 U/L (ref 38–126)
Anion gap: 10 (ref 5–15)
BUN: 14 mg/dL (ref 6–20)
CO2: 22 mmol/L (ref 22–32)
Calcium: 9.3 mg/dL (ref 8.9–10.3)
Chloride: 106 mmol/L (ref 101–111)
Creatinine, Ser: 0.85 mg/dL (ref 0.61–1.24)
GFR calc Af Amer: >60 mL/min
GFR calc non Af Amer: >60 mL/min
Glucose, Bld: 145 mg/dL — ABNORMAL HIGH (ref 65–99)
Potassium: 3.6 mmol/L (ref 3.5–5.1)
Sodium: 138 mmol/L (ref 135–145)
Total Bilirubin: 0.9 mg/dL (ref 0.3–1.2)
Total Protein: 8.2 g/dL — ABNORMAL HIGH (ref 6.5–8.1)

## 2015-08-02 LAB — CBC
HEMATOCRIT: 44.7 % (ref 39.0–52.0)
Hemoglobin: 17 g/dL (ref 13.0–17.0)
MCH: 33.1 pg (ref 26.0–34.0)
MCHC: 38 g/dL — AB (ref 30.0–36.0)
MCV: 87 fL (ref 78.0–100.0)
Platelets: 222 10*3/uL (ref 150–400)
RBC: 5.15 MIL/uL (ref 4.22–5.81)
RDW: 11.7 % (ref 11.5–15.5)
WBC: 10.1 10*3/uL (ref 4.0–10.5)

## 2015-08-02 LAB — LIPASE, BLOOD: Lipase: 21 U/L (ref 11–51)

## 2015-08-02 MED ORDER — ONDANSETRON HCL 4 MG/2ML IJ SOLN
4.0000 mg | Freq: Once | INTRAMUSCULAR | Status: AC
  Administered 2015-08-02: 4 mg via INTRAVENOUS
  Filled 2015-08-02: qty 2

## 2015-08-02 MED ORDER — SODIUM CHLORIDE 0.9 % IV BOLUS (SEPSIS)
1000.0000 mL | Freq: Once | INTRAVENOUS | Status: AC
  Administered 2015-08-02: 1000 mL via INTRAVENOUS

## 2015-08-02 MED ORDER — HYDROMORPHONE HCL 1 MG/ML IJ SOLN
1.0000 mg | Freq: Once | INTRAMUSCULAR | Status: AC
  Administered 2015-08-02: 1 mg via INTRAVENOUS
  Filled 2015-08-02: qty 1

## 2015-08-02 MED ORDER — ONDANSETRON HCL 4 MG/2ML IJ SOLN
4.0000 mg | Freq: Once | INTRAMUSCULAR | Status: DC | PRN
  Filled 2015-08-02: qty 2

## 2015-08-02 MED ORDER — ONDANSETRON 8 MG PO TBDP: 8.0000 mg | ORAL_TABLET | Freq: Three times a day (TID) | ORAL | Status: DC | PRN

## 2015-08-02 MED ORDER — SODIUM CHLORIDE 0.9 % IV SOLN
Freq: Once | INTRAVENOUS | Status: AC
  Administered 2015-08-02: 1000 mL via INTRAVENOUS

## 2015-08-02 NOTE — ED Provider Notes (Signed)
CSN: [id number]     Arrival date & time 08/02/15  1039 History   First MD Initiated Contact with Patient 08/02/15 1041     Chief Complaint  Patient presents with  . Abdominal Pain    Patient is a 40 y.o. male presenting with abdominal pain. The history is provided by the patient.  Abdominal Pain Pain location:  Epigastric Pain quality: aching   Pain severity:  Severe Onset quality:  Sudden Duration:  5 hours Timing:  Constant Progression:  Worsening Chronicity:  New Relieved by:  Nothing Worsened by:  Palpation and movement Associated symptoms: nausea and vomiting   Associated symptoms: no chest pain, no constipation, no diarrhea, no dysuria, no fever, no hematemesis, no hematochezia and no shortness of breath   Patient presents with acute onset of abd pain and vomiting He reports similar episode about 3 days ago No diarrhea reported He is s/p colectomy for diverticulitis in 2016 He reports most of his pain in the upper abdomen  Past Medical History  Diagnosis Date  . Arthritis   . Kidney stones   . Diverticulitis    Past Surgical History  Procedure Laterality Date  . Vasectomy  2009  . 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 Tonsillectomy    . Laparoscopic sigmoid colectomy N/A 11/05/2014    Procedure: LAPAROSCOPIC SIGMOID COLECTOMY;  Surgeon: Glenna FellowsBenjamin Hoxworth, MD;  Location: [facility];  Service: General;  Laterality: N/A;   Family History  Problem Relation Age of Onset  . Heart disease Mother   . Kidney Stones    . Gallstones     Social History  Substance Use Topics  . Smoking status: Former Smoker -- 0.25 packs/day    Types: Cigarettes    Quit date: 01/26/2013  . Smokeless tobacco: Never Used  . Alcohol Use: Yes     Comment: occasional / NONE SINCE JUNE    Review of Systems  Constitutional: Negative for fever.  Respiratory: Negative for shortness of breath.   Cardiovascular: Negative for chest pain.  Gastrointestinal: Positive for nausea, vomiting and  abdominal pain. Negative for diarrhea, constipation, blood in stool, hematochezia and hematemesis.  Genitourinary: Negative for dysuria.  All other systems reviewed and are negative.     Allergies  Review of patient's allergies indicates no known allergies.  Home Medications   Prior to Admission medications   Medication Sig Start Date End Date Taking? Authorizing Provider  hyoscyamine (LEVSIN/SL) 0.125 MG SL tablet Place 0.125 mg under the tongue every 4 (four) hours as needed. Abdominal pain 04/22/15  Yes Historical Provider, MD  traMADol (ULTRAM) 50 MG tablet Take 50-100 mg by mouth every 8 (eight) hours as needed for moderate pain or severe pain (pain).  07/03/10  Yes Historical Provider, MD  promethazine (PHENERGAN) 25 MG suppository Place 25 mg rectally daily as needed. Nausea/vomitting 05/21/15   Historical Provider, MD  promethazine (PHENERGAN) 25 MG tablet Take 25 mg by mouth every 6 (six) hours as needed. Nausea/vomitting    Historical Provider, MD   BP 183/128 mmHg  Pulse 64  Temp(Src) 98.3 F (36.8 C) (Oral)  Resp 24  Ht 5\' 11"  (1.803 m)  Wt 96.163 kg  BMI 29.58 kg/m2  SpO2 100% Physical Exam CONSTITUTIONAL: Well developed/well nourished HEAD: Normocephalic/atraumatic EYES: EOMI/PERRL, no icterus ENMT: Mucous membranes dry NECK: supple no meningeal signs SPINE/BACK:entire spine nontender CV: S1/S2 noted, no murmurs/rubs/gallops noted LUNGS: Lungs are clear to auscultation bilaterally, no apparent distress ABDOMEN: soft, moderate epigastric tenderness, no rebound or guarding,  bowel sounds noted throughout abdomen GU:no cva tenderness NEURO: Pt is awake/alert/appropriate, moves all extremitiesx4.  No facial droop.   EXTREMITIES: pulses normal/equal, full ROM SKIN: warm, color normal PSYCH: no abnormalities of mood noted, alert and oriented to situation  ED Course  Procedures  12:01 PM Previous CT imaging has revealed cholelithiasis US imaging ordered 2:40 PM Pt  with cholelithiasis without cholecystitis He appears more comfortable No vomiting Will d/c home with f/u with surgery (he has seen dr Johna Sheriffhoxworth previously) We discussed strict return precautions He has tramadol at home per PCP Labs Review Labs Reviewed  COMPREHENSIVE METABOLIC PANEL - Abnormal; Notable for the following:    Glucose, Bld 145 (*)    Total Protein 8.2 (*)    All other components within normal limits  CBC - Abnormal; Notable for the following:    MCHC 38.0 (*)    All other components within normal limits  LIPASE, BLOOD    Imaging Review [place] Abdomen Limited  08/02/2015  CLINICAL DATA:  Nausea, vomiting and abdominal pain over the last year. Acute episode today. EXAM: US ABDOMEN LIMITED - RIGHT UPPER QUADRANT COMPARISON:  CT 03/17/2015 FINDINGS: Gallbladder: Chololithiasis with gallstones dependent within the gallbladder. Largest measurable stone is 6 mm. No gallbladder wall thickening or appear cholecystic fluid. Common bile duct: Diameter: 2 mm, normal Liver: Normal echogenicity. No focal lesions or intrahepatic ductal dilatation. IMPRESSION: Chololithiasis. Small gallstones layering dependently in the gallbladder, largest 6 mm. No sonographic evidence of cholecystitis or obstruction. Electronically Signed   By: Paulina FusiMark  Shogry M.D.   On: 08/02/2015 12:33   I have personally reviewed and evaluated these images and lab results as part of my medical decision-making.   Medications  ondansetron (ZOFRAN) injection 4 mg (not administered)  sodium chloride 0.9 % bolus 1,000 mL (0 mLs Intravenous Stopped 08/02/15 1137)  HYDROmorphone (DILAUDID) injection 1 mg (1 mg Intravenous Given 08/02/15 1126)  ondansetron (ZOFRAN) injection 4 mg (4 mg Intravenous Given 08/02/15 1126)  0.9 %  sodium chloride infusion ( Intravenous Stopped 08/02/15 1436)  HYDROmorphone (DILAUDID) injection 1 mg (1 mg Intravenous Given 08/02/15 1203)  ondansetron (ZOFRAN) injection 4 mg (4 mg Intravenous Given 08/02/15 1203)     MDM   Final diagnoses:  Calculus of gallbladder without cholecystitis without obstruction    Nursing notes including past medical history and social history reviewed and considered in documentation Labs/vital reviewed myself and considered during evaluation xrays/imaging reviewed by myself and considered during evaluation Narcotic database reviewed and considered in decision making     Zadie Rhineonald Harlow Carrizales, MD 08/02/15 1441

## 2015-08-02 NOTE — ED Notes (Signed)
Pt presents with sudden onset of N/V abd cramping at 0530.

## 2015-08-02 NOTE — ED Notes (Signed)
Pt complaint of LLQ constant abdominal pain; reports recent diverticulitis flare up. Pt reports emesis but denies diarrhea.

## 2015-08-02 NOTE — ED Notes (Signed)
Pt stated unable to give urine sample, urinal and call light at bedside.

## 2015-08-02 NOTE — ED Notes (Signed)
Pt given cup of water, tolerated well.

## 2015-08-02 NOTE — Discharge Instructions (Signed)

## 2015-08-27 ENCOUNTER — Other Ambulatory Visit: Payer: Self-pay | Admitting: General Surgery

## 2015-11-14 DIAGNOSIS — Z0000 Encounter for general adult medical examination without abnormal findings: Secondary | ICD-10-CM | POA: Diagnosis not present

## 2015-11-14 DIAGNOSIS — Z23 Encounter for immunization: Secondary | ICD-10-CM | POA: Diagnosis not present

## 2016-01-03 ENCOUNTER — Emergency Department (HOSPITAL_COMMUNITY): Payer: BLUE CROSS/BLUE SHIELD

## 2016-01-03 ENCOUNTER — Encounter (HOSPITAL_COMMUNITY): Payer: Self-pay | Admitting: Emergency Medicine

## 2016-01-03 ENCOUNTER — Emergency Department (HOSPITAL_COMMUNITY)
Admission: EM | Admit: 2016-01-03 | Discharge: 2016-01-03 | Disposition: A | Discharge status: Home / Self Care | Payer: BLUE CROSS/BLUE SHIELD | Attending: Physician Assistant | Admitting: Physician Assistant

## 2016-01-03 DIAGNOSIS — Z87891 Personal history of nicotine dependence: Secondary | ICD-10-CM | POA: Insufficient documentation

## 2016-01-03 DIAGNOSIS — R1032 Left lower quadrant pain: Secondary | ICD-10-CM | POA: Diagnosis present

## 2016-01-03 DIAGNOSIS — N200 Calculus of kidney: Secondary | ICD-10-CM | POA: Diagnosis not present

## 2016-01-03 DIAGNOSIS — R112 Nausea with vomiting, unspecified: Secondary | ICD-10-CM | POA: Insufficient documentation

## 2016-01-03 LAB — COMPREHENSIVE METABOLIC PANEL
ALBUMIN: 5 g/dL (ref 3.5–5.0)
ALK PHOS: 56 U/L (ref 38–126)
ALT: 36 U/L (ref 17–63)
ANION GAP: 10 (ref 5–15)
AST: 43 U/L — ABNORMAL HIGH (ref 15–41)
BILIRUBIN TOTAL: 1.1 mg/dL (ref 0.3–1.2)
BUN: 9 mg/dL (ref 6–20)
CO2: 23 mmol/L (ref 22–32)
Calcium: 9.6 mg/dL (ref 8.9–10.3)
Chloride: 106 mmol/L (ref 101–111)
Creatinine, Ser: 0.74 mg/dL (ref 0.61–1.24)
GFR calc non Af Amer: >60 mL/min (ref >60)
GLUCOSE: 130 mg/dL — AB (ref 65–99)
POTASSIUM: 4.3 mmol/L (ref 3.5–5.1)
Sodium: 139 mmol/L (ref 135–145)
Total Protein: 7.7 g/dL (ref 6.5–8.1)

## 2016-01-03 LAB — CBC WITH DIFFERENTIAL/PLATELET
Basophils Absolute: 0 10*3/uL (ref 0.0–0.1)
Basophils Relative: 0 %
EOS ABS: 0 10*3/uL (ref 0.0–0.7)
EOS PCT: 0 %
HCT: 43.4 % (ref 39.0–52.0)
Hemoglobin: 15.7 g/dL (ref 13.0–17.0)
LYMPHS ABS: 0.9 10*3/uL (ref 0.7–4.0)
Lymphocytes Relative: 8 %
MCH: 32.7 pg (ref 26.0–34.0)
MCHC: 36.2 g/dL — AB (ref 30.0–36.0)
MCV: 90.4 fL (ref 78.0–100.0)
MONO ABS: 0.2 10*3/uL (ref 0.1–1.0)
MONOS PCT: 2 %
Neutro Abs: 9.9 10*3/uL — ABNORMAL HIGH (ref 1.7–7.7)
Neutrophils Relative %: 90 %
PLATELETS: 224 10*3/uL (ref 150–400)
RBC: 4.8 MIL/uL (ref 4.22–5.81)
RDW: 12 % (ref 11.5–15.5)
WBC: 11 10*3/uL — ABNORMAL HIGH (ref 4.0–10.5)

## 2016-01-03 LAB — I-STAT TROPONIN, ED: Troponin i, poc: 0 ng/mL (ref 0.00–0.08)

## 2016-01-03 LAB — PROTIME-INR
INR: 0.93
Prothrombin Time: 12.4 seconds (ref 11.4–15.2)

## 2016-01-03 LAB — LIPASE, BLOOD: LIPASE: 16 U/L (ref 11–51)

## 2016-01-03 MED ORDER — MORPHINE SULFATE (PF) 4 MG/ML IV SOLN
4.0000 mg | Freq: Once | INTRAVENOUS | Status: AC
  Administered 2016-01-03: 4 mg via INTRAVENOUS
  Filled 2016-01-03: qty 1

## 2016-01-03 MED ORDER — SODIUM CHLORIDE 0.9 % IJ SOLN
INTRAMUSCULAR | Status: AC
  Filled 2016-01-03: qty 50

## 2016-01-03 MED ORDER — IOPAMIDOL (ISOVUE-300) INJECTION 61%
INTRAVENOUS | Status: AC
  Filled 2016-01-03: qty 100

## 2016-01-03 MED ORDER — IOPAMIDOL (ISOVUE-300) INJECTION 61%
100.0000 mL | Freq: Once | INTRAVENOUS | Status: AC | PRN
  Administered 2016-01-03: 100 mL via INTRAVENOUS

## 2016-01-03 MED ORDER — LORAZEPAM 0.5 MG PO TABS: 0.5000 mg | ORAL_TABLET | Freq: Once | ORAL | Status: DC

## 2016-01-03 MED ORDER — METOCLOPRAMIDE HCL 5 MG/ML IJ SOLN
10.0000 mg | Freq: Once | INTRAMUSCULAR | Status: AC
  Administered 2016-01-03: 10 mg via INTRAVENOUS
  Filled 2016-01-03: qty 2

## 2016-01-03 MED ORDER — ONDANSETRON HCL 4 MG PO TABS: 4.0000 mg | ORAL_TABLET | Freq: Three times a day (TID) | ORAL | 0 refills | Status: DC | PRN

## 2016-01-03 MED ORDER — ONDANSETRON HCL 4 MG/2ML IJ SOLN
4.0000 mg | Freq: Once | INTRAMUSCULAR | Status: AC
  Administered 2016-01-03: 4 mg via INTRAVENOUS
  Filled 2016-01-03: qty 2

## 2016-01-03 MED ORDER — SODIUM CHLORIDE 0.9 % IV BOLUS (SEPSIS)
1000.0000 mL | Freq: Once | INTRAVENOUS | Status: AC
  Administered 2016-01-03: 1000 mL via INTRAVENOUS

## 2016-01-03 MED ORDER — HALOPERIDOL LACTATE 5 MG/ML IJ SOLN
5.0000 mg | Freq: Once | INTRAMUSCULAR | Status: AC
  Administered 2016-01-03: 5 mg via INTRAVENOUS
  Filled 2016-01-03: qty 1

## 2016-01-03 MED ORDER — HYDROMORPHONE HCL 1 MG/ML IJ SOLN
0.5000 mg | Freq: Once | INTRAMUSCULAR | Status: AC
  Administered 2016-01-03: 0.5 mg via INTRAVENOUS
  Filled 2016-01-03: qty 1

## 2016-01-03 NOTE — ED Notes (Signed)
Patient tolerated po fluids

## 2016-01-03 NOTE — ED Notes (Signed)
Patient states that medications are not working and will need to let doctor know that he needs another dose.   Made Dr Corlis LeakMackuen aware.

## 2016-01-03 NOTE — ED Provider Notes (Signed)
[facility]-EMERGENCY DEPT Provider Note   CSN: [id number] Arrival date & time: 01/03/16  1324     History   Chief Complaint Chief Complaint  Patient presents with  . Emesis  . Abdominal Pain    HPI Barry Parsons is a 40 y.o. male.  HPI   Patient is a 40 year old male presenting with abdominal pain vomiting. This started this morning at 9 AM. Patient reports that he had a normal bowel movement and then all of a sudden had a cruciate in pain left lower quadrant. He noticed that he's been vomiting. Unable to hold anything down. Patient has had history of diverticulitis complicated by a bowel resection last year this time. Patient also smokes cannabis daily.  Patient has noted mild chills and fever. No sick contacts. No trouble urinating or blood in urine. No pain with urination.  Past Medical History:  Diagnosis Date  . Arthritis   . Diverticulitis   . Kidney stones     Patient Active Problem List   Diagnosis Date Noted  . Post-operative pain 11/30/2014  . Testicular/scrotal pain 06/05/2014  . Metabolic acidosis with normal anion gap and bicarbonate losses 06/05/2014  . Nausea & vomiting 06/05/2014  . AP (abdominal pain)   . Nausea vomiting and diarrhea   . Acute gastroenteritis 06/03/2014  . 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 Morbid obesity (HCC) 09/04/2010    Past Surgical History:  Procedure Laterality Date  . LAPAROSCOPIC GASTRIC BANDING  11/18/10  . LAPAROSCOPIC SIGMOID COLECTOMY N/A 11/05/2014   Procedure: LAPAROSCOPIC SIGMOID COLECTOMY;  Surgeon: Glenna FellowsBenjamin Hoxworth, MD;  Location: [facility];  Service: General;  Laterality: N/A;  . TONSILLECTOMY    . VASECTOMY  2009       Home Medications    Prior to Admission medications   Medication Sig Start Date End Date Taking? Authorizing Provider  hyoscyamine (LEVSIN/SL) 0.125 MG SL tablet Place 0.125 mg under the tongue every 4 (four) hours as needed. Abdominal pain 04/22/15  Yes Historical Provider, MD  naproxen  sodium (ANAPROX) 220 MG tablet Take 440 mg by mouth 2 (two) times daily with a meal.   Yes Historical Provider, MD  traMADol (ULTRAM) 50 MG tablet Take 50-100 mg by mouth every 8 (eight) hours as needed for moderate pain or severe pain (pain).  07/03/10  Yes Historical Provider, MD  ondansetron (ZOFRAN ODT) 8 MG disintegrating tablet Take 1 tablet (8 mg total) by mouth every 8 (eight) hours as needed for refractory nausea / vomiting. 8mg  ODT q4 hours prn nausea Patient not taking: Reported on 01/03/2016 08/02/15   Zadie Rhineonald Wickline, MD  ondansetron (ZOFRAN) 4 MG tablet Take 1 tablet (4 mg total) by mouth every 8 (eight) hours as needed for nausea or vomiting. 01/03/16   Copelan Maultsby Lyn Quanisha Drewry, MD    Family History Family History  Problem Relation Age of Onset  . Heart disease Mother   . Kidney Stones    . Gallstones      Social History Social History  Substance Use Topics  . Smoking status: Former Smoker    Packs/day: 0.25    Types: Cigarettes    Quit date: 01/26/2013  . Smokeless tobacco: Never Used  . Alcohol use Yes     Comment: occasional / NONE SINCE JUNE     Allergies   Patient has no known allergies.   Review of Systems Review of Systems  Constitutional: Negative for activity change.  Respiratory: Negative for shortness of breath.   Cardiovascular: Negative for chest pain.  Gastrointestinal: Positive for abdominal pain, nausea and vomiting.  Genitourinary: Negative for dysuria and frequency.     Physical Exam Updated Vital Signs BP 152/73 (BP Location: Left Arm)   Pulse 68   Resp 20   Ht 6' (1.829 m)   Wt 204 lb 6.4 oz (92.7 kg)   SpO2 97%   BMI 27.72 kg/m   Physical Exam  Constitutional: He is oriented to person, place, and time. He appears well-nourished.  Patient presents distressed, lying prone on bed , moaning.  HENT:  Head: Normocephalic.  Eyes: Conjunctivae are normal.  Cardiovascular: Normal rate.   Pulmonary/Chest: Effort normal.  Abdominal: There is  tenderness.  Mild tenderness left lower quadrant.  Neurological: He is oriented to person, place, and time.  Skin: Skin is warm and dry. He is not diaphoretic.  Psychiatric: He has a normal mood and affect. His behavior is normal.     ED Treatments / Results  Labs (all labs ordered are listed, but only abnormal results are displayed) Labs Reviewed  COMPREHENSIVE METABOLIC PANEL - Abnormal; Notable for the following:       Result Value   Glucose, Bld 130 (*)    AST 43 (*)    All other components within normal limits  CBC WITH DIFFERENTIAL/PLATELET - Abnormal; Notable for the following:    WBC 11.0 (*)    MCHC 36.2 (*)    Neutro Abs 9.9 (*)    All other components within normal limits  LIPASE, BLOOD  PROTIME-INR  I-STAT TROPOININ, ED    EKG  EKG Interpretation None       Radiology Ct Abdomen Pelvis W Contrast  Result Date: 01/03/2016 CLINICAL DATA:  Abdominal pain, vomiting.  Left lower quadrant pain. EXAM: CT ABDOMEN AND PELVIS WITH CONTRAST TECHNIQUE: Multidetector CT imaging of the abdomen and pelvis was performed using the standard protocol following bolus administration of intravenous contrast. CONTRAST:  100mL ISOVUE-300 IOPAMIDOL (ISOVUE-300) INJECTION 61% COMPARISON:  03/17/2015 FINDINGS: Lower chest: Lung bases are clear. No effusions. Heart is normal size. Hepatobiliary: Prior cholecystectomy.  No focal hepatic abnormality. Pancreas: No focal abnormality or ductal dilatation. Spleen: No focal abnormality.  Normal size. Adrenals/Urinary Tract: 4 mm nonobstructing stone in the midpole of the left kidney. No hydronephrosis. Adrenal glands and urinary bladder unremarkable. Stomach/Bowel: Postoperative changes in the sigmoid colon. There is sigmoid diverticulosis. No active diverticulitis. Appendix is normal. Gastric band device noted in place. Stomach and small bowel unremarkable. Vascular/Lymphatic: No evidence of aneurysm or adenopathy. Reproductive: No visible focal  abnormality. Other: No free fluid or free air. Musculoskeletal: No acute bony abnormality or focal bone lesion. IMPRESSION: No acute findings in the abdomen or pelvis. Left nephrolithiasis. Gastric band device noted in expected position. Surgical changes in the sigmoid colon. Sigmoid diverticulosis.  No evidence for active diverticulitis. Electronically Signed   By: Charlett NoseKevin  Dover M.D.   On: 01/03/2016 16:06    Procedures Procedures (including critical care time)  Medications Ordered in ED Medications  iopamidol (ISOVUE-300) 61 % injection (not administered)  sodium chloride 0.9 % injection (not administered)  sodium chloride 0.9 % bolus 1,000 mL (0 mLs Intravenous Stopped 01/03/16 1550)  morphine 4 MG/ML injection 4 mg (4 mg Intravenous Given 01/03/16 1440)  ondansetron (ZOFRAN) injection 4 mg (4 mg Intravenous Given 01/03/16 1440)  HYDROmorphone (DILAUDID) injection 0.5 mg (0.5 mg Intravenous Given 01/03/16 1546)  metoCLOPramide (REGLAN) injection 10 mg (10 mg Intravenous Given 01/03/16 1546)  iopamidol (ISOVUE-300) 61 % injection 100 mL (100 mLs  Intravenous Contrast Given 01/03/16 1551)  sodium chloride 0.9 % bolus 1,000 mL (1,000 mLs Intravenous New Bag/Given 01/03/16 1625)  haloperidol lactate (HALDOL) injection 5 mg (5 mg Intravenous Given 01/03/16 1702)     Initial Impression / Assessment and Plan / ED Course  I have reviewed the triage vital signs and the nursing notes.  Pertinent labs & imaging results that were available during my care of the patient were reviewed by me and considered in my medical decision making (see chart for details).   Patient is a 40 year old male presenting with abdominal pain and vomiting. Patient abbdominal pain starting at 9 AM. Patient has history of diverticulosis with resection. We will get CT, labs. Rule out need for surgical intervention including small bowel obstruction.   5:25 PM Labs normal. Ct shows no surgerical process. Patient's wife at bedside and  she said she is not surrised that work up in negative because he has had multiple episodes of continued nausea vomiting even despite having the resection of his bowel and also cholecystectomy. I did approach the idea that this could be due to cannabis so that patient'sand patient's family would have that information at their disposal.   We'll have patient bowel rest at home with zofran and return as needed.   Clinical Course      Final Clinical Impressions(s) / ED Diagnoses   Final diagnoses:  Nausea and vomiting, intractability of vomiting not specified, unspecified vomiting type    New Prescriptions New Prescriptions   ONDANSETRON (ZOFRAN) 4 MG TABLET    Take 1 tablet (4 mg total) by mouth every 8 (eight) hours as needed for nausea or vomiting.     Burkley Dech Randall An, MD 01/03/16 1725

## 2016-01-03 NOTE — ED Triage Notes (Signed)
Patient states all the symptoms started suddenly around 9 am this morning after having a BM.  Patient states BM "had corn and shit in it" when asked if was loose or formed.  Patient reports abd feels like diverticulitis and intestines "are swollen".

## 2016-01-03 NOTE — ED Notes (Signed)
Patient transported to CT 

## 2016-01-03 NOTE — Discharge Instructions (Signed)
Your CAT scan and labs were normal today. Please use Zofran to help with nausea. Please follow-up with their primary care physician and a GI physician.

## 2016-01-15 ENCOUNTER — Telehealth: Payer: Self-pay

## 2016-01-15 NOTE — Telephone Encounter (Signed)
[facility] to Historical Provider

## 2016-01-17 ENCOUNTER — Telehealth: Payer: Self-pay | Admitting: Internal Medicine

## 2016-01-17 NOTE — Telephone Encounter (Signed)
Patient records received from Dr.Hung's office. Patient states he is still having GI problems after procedure and does not believe previous doctor can help anymore. Records placed on morning DOD 01/17/16 desk for review.

## 2016-01-22 NOTE — Telephone Encounter (Signed)
DR. Marina GoodellPERRY REVIEWED RECORDS AND STATED HE " COULD NOT ACCOMMODATE AT THIS TIME"  PT NOTIFIED.

## 2016-01-29 DIAGNOSIS — R1032 Left lower quadrant pain: Secondary | ICD-10-CM | POA: Diagnosis not present

## 2016-03-16 DIAGNOSIS — R6889 Other general symptoms and signs: Secondary | ICD-10-CM | POA: Diagnosis not present

## 2016-03-16 DIAGNOSIS — R109 Unspecified abdominal pain: Secondary | ICD-10-CM | POA: Diagnosis not present

## 2016-03-20 ENCOUNTER — Other Ambulatory Visit: Payer: Self-pay | Admitting: Gastroenterology

## 2016-03-20 DIAGNOSIS — R109 Unspecified abdominal pain: Secondary | ICD-10-CM

## 2016-03-29 ENCOUNTER — Other Ambulatory Visit: Payer: BLUE CROSS/BLUE SHIELD

## 2016-03-30 DIAGNOSIS — G8929 Other chronic pain: Secondary | ICD-10-CM | POA: Diagnosis not present

## 2016-03-30 DIAGNOSIS — Z0000 Encounter for general adult medical examination without abnormal findings: Secondary | ICD-10-CM | POA: Diagnosis not present

## 2016-03-30 DIAGNOSIS — M25519 Pain in unspecified shoulder: Secondary | ICD-10-CM | POA: Diagnosis not present

## 2016-03-30 DIAGNOSIS — G894 Chronic pain syndrome: Secondary | ICD-10-CM | POA: Diagnosis not present

## 2016-04-09 ENCOUNTER — Ambulatory Visit
Admission: RE | Admit: 2016-04-09 | Discharge: 2016-04-09 | Disposition: A | Discharge status: Home / Self Care | Payer: BLUE CROSS/BLUE SHIELD | Source: Ambulatory Visit | Attending: Gastroenterology | Admitting: Gastroenterology

## 2016-04-09 DIAGNOSIS — R935 Abnormal findings on diagnostic imaging of other abdominal regions, including retroperitoneum: Secondary | ICD-10-CM | POA: Diagnosis not present

## 2016-04-09 DIAGNOSIS — R109 Unspecified abdominal pain: Secondary | ICD-10-CM | POA: Diagnosis not present

## 2016-04-09 MED ORDER — GADOBENATE DIMEGLUMINE 529 MG/ML IV SOLN
19.0000 mL | Freq: Once | INTRAVENOUS | Status: AC | PRN
  Administered 2016-04-09: 19 mL via INTRAVENOUS

## 2016-04-20 DIAGNOSIS — R109 Unspecified abdominal pain: Secondary | ICD-10-CM | POA: Diagnosis not present

## 2016-08-28 IMAGING — US US ART/VEN ABD/PELV/SCROTUM DOPPLER LTD
1 series · 14 of 25 positions shown · non-contrast
Comparison: CT 06/05/2014.

CLINICAL DATA: LEFT testicular pain for 2 days.  Initial encounter.

EXAM:
SCROTAL ULTRASOUND
DOPPLER ULTRASOUND OF THE TESTICLES
TECHNIQUE: Complete ultrasound examination of the testicles, epididymis, and
other scrotal structures was performed. Color and spectral Doppler
ultrasound were also utilized to evaluate blood flow to the
testicles.

[Series 1: us art/ven abd/pelv/scrotum doppler ltd · 0.06mm/px · 14 of 55 slices shown]
[im 1/55]
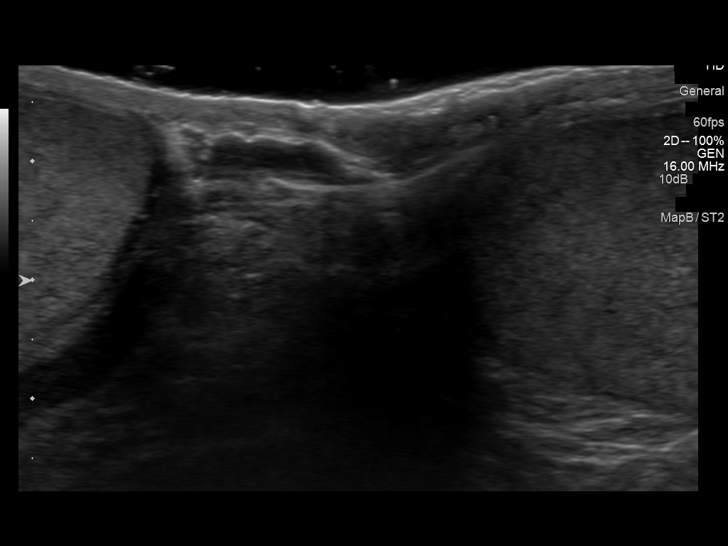
[im 5/55]
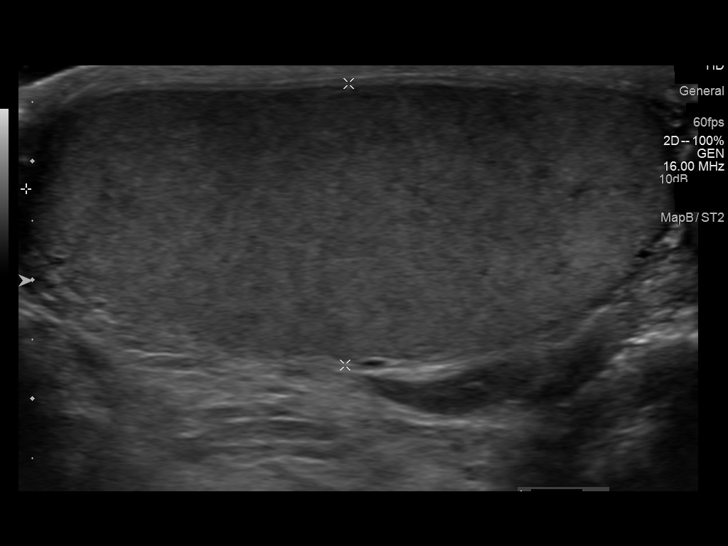
[im 10/55]
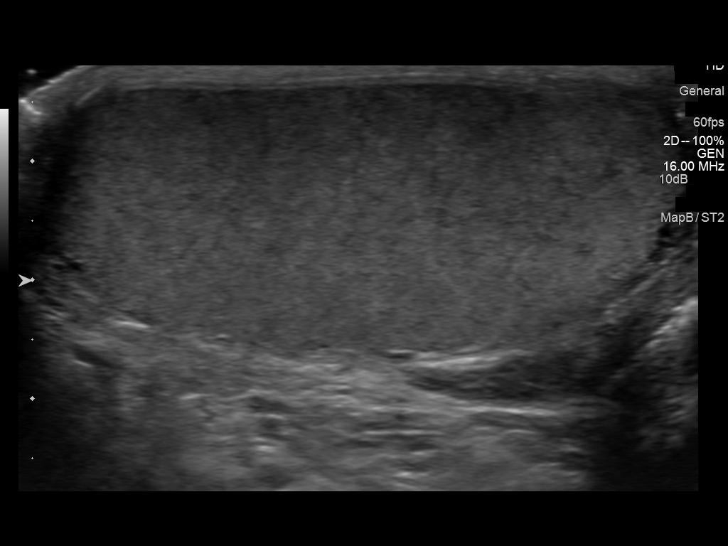
[im 14/55]
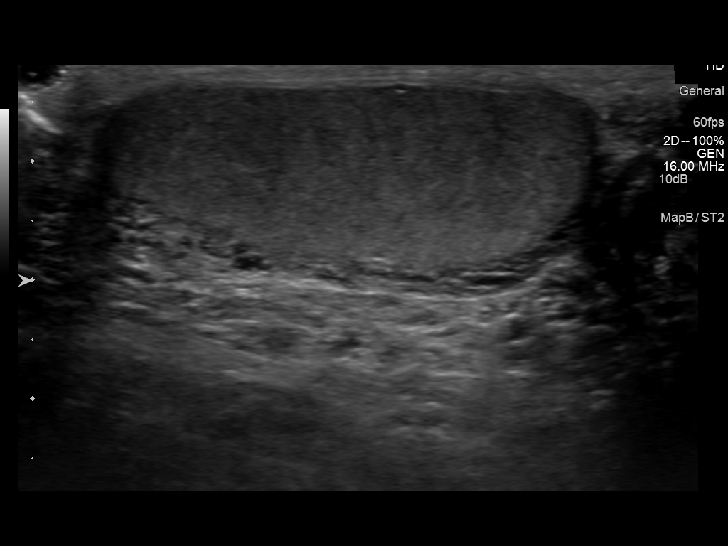
[im 19/55]
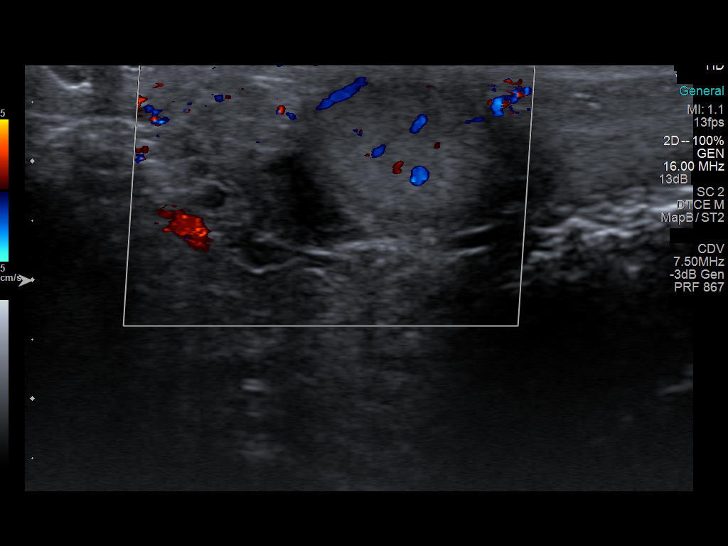
[im 21/55]
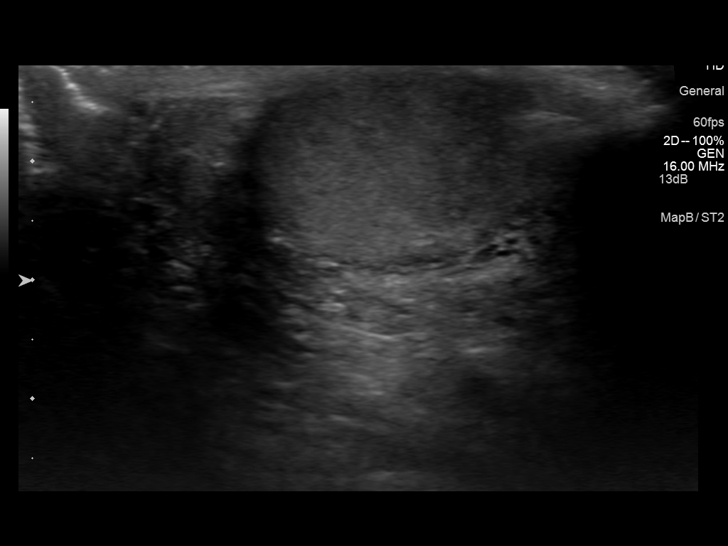
[im 25/55]
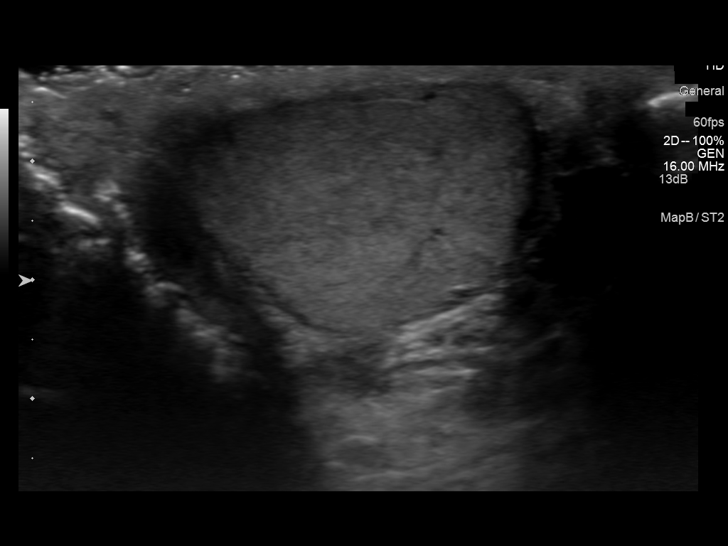
[im 30/55]
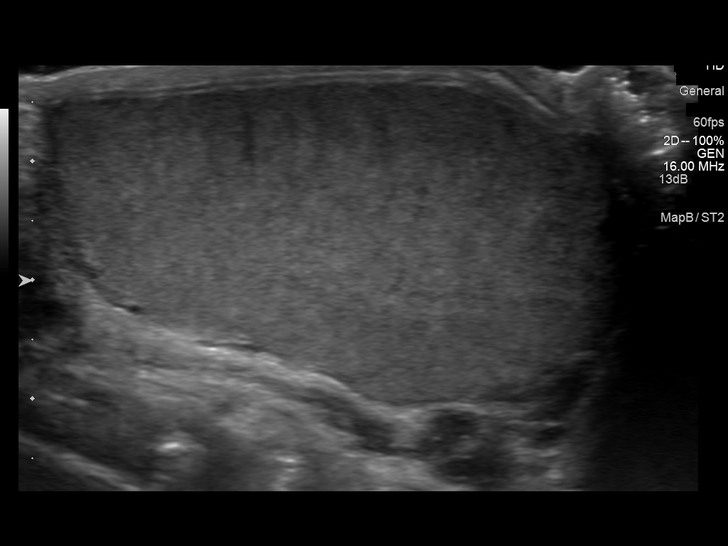
[im 34/55]
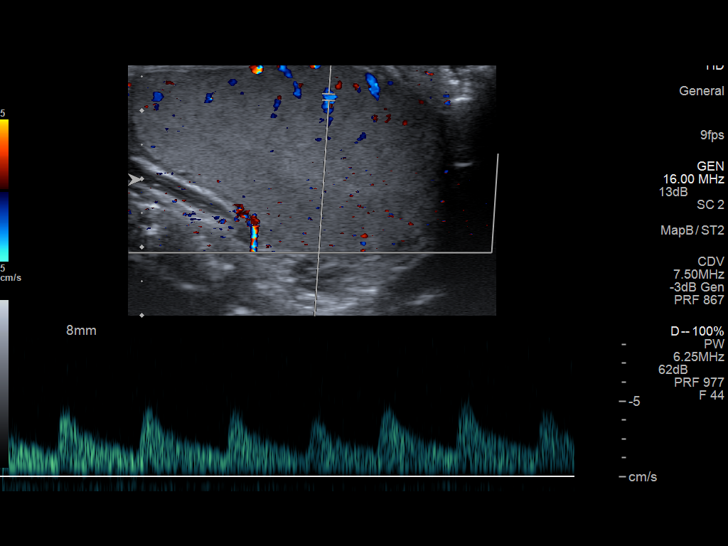
[im 37/55]
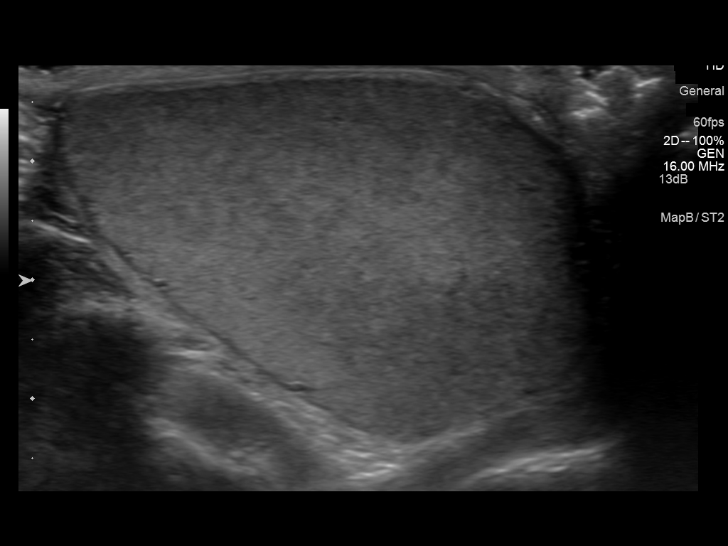
[im 41/55]
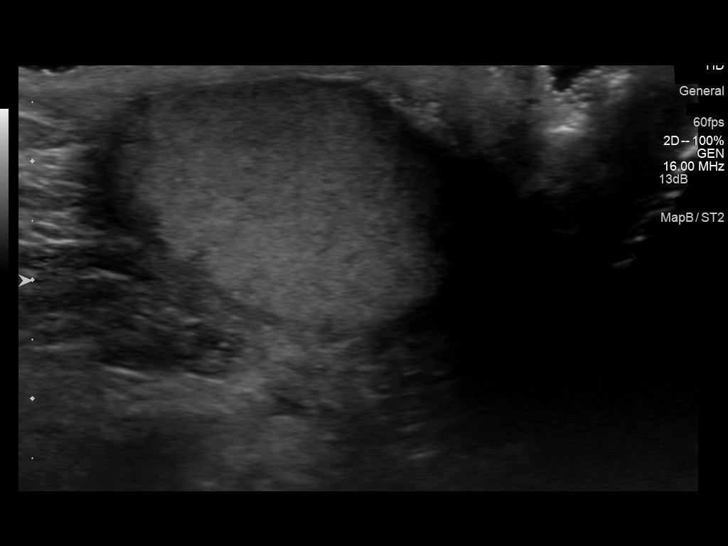
[im 46/55]
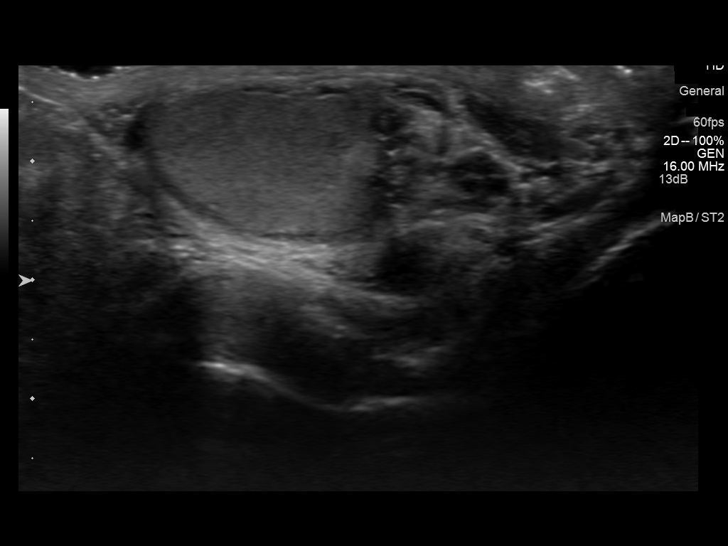
[im 50/55]
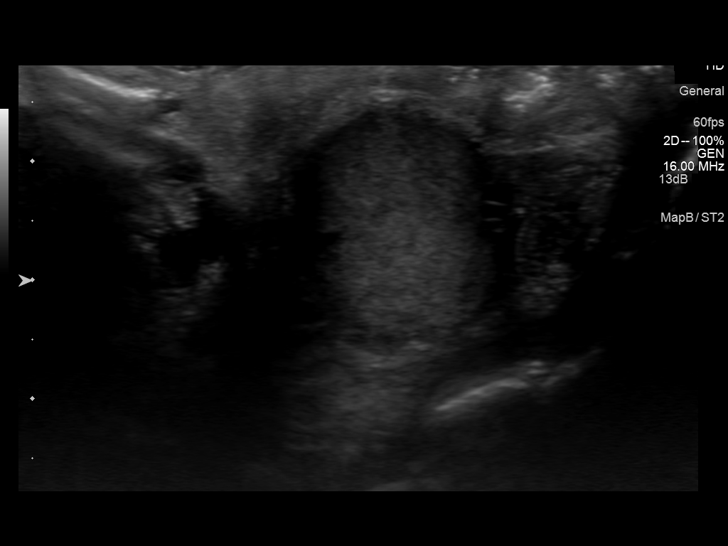
[im 55/55]
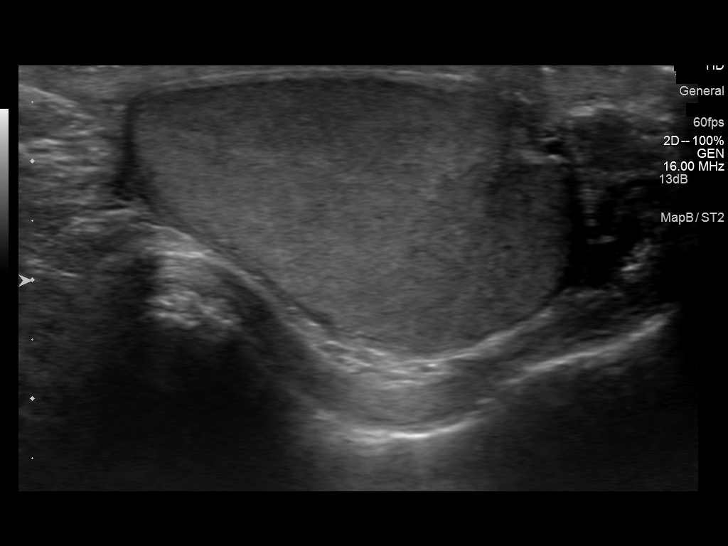

[14 of 25 positions shown; findings below may reference images not displayed]

FINDINGS: Right testicle

Measurements: 55 mm x 41 mm x 24 mm. No mass or microlithiasis
visualized.

Left testicle

Measurements: 49 mm x 32 mm x 26 mm. No mass or microlithiasis
visualized.

Right epididymis:  Normal in size and appearance.

Left epididymis:  Normal in size and appearance.

Hydrocele:  None visualized.

Varicocele:  LEFT varicocele incidentally noted.

Pulsed Doppler interrogation of both testes demonstrates normal low
resistance arterial and venous waveforms bilaterally.
IMPRESSION: 1. Negative for testicular torsion.
2. Small LEFT varicocele.

## 2016-09-16 DIAGNOSIS — Z4651 Encounter for fitting and adjustment of gastric lap band: Secondary | ICD-10-CM | POA: Diagnosis not present

## 2016-09-16 DIAGNOSIS — R1032 Left lower quadrant pain: Secondary | ICD-10-CM | POA: Diagnosis not present

## 2016-12-14 DIAGNOSIS — Z0000 Encounter for general adult medical examination without abnormal findings: Secondary | ICD-10-CM | POA: Diagnosis not present

## 2016-12-14 DIAGNOSIS — Z125 Encounter for screening for malignant neoplasm of prostate: Secondary | ICD-10-CM | POA: Diagnosis not present

## 2016-12-14 DIAGNOSIS — E785 Hyperlipidemia, unspecified: Secondary | ICD-10-CM | POA: Diagnosis not present

## 2016-12-14 DIAGNOSIS — G894 Chronic pain syndrome: Secondary | ICD-10-CM | POA: Diagnosis not present

## 2016-12-14 DIAGNOSIS — Z5181 Encounter for therapeutic drug level monitoring: Secondary | ICD-10-CM | POA: Diagnosis not present

## 2017-02-22 IMAGING — CT CT ABD-PELV W/ CM
2 of 5 series · 15 of 46 positions shown, 17 images · IV contrast (OMNIPAQUE 300)
Comparison: 11/28/2014 CT abdomen/ pelvis.

CLINICAL DATA: Laparoscopic sigmoid colectomy on 11/05/2014. Recent
diagnosis of acute diverticulitis of the distal colon extending to
the distal colonic anastomosis. Worsening lower abdominal pain.
Status post laparoscopic gastric band placement 3 years prior.

EXAM:
CT ABDOMEN AND PELVIS WITH CONTRAST
TECHNIQUE: Multidetector CT imaging of the abdomen and pelvis was performed
using the standard protocol following bolus administration of
intravenous contrast.
CONTRAST:  100mL OMNIPAQUE IOHEXOL 300 MG/ML  SOLN

[Series 2: abd/pel with · axial · 0.74mm/px · z∈[+830,+1256]mm · 12 of 96 slices shown, 14 images]
[im 6/96  soft-tissue]
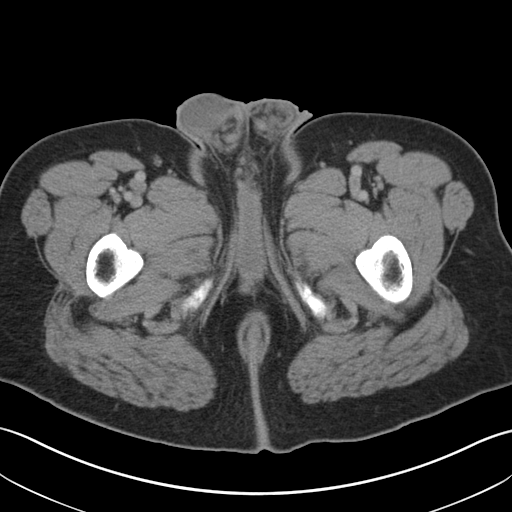
[im 6/96  bone]
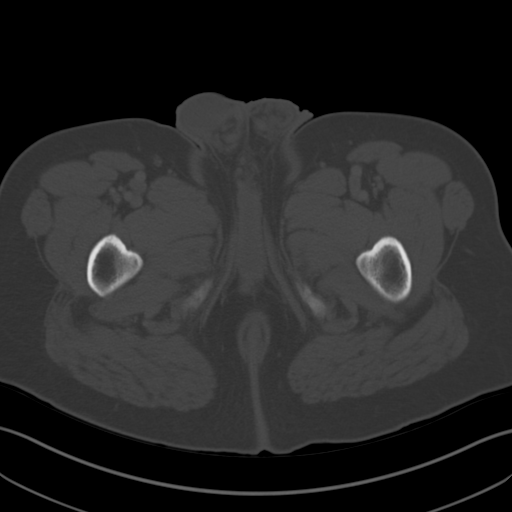
[im 16/96  soft-tissue]
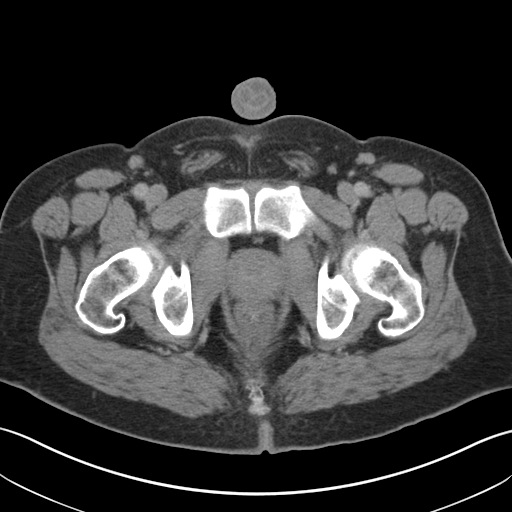
[im 21/96  soft-tissue]
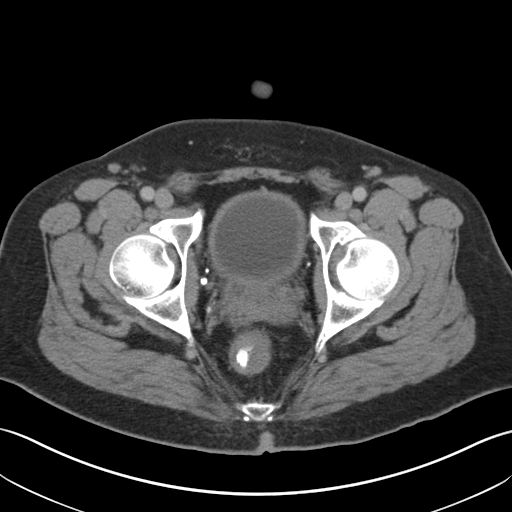
[im 31/96  soft-tissue]
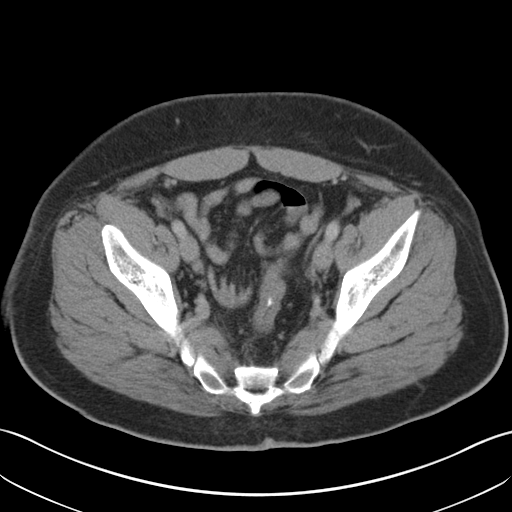
[im 36/96  soft-tissue]
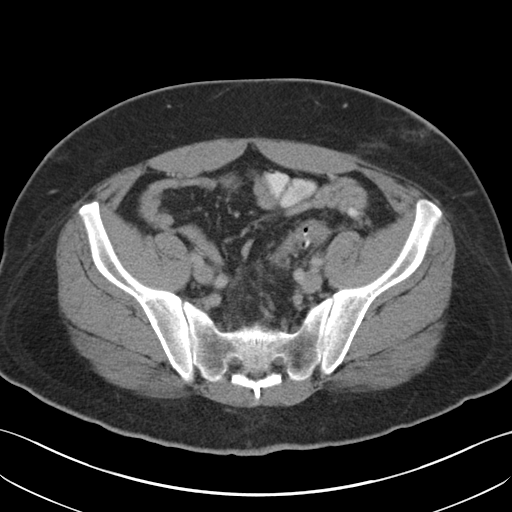
[im 46/96  soft-tissue]
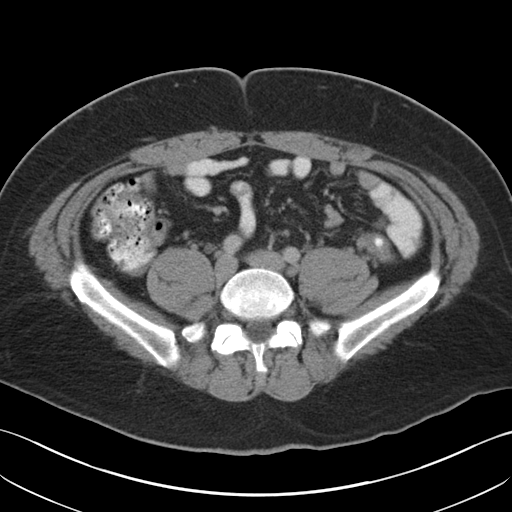
[im 51/96  soft-tissue]
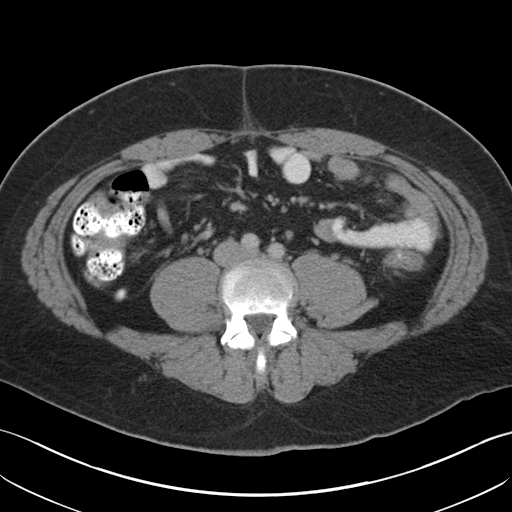
[im 61/96  soft-tissue]
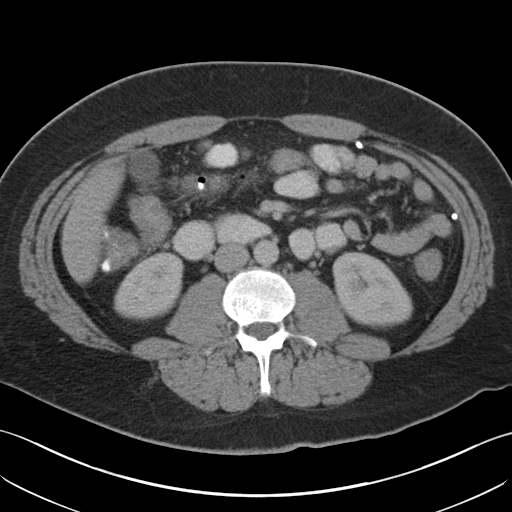
[im 66/96  soft-tissue]
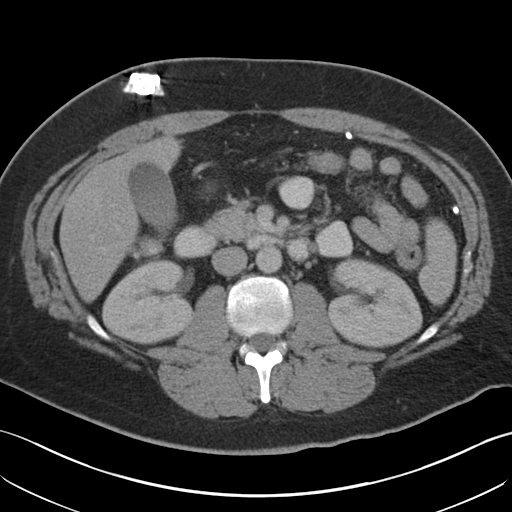
[im 66/96  bone]
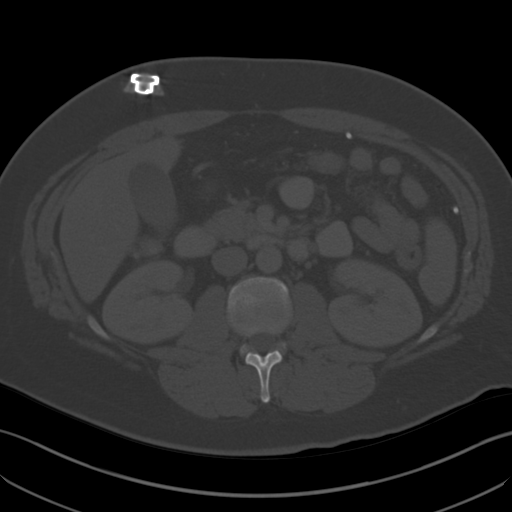
[im 76/96  soft-tissue]
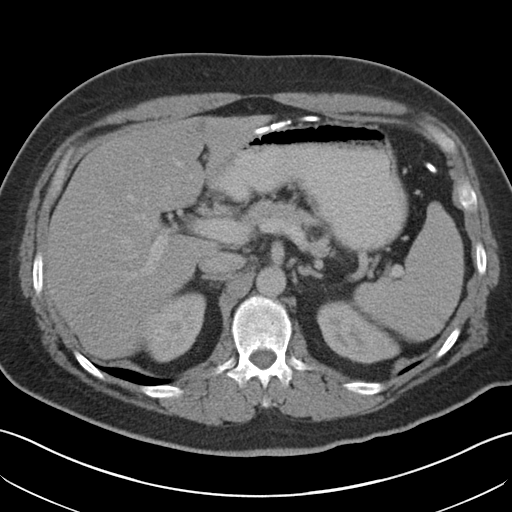
[im 81/96  soft-tissue]
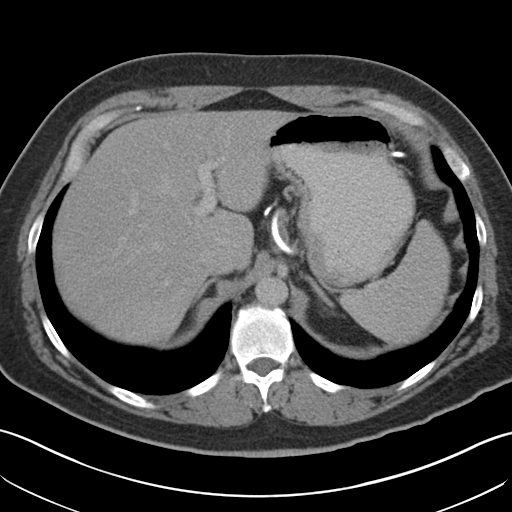
[im 91/96  soft-tissue]
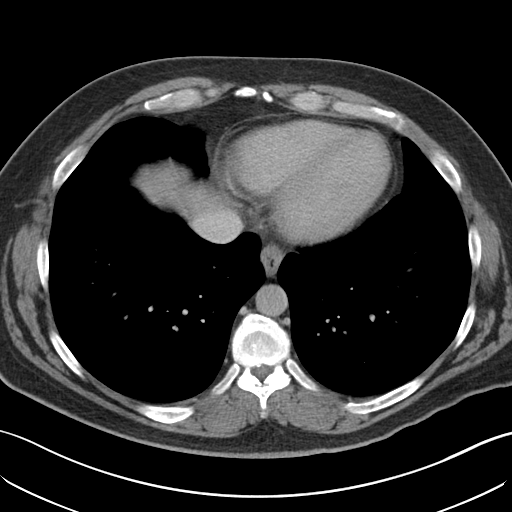

[Series 3: coronal a/|p · coronal · 0.74mm/px · 3 of 109 slices shown]
[im 37/109  soft-tissue]
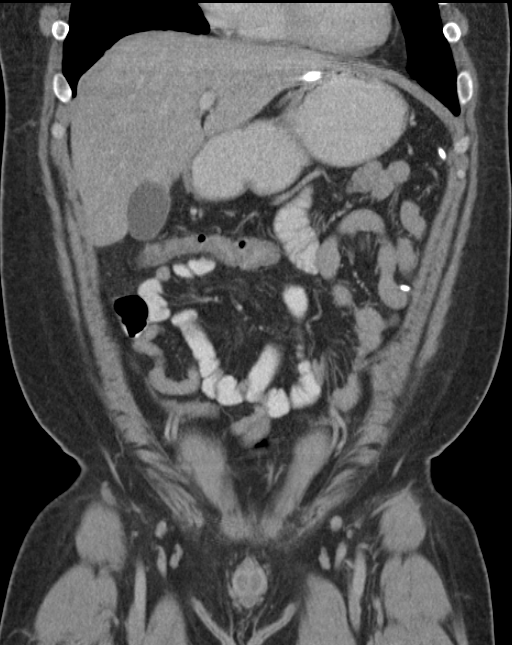
[im 49/109  soft-tissue]
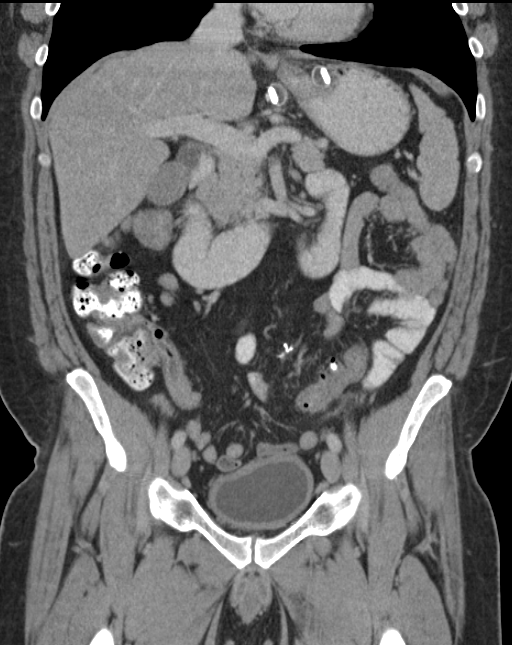
[im 61/109  soft-tissue]
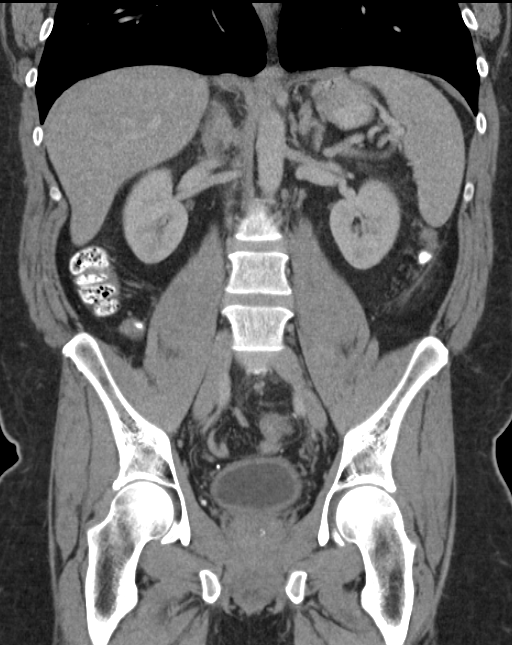

[15 of 46 positions shown; findings below may reference images not displayed]

FINDINGS: Lower chest: No significant pulmonary nodules or acute consolidative
airspace disease.

Hepatobiliary: Normal liver with no liver mass. Subcentimeter
layering calcified gallstones in the nondistended gallbladder, with
no gallbladder wall thickening or pericholecystic fat stranding. No
biliary ductal dilatation.

Pancreas: Normal, with no mass or duct dilation.

Spleen: Normal size. No mass.

Adrenals/Urinary Tract: Normal adrenals. Suggestion of punctate
nonobstructing renal stones in the interpolar kidneys bilaterally.
No hydronephrosis. No renal mass. Stable nonspecific mild diffuse
bladder wall thickening. No perivesical fat stranding.

Stomach/Bowel: Stable normal position of the laparoscopic gastric
band with intact appearing 2 being connecting to the ventral right
abdominal wall subcutaneous port. Otherwise grossly normal stomach.
Normal caliber small bowel with no small bowel wall thickening.
Normal appendix. Status post subtotal sigmoid colectomy with intact
appearing rectosigmoid anastomosis. There is mild residual
pericolonic fat stranding throughout the descending colon extending
to the distal colonic anastomosis, which appears decreased, with no
definite residual colonic wall thickening.

Vascular/Lymphatic: Normal caliber abdominal aorta. Patent portal,
splenic, hepatic and renal veins. No pathologically enlarged lymph
nodes in the abdomen or pelvis.

Reproductive: Normal size prostate.

Other: No pneumoperitoneum, ascites or focal fluid collection.

Musculoskeletal: No aggressive appearing focal osseous lesions.
Stable L3 vertebral hemangioma. Mild degenerative changes in the
visualized thoracolumbar spine.
IMPRESSION: 1. Resolving pericolonic inflammatory changes in the distal colon,
presumably representing improving diverticulitis and/or resolving
postsurgical changes. No evidence of bowel perforation or abscess.
No evidence of bowel obstruction or new sites of bowel inflammation.
Normal appendix.
2. Stable nonspecific mild diffuse bladder wall thickening.
Recommend correlation with urinalysis to exclude a urinary tract
infection.
3. Tiny nonobstructing bilateral renal stones.  No hydronephrosis.
4. Cholelithiasis.

## 2017-06-10 ENCOUNTER — Other Ambulatory Visit (HOSPITAL_COMMUNITY): Payer: Self-pay | Admitting: Internal Medicine

## 2017-06-10 DIAGNOSIS — R109 Unspecified abdominal pain: Secondary | ICD-10-CM | POA: Diagnosis not present

## 2017-06-10 DIAGNOSIS — G43A0 Cyclical vomiting, not intractable: Secondary | ICD-10-CM

## 2017-06-14 ENCOUNTER — Ambulatory Visit (HOSPITAL_COMMUNITY)
Admission: RE | Admit: 2017-06-14 | Discharge: 2017-06-14 | Disposition: A | Discharge status: Home / Self Care | Payer: BLUE CROSS/BLUE SHIELD | Source: Ambulatory Visit | Attending: Internal Medicine | Admitting: Internal Medicine

## 2017-06-14 DIAGNOSIS — Z9049 Acquired absence of other specified parts of digestive tract: Secondary | ICD-10-CM | POA: Diagnosis not present

## 2017-06-14 DIAGNOSIS — G43A0 Cyclical vomiting, not intractable: Secondary | ICD-10-CM | POA: Diagnosis not present

## 2017-06-14 DIAGNOSIS — R112 Nausea with vomiting, unspecified: Secondary | ICD-10-CM | POA: Diagnosis not present

## 2017-06-16 DIAGNOSIS — Z9049 Acquired absence of other specified parts of digestive tract: Secondary | ICD-10-CM | POA: Diagnosis not present

## 2017-06-16 DIAGNOSIS — R112 Nausea with vomiting, unspecified: Secondary | ICD-10-CM | POA: Diagnosis not present

## 2017-06-16 DIAGNOSIS — R197 Diarrhea, unspecified: Secondary | ICD-10-CM | POA: Diagnosis not present

## 2017-06-16 DIAGNOSIS — R109 Unspecified abdominal pain: Secondary | ICD-10-CM | POA: Diagnosis not present

## 2017-06-24 DIAGNOSIS — Z9884 Bariatric surgery status: Secondary | ICD-10-CM | POA: Diagnosis not present

## 2017-06-24 DIAGNOSIS — K6389 Other specified diseases of intestine: Secondary | ICD-10-CM | POA: Diagnosis not present

## 2017-06-24 DIAGNOSIS — K648 Other hemorrhoids: Secondary | ICD-10-CM | POA: Diagnosis not present

## 2017-06-24 DIAGNOSIS — R197 Diarrhea, unspecified: Secondary | ICD-10-CM | POA: Diagnosis not present

## 2017-06-24 DIAGNOSIS — R109 Unspecified abdominal pain: Secondary | ICD-10-CM | POA: Diagnosis not present

## 2017-06-24 DIAGNOSIS — R1084 Generalized abdominal pain: Secondary | ICD-10-CM | POA: Diagnosis not present

## 2017-06-24 DIAGNOSIS — R112 Nausea with vomiting, unspecified: Secondary | ICD-10-CM | POA: Diagnosis not present

## 2017-06-24 DIAGNOSIS — Z980 Intestinal bypass and anastomosis status: Secondary | ICD-10-CM | POA: Diagnosis not present

## 2017-06-25 DIAGNOSIS — R112 Nausea with vomiting, unspecified: Secondary | ICD-10-CM | POA: Diagnosis not present

## 2017-06-25 DIAGNOSIS — R197 Diarrhea, unspecified: Secondary | ICD-10-CM | POA: Diagnosis not present

## 2017-06-28 DIAGNOSIS — J301 Allergic rhinitis due to pollen: Secondary | ICD-10-CM | POA: Diagnosis not present

## 2017-06-28 DIAGNOSIS — Z0000 Encounter for general adult medical examination without abnormal findings: Secondary | ICD-10-CM | POA: Diagnosis not present

## 2017-06-28 DIAGNOSIS — R110 Nausea: Secondary | ICD-10-CM | POA: Diagnosis not present

## 2018-01-20 ENCOUNTER — Emergency Department (HOSPITAL_BASED_OUTPATIENT_CLINIC_OR_DEPARTMENT_OTHER): Payer: BLUE CROSS/BLUE SHIELD

## 2018-01-20 ENCOUNTER — Encounter (HOSPITAL_BASED_OUTPATIENT_CLINIC_OR_DEPARTMENT_OTHER): Payer: Self-pay | Admitting: Emergency Medicine

## 2018-01-20 ENCOUNTER — Emergency Department (HOSPITAL_BASED_OUTPATIENT_CLINIC_OR_DEPARTMENT_OTHER)
Admission: EM | Admit: 2018-01-20 | Discharge: 2018-01-20 | Disposition: A | Discharge status: Home / Self Care | Payer: BLUE CROSS/BLUE SHIELD | Attending: Emergency Medicine | Admitting: Emergency Medicine

## 2018-01-20 ENCOUNTER — Other Ambulatory Visit: Payer: Self-pay

## 2018-01-20 DIAGNOSIS — Z9884 Bariatric surgery status: Secondary | ICD-10-CM | POA: Insufficient documentation

## 2018-01-20 DIAGNOSIS — S0990XA Unspecified injury of head, initial encounter: Secondary | ICD-10-CM | POA: Diagnosis not present

## 2018-01-20 DIAGNOSIS — Y92410 Unspecified street and highway as the place of occurrence of the external cause: Secondary | ICD-10-CM | POA: Insufficient documentation

## 2018-01-20 DIAGNOSIS — S199XXA Unspecified injury of neck, initial encounter: Secondary | ICD-10-CM | POA: Diagnosis not present

## 2018-01-20 DIAGNOSIS — M542 Cervicalgia: Secondary | ICD-10-CM | POA: Insufficient documentation

## 2018-01-20 DIAGNOSIS — S4991XA Unspecified injury of right shoulder and upper arm, initial encounter: Secondary | ICD-10-CM | POA: Diagnosis not present

## 2018-01-20 DIAGNOSIS — S060X0A Concussion without loss of consciousness, initial encounter: Secondary | ICD-10-CM | POA: Diagnosis not present

## 2018-01-20 DIAGNOSIS — Z87891 Personal history of nicotine dependence: Secondary | ICD-10-CM | POA: Insufficient documentation

## 2018-01-20 DIAGNOSIS — Y9389 Activity, other specified: Secondary | ICD-10-CM | POA: Insufficient documentation

## 2018-01-20 DIAGNOSIS — M25511 Pain in right shoulder: Secondary | ICD-10-CM | POA: Diagnosis not present

## 2018-01-20 DIAGNOSIS — Y999 Unspecified external cause status: Secondary | ICD-10-CM | POA: Diagnosis not present

## 2018-01-20 MED ORDER — METHOCARBAMOL 500 MG PO TABS: 500.0000 mg | ORAL_TABLET | Freq: Every evening | ORAL | 0 refills | Status: DC | PRN

## 2018-01-20 MED ORDER — ACETAMINOPHEN 325 MG PO TABS
650.0000 mg | ORAL_TABLET | Freq: Once | ORAL | Status: AC
  Administered 2018-01-20: 650 mg via ORAL
  Filled 2018-01-20: qty 2
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
## 2018-02-22 DIAGNOSIS — G894 Chronic pain syndrome: Secondary | ICD-10-CM | POA: Diagnosis not present

## 2018-02-22 DIAGNOSIS — M25562 Pain in left knee: Secondary | ICD-10-CM | POA: Diagnosis not present

## 2018-02-22 DIAGNOSIS — R1032 Left lower quadrant pain: Secondary | ICD-10-CM | POA: Diagnosis not present

## 2018-06-08 DIAGNOSIS — E785 Hyperlipidemia, unspecified: Secondary | ICD-10-CM | POA: Diagnosis not present

## 2018-06-08 DIAGNOSIS — Z0000 Encounter for general adult medical examination without abnormal findings: Secondary | ICD-10-CM | POA: Diagnosis not present

## 2018-06-08 DIAGNOSIS — R739 Hyperglycemia, unspecified: Secondary | ICD-10-CM | POA: Diagnosis not present

## 2018-06-08 DIAGNOSIS — Z125 Encounter for screening for malignant neoplasm of prostate: Secondary | ICD-10-CM | POA: Diagnosis not present

## 2018-06-08 DIAGNOSIS — Z79891 Long term (current) use of opiate analgesic: Secondary | ICD-10-CM | POA: Diagnosis not present

## 2018-06-17 DIAGNOSIS — R739 Hyperglycemia, unspecified: Secondary | ICD-10-CM | POA: Diagnosis not present

## 2018-06-17 DIAGNOSIS — Z0000 Encounter for general adult medical examination without abnormal findings: Secondary | ICD-10-CM | POA: Diagnosis not present

## 2018-12-12 DIAGNOSIS — Z0000 Encounter for general adult medical examination without abnormal findings: Secondary | ICD-10-CM | POA: Diagnosis not present

## 2018-12-12 DIAGNOSIS — Z125 Encounter for screening for malignant neoplasm of prostate: Secondary | ICD-10-CM | POA: Diagnosis not present

## 2018-12-12 DIAGNOSIS — I10 Essential (primary) hypertension: Secondary | ICD-10-CM | POA: Diagnosis not present

## 2018-12-12 DIAGNOSIS — E039 Hypothyroidism, unspecified: Secondary | ICD-10-CM | POA: Diagnosis not present

## 2018-12-12 DIAGNOSIS — E785 Hyperlipidemia, unspecified: Secondary | ICD-10-CM | POA: Diagnosis not present

## 2018-12-27 DIAGNOSIS — K219 Gastro-esophageal reflux disease without esophagitis: Secondary | ICD-10-CM | POA: Diagnosis not present

## 2018-12-27 DIAGNOSIS — G43A0 Cyclical vomiting, not intractable: Secondary | ICD-10-CM | POA: Diagnosis not present

## 2018-12-27 DIAGNOSIS — K589 Irritable bowel syndrome without diarrhea: Secondary | ICD-10-CM | POA: Diagnosis not present

## 2018-12-27 DIAGNOSIS — G894 Chronic pain syndrome: Secondary | ICD-10-CM | POA: Diagnosis not present

## 2019-06-29 DIAGNOSIS — G894 Chronic pain syndrome: Secondary | ICD-10-CM | POA: Diagnosis not present

## 2019-06-29 DIAGNOSIS — E7800 Pure hypercholesterolemia, unspecified: Secondary | ICD-10-CM | POA: Diagnosis not present

## 2019-06-29 DIAGNOSIS — Z0000 Encounter for general adult medical examination without abnormal findings: Secondary | ICD-10-CM | POA: Diagnosis not present

## 2019-07-06 DIAGNOSIS — Z0000 Encounter for general adult medical examination without abnormal findings: Secondary | ICD-10-CM | POA: Diagnosis not present

## 2019-09-07 DIAGNOSIS — H60332 Swimmer's ear, left ear: Secondary | ICD-10-CM | POA: Diagnosis not present

## 2019-09-11 DIAGNOSIS — H60332 Swimmer's ear, left ear: Secondary | ICD-10-CM | POA: Diagnosis not present

## 2019-11-08 DIAGNOSIS — Z23 Encounter for immunization: Secondary | ICD-10-CM | POA: Diagnosis not present

## 2019-12-06 ENCOUNTER — Emergency Department (HOSPITAL_COMMUNITY): Payer: BC Managed Care – PPO

## 2019-12-06 ENCOUNTER — Encounter (HOSPITAL_COMMUNITY): Payer: Self-pay

## 2019-12-06 ENCOUNTER — Emergency Department (HOSPITAL_COMMUNITY)
Admission: EM | Admit: 2019-12-06 | Discharge: 2019-12-06 | Disposition: A | Discharge status: Home / Self Care | Payer: BC Managed Care – PPO | Attending: Emergency Medicine | Admitting: Emergency Medicine

## 2019-12-06 DIAGNOSIS — R1032 Left lower quadrant pain: Secondary | ICD-10-CM | POA: Diagnosis not present

## 2019-12-06 DIAGNOSIS — R110 Nausea: Secondary | ICD-10-CM | POA: Insufficient documentation

## 2019-12-06 DIAGNOSIS — R109 Unspecified abdominal pain: Secondary | ICD-10-CM | POA: Diagnosis not present

## 2019-12-06 DIAGNOSIS — N200 Calculus of kidney: Secondary | ICD-10-CM

## 2019-12-06 DIAGNOSIS — Z87891 Personal history of nicotine dependence: Secondary | ICD-10-CM | POA: Diagnosis not present

## 2019-12-06 DIAGNOSIS — R1110 Vomiting, unspecified: Secondary | ICD-10-CM | POA: Diagnosis not present

## 2019-12-06 DIAGNOSIS — N433 Hydrocele, unspecified: Secondary | ICD-10-CM | POA: Diagnosis not present

## 2019-12-06 DIAGNOSIS — N50812 Left testicular pain: Secondary | ICD-10-CM | POA: Diagnosis not present

## 2019-12-06 DIAGNOSIS — R112 Nausea with vomiting, unspecified: Secondary | ICD-10-CM | POA: Diagnosis not present

## 2019-12-06 DIAGNOSIS — Z20822 Contact with and (suspected) exposure to covid-19: Secondary | ICD-10-CM | POA: Insufficient documentation

## 2019-12-06 LAB — URINALYSIS, ROUTINE W REFLEX MICROSCOPIC
Bilirubin Urine: NEGATIVE
Glucose, UA: NEGATIVE mg/dL
Ketones, ur: NEGATIVE mg/dL
Leukocytes,Ua: NEGATIVE
Nitrite: NEGATIVE
Protein, ur: NEGATIVE mg/dL
Specific Gravity, Urine: 1.02 (ref 1.005–1.030)
pH: 7 (ref 5.0–8.0)

## 2019-12-06 LAB — CBC WITH DIFFERENTIAL/PLATELET
Abs Immature Granulocytes: 0.03 10*3/uL (ref 0.00–0.07)
Basophils Absolute: 0 10*3/uL (ref 0.0–0.1)
Basophils Relative: 0 %
Eosinophils Absolute: 0 10*3/uL (ref 0.0–0.5)
Eosinophils Relative: 0 %
HCT: 44.3 % (ref 39.0–52.0)
Hemoglobin: 15.1 g/dL (ref 13.0–17.0)
Immature Granulocytes: 0 %
Lymphocytes Relative: 10 %
Lymphs Abs: 1.1 10*3/uL (ref 0.7–4.0)
MCH: 31.7 pg (ref 26.0–34.0)
MCHC: 34.1 g/dL (ref 30.0–36.0)
MCV: 92.9 fL (ref 80.0–100.0)
Monocytes Absolute: 0.3 10*3/uL (ref 0.1–1.0)
Monocytes Relative: 3 %
Neutro Abs: 9.5 10*3/uL — ABNORMAL HIGH (ref 1.7–7.7)
Neutrophils Relative %: 87 %
Platelets: 234 10*3/uL (ref 150–400)
RBC: 4.77 MIL/uL (ref 4.22–5.81)
RDW: 11.9 % (ref 11.5–15.5)
WBC: 10.9 10*3/uL — ABNORMAL HIGH (ref 4.0–10.5)
nRBC: 0 % (ref 0.0–0.2)

## 2019-12-06 LAB — RESPIRATORY PANEL BY RT PCR (FLU A&B, COVID)
Influenza A by PCR: NEGATIVE
Influenza B by PCR: NEGATIVE
SARS Coronavirus 2 by RT PCR: NEGATIVE

## 2019-12-06 LAB — COMPREHENSIVE METABOLIC PANEL
ALT: 22 U/L (ref 0–44)
AST: 28 U/L (ref 15–41)
Albumin: 4.2 g/dL (ref 3.5–5.0)
Alkaline Phosphatase: 84 U/L (ref 38–126)
Anion gap: 10 (ref 5–15)
BUN: 16 mg/dL (ref 6–20)
CO2: 24 mmol/L (ref 22–32)
Calcium: 8.8 mg/dL — ABNORMAL LOW (ref 8.9–10.3)
Chloride: 104 mmol/L (ref 98–111)
Creatinine, Ser: 0.91 mg/dL (ref 0.61–1.24)
GFR, Estimated: >60 mL/min (ref >60)
Glucose, Bld: 138 mg/dL — ABNORMAL HIGH (ref 70–99)
Potassium: 4.4 mmol/L (ref 3.5–5.1)
Sodium: 138 mmol/L (ref 135–145)
Total Bilirubin: 0.7 mg/dL (ref 0.3–1.2)
Total Protein: 7.6 g/dL (ref 6.5–8.1)

## 2019-12-06 LAB — LACTIC ACID, PLASMA: Lactic Acid, Venous: 1.4 mmol/L (ref 0.5–1.9)

## 2019-12-06 LAB — LIPASE, BLOOD: Lipase: 20 U/L (ref 11–51)

## 2019-12-06 MED ORDER — IOHEXOL 300 MG/ML  SOLN
100.0000 mL | Freq: Once | INTRAMUSCULAR | Status: AC | PRN
  Administered 2019-12-06: 100 mL via INTRAVENOUS

## 2019-12-06 MED ORDER — ONDANSETRON HCL 4 MG PO TABS: 4.0000 mg | ORAL_TABLET | Freq: Four times a day (QID) | ORAL | 0 refills | Status: DC

## 2019-12-06 MED ORDER — KETOROLAC TROMETHAMINE 15 MG/ML IJ SOLN
15.0000 mg | Freq: Once | INTRAMUSCULAR | Status: AC
  Administered 2019-12-06: 15 mg via INTRAVENOUS
  Filled 2019-12-06: qty 1

## 2019-12-06 MED ORDER — HYDROMORPHONE HCL 1 MG/ML IJ SOLN
1.0000 mg | Freq: Once | INTRAMUSCULAR | Status: AC
  Administered 2019-12-06: 1 mg via INTRAVENOUS
  Filled 2019-12-06: qty 1

## 2019-12-06 MED ORDER — HYDROCODONE-ACETAMINOPHEN 5-325 MG PO TABS: 1.0000 | ORAL_TABLET | Freq: Four times a day (QID) | ORAL | 0 refills | Status: DC | PRN

## 2019-12-06 MED ORDER — TAMSULOSIN HCL 0.4 MG PO CAPS: 0.4000 mg | ORAL_CAPSULE | Freq: Every day | ORAL | 0 refills | Status: AC

## 2019-12-06 MED ORDER — SODIUM CHLORIDE 0.9 % IV BOLUS
1000.0000 mL | Freq: Once | INTRAVENOUS | Status: AC
  Administered 2019-12-06: 1000 mL via INTRAVENOUS

## 2019-12-06 MED ORDER — ONDANSETRON HCL 4 MG/2ML IJ SOLN
4.0000 mg | Freq: Once | INTRAMUSCULAR | Status: AC
  Administered 2019-12-06: 4 mg via INTRAVENOUS
  Filled 2019-12-06: qty 2

## 2019-12-06 NOTE — Discharge Instructions (Addendum)
I would hold tramadol and use Norco for pain control at this time in addition to ibuprofen/Motrin.  Take Zofran as needed for nausea.  Take Flomax to help promote expulsion of kidney stone.  Please follow-up with urology.  Please return to ED if symptoms worsen including fever.

## 2019-12-06 NOTE — ED Provider Notes (Signed)
[facility]-EMERGENCY DEPT Provider Note   CSN: [id number] Arrival date & time: 12/06/19  4098     History Chief Complaint  Patient presents with  . Groin Pain  . Abdominal Pain  . Nausea    Dalin Caldera is a 44 y.o. male.  The history is provided by the patient.  Abdominal Pain Pain location:  LLQ Pain radiates to:  Scrotum Pain severity:  Moderate Onset quality:  Sudden Timing:  Constant Progression:  Unchanged Chronicity:  New Relieved by:  Nothing Worsened by:  Nothing Associated symptoms: diarrhea, nausea and vomiting   Associated symptoms: no anorexia, no belching, no chest pain, no chills, no cough, no dysuria, no fever, no hematuria, no shortness of breath and no sore throat   Risk factors: multiple surgeries        Past Medical History:  Diagnosis Date  . Arthritis   . Diverticulitis   . Kidney stones     Patient Active Problem List   Diagnosis Date Noted  . Post-operative pain 11/30/2014  . Testicular/scrotal pain 06/05/2014  . Metabolic acidosis with normal anion gap and bicarbonate losses 06/05/2014  . Nausea & vomiting 06/05/2014  . AP (abdominal pain)   . Nausea vomiting and diarrhea   . Acute gastroenteritis 06/03/2014  . 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 Morbid obesity (HCC) 09/04/2010    Past Surgical History:  Procedure Laterality Date  . LAPAROSCOPIC GASTRIC BANDING  11/18/10  . LAPAROSCOPIC SIGMOID COLECTOMY N/A 11/05/2014   Procedure: LAPAROSCOPIC SIGMOID COLECTOMY;  Surgeon: [name], MD;  Location: [facility]S;  Service: General;  Laterality: N/A;  . TONSILLECTOMY    . VASECTOMY  2009       Family History  Problem Relation Age of Onset  . Heart disease Mother   . Kidney Stones Other   . Gallstones Other     Social History   Tobacco Use  . Smoking status: Former Smoker    Packs/day: 0.25    Types: Cigarettes    Quit date: 01/26/2013    Years since quitting: 6.8  . Smokeless tobacco:  Never Used  Substance Use Topics  . Alcohol use: Yes    Comment: occasional / NONE SINCE JUNE  . Drug use: No    Home Medications Prior to Admission medications   Medication Sig Start Date End Date Taking? Authorizing Provider  hyoscyamine (LEVSIN/SL) 0.125 MG SL tablet Place 0.125 mg under the tongue every 4 (four) hours as needed for cramping.  04/22/15  Yes [provider]  Ibuprofen-diphenhydrAMINE Cit (ADVIL PM PO) Take 2 tablets by mouth daily as needed (pain).   Yes [provider]  ondansetron (ZOFRAN ODT) 8 MG disintegrating tablet Take 1 tablet (8 mg total) by mouth every 8 (eight) hours as needed for refractory nausea / vomiting.  ODT q4 hours prn nausea Patient taking differently: Take 8 mg by mouth every 8 (eight) hours as needed for nausea or vomiting.  08/02/15  Yes Zadie Rhine, MD  pantoprazole (PROTONIX) 40 MG tablet Take 40 mg by mouth daily.   Yes [provider]  traMADol (ULTRAM) 50 MG tablet Take 50-100 mg by mouth every 8 (eight) hours as needed for moderate pain or severe pain.  07/03/10  Yes [provider]  HYDROcodone-acetaminophen (NORCO) 5-325 MG tablet Take 1 tablet by mouth every 6 (six) hours as needed for up to 20 doses for severe pain. 12/06/19   Randell Teare, DO  methocarbamol (ROBAXIN) 500 MG tablet Take 1  tablet (500 mg total) by mouth at bedtime as needed for muscle spasms. Patient not taking: Reported on 12/06/2019 01/20/18   Maczis, Elmer SowMichael M, PA-C  ondansetron (ZOFRAN) 4 MG tablet Take 1 tablet (4 mg total) by mouth every 6 (six) hours. 12/06/19   Bennett Vanscyoc, DO  tamsulosin (FLOMAX) 0.4 MG CAPS capsule Take 1 capsule (0.4 mg total) by mouth daily for 5 days. 12/06/19 12/11/19  Virgina Norfolkuratolo, Violeta Lecount, DO    Allergies    Patient has no known allergies.  Review of Systems   Review of Systems  Constitutional: Negative for chills and fever.  HENT: Negative for ear pain and sore throat.   Eyes: Negative for pain and  visual disturbance.  Respiratory: Negative for cough and shortness of breath.   Cardiovascular: Negative for chest pain and palpitations.  Gastrointestinal: Positive for abdominal pain, diarrhea, nausea and vomiting. Negative for anorexia.  Genitourinary: Negative for dysuria and hematuria.  Musculoskeletal: Negative for arthralgias and back pain.  Skin: Negative for color change and rash.  Neurological: Negative for seizures and syncope.  All other systems reviewed and are negative.   Physical Exam Updated Vital Signs BP 127/72   Pulse 91   Temp 99 F (37.2 C) (Oral)   Resp 17   SpO2 97%   Physical Exam Vitals and nursing note reviewed.  Constitutional:      General: He is in acute distress.     Appearance: He is well-developed. He is ill-appearing.  HENT:     Head: Normocephalic and atraumatic.     Mouth/Throat:     Mouth: Mucous membranes are moist.  Eyes:     Extraocular Movements: Extraocular movements intact.     Conjunctiva/sclera: Conjunctivae normal.     Pupils: Pupils are equal, round, and reactive to light.  Cardiovascular:     Rate and Rhythm: Normal rate and regular rhythm.     Heart sounds: Normal heart sounds. No murmur heard.   Pulmonary:     Effort: Pulmonary effort is normal. No respiratory distress.     Breath sounds: Normal breath sounds.  Abdominal:     Palpations: Abdomen is soft.     Tenderness: There is abdominal tenderness in the left lower quadrant. There is no guarding or rebound.     Hernia: No hernia is present.  Genitourinary:    Penis: Normal.      Testes: Normal. Cremasteric reflex is present.        Right: Mass or tenderness not present.        Left: Mass, tenderness or swelling not present.     Prostate: Normal.     Rectum: Normal.  Musculoskeletal:     Cervical back: Neck supple.  Skin:    General: Skin is warm and dry.     Capillary Refill: Capillary refill takes less than 2 seconds.  Neurological:     General: No focal  deficit present.     Mental Status: He is alert.  Psychiatric:        Mood and Affect: Mood normal.     ED Results / Procedures / Treatments   Labs (all labs ordered are listed, but only abnormal results are displayed) Labs Reviewed  COMPREHENSIVE METABOLIC PANEL - Abnormal; Notable for the following components:      Result Value   Glucose, Bld 138 (*)    Calcium 8.8 (*)    All other components within normal limits  URINALYSIS, ROUTINE W REFLEX MICROSCOPIC - Abnormal; Notable for the following  components:   Hgb urine dipstick SMALL (*)    Bacteria, UA RARE (*)    All other components within normal limits  CBC WITH DIFFERENTIAL/PLATELET - Abnormal; Notable for the following components:   WBC 10.9 (*)    Neutro Abs 9.5 (*)    All other components within normal limits  RESPIRATORY PANEL BY RT PCR (FLU A&B, COVID)  LIPASE, BLOOD  LACTIC ACID, PLASMA    EKG None  Radiology CT Abdomen Pelvis W Contrast  Result Date: 12/06/2019 CLINICAL DATA:  Lower abdominal pain with nausea and vomiting EXAM: CT ABDOMEN AND PELVIS WITH CONTRAST TECHNIQUE: Multidetector CT imaging of the abdomen and pelvis was performed using the standard protocol following bolus administration of intravenous contrast. CONTRAST:  OMNIPAQUE IOHEXOL 300 MG/ML  SOLN COMPARISON:  January 03, 2016 FINDINGS: Lower chest: There is bibasilar atelectasis. There is no lung base edema or consolidation. Mild dilatation of the distal esophagus, potentially exacerbated by presence of lap band. Hepatobiliary: No focal liver lesions are appreciable. The gallbladder is absent. There is no appreciable biliary duct dilatation. Pancreas: There is no evident pancreatic mass or inflammatory focus. Spleen: No splenic lesions are evident. Adrenals/Urinary Tract: Adrenals bilaterally appear normal. There is left renal/perinephric edema. There is no evident renal mass on either side. There is mild to moderate hydronephrosis on the left.  There is no hydronephrosis on the right. There is no intrarenal calculus on either side. There is a focal 5 x 4 mm calculus in the left ureter at the L4 level. No other ureteral calculi are appreciable. There are several pelvic phleboliths near but separate from the distal left ureter. Urinary bladder is midline with wall thickness within normal limits. Stomach/Bowel: Lap band present at gastric cardia. No appreciable gastric dilatation or gastric wall thickening. Postoperative changes noted in the mid sigmoid region with localized air immediately posterior to the area of anastomosis. There are multiple proximal to mid sigmoid diverticula without appreciable inflammation to suggest diverticulitis. Elsewhere, no bowel wall thickening. No bowel obstruction. Terminal ileum appears normal. No free air or portal venous air. Vascular/Lymphatic: There is no abdominal aortic aneurysm. Minimal calcification noted in the right common iliac artery. No other arterial vascular lesion. Major venous structures appear patent. No evident adenopathy in the abdomen or pelvis. Reproductive: Prostate and seminal vesicles are normal in size and contour. Other: The appendix appears normal. No evident abscess or ascites in the abdomen or pelvis. Musculoskeletal: No blastic or lytic bone lesions. No intramuscular or abdominal wall lesions. IMPRESSION: 1. 5 x 4 mm calculus in the left ureter at the L4 level causing mild to moderate hydronephrosis on the left. There is left renal/perinephric edema. 2. Sigmoid diverticulosis without frank diverticulitis. Anastomosis patent in the mid to distal sigmoid level with mild dilatation of the distal sigmoid due to focal air immediately distal to the anastomosis. 3. Lap band at gastric cardia. Mild dilatation of the distal esophagus is likely due to the presence of the lap band. 4. No bowel obstruction. No abscess in the abdomen or pelvis. Appendix appears normal. Electronically Signed   By: Bretta Bang III M.D.   On: 12/06/2019 12:14   US SCROTUM W/DOPPLER  Result Date: 12/06/2019 CLINICAL DATA:  Acute left testicular pain. EXAM: SCROTAL ULTRASOUND DOPPLER ULTRASOUND OF THE TESTICLES TECHNIQUE: Complete ultrasound examination of the testicles, epididymis, and other scrotal structures was performed. Color and spectral Doppler ultrasound were also utilized to evaluate blood flow to the testicles. COMPARISON:  Jun 05, 2014. FINDINGS: Right testicle Measurements: 5.4 x 3.2 x 2.7 cm. No mass or microlithiasis visualized. Left testicle Measurements: 5.9 x 3.4 x 2.3 cm. No mass or microlithiasis visualized. Right epididymis:  Normal in size and appearance. Left epididymis:  Normal in size and appearance. Hydrocele:  Small left hydrocele is noted. Varicocele:  None visualized. Pulsed Doppler interrogation of both testes demonstrates normal low resistance arterial and venous waveforms bilaterally. IMPRESSION: Small left hydrocele is noted. No evidence of testicular mass or torsion. Electronically Signed   By: Lupita Raider M.D.   On: 12/06/2019 10:19    Procedures Procedures (including critical care time)  Medications Ordered in ED Medications  sodium chloride 0.9 % bolus 1,000 mL (0 mLs Intravenous Stopped 12/06/19 1149)  HYDROmorphone (DILAUDID) injection 1 mg (1 mg Intravenous Given 12/06/19 0942)  ondansetron (ZOFRAN) injection 4 mg (4 mg Intravenous Given 12/06/19 0942)  iohexol (OMNIPAQUE) 300 MG/ML solution 100 mL (100 mLs Intravenous Contrast Given 12/06/19 1144)  HYDROmorphone (DILAUDID) injection 1 mg (1 mg Intravenous Given 12/06/19 1255)  ketorolac (TORADOL) 15 MG/ML injection 15 mg (15 mg Intravenous Given 12/06/19 1255)    ED Course  I have reviewed the triage vital signs and the nursing notes.  Pertinent labs & imaging results that were available during my care of the patient were reviewed by me and considered in my medical decision making (see chart for details).    MDM  Rules/Calculators/A&P                          Devario Bucklew is a 44 year old male who presents to the ED with left lower quadrant abdominal pain.  Normal vitals.  No fever.  Pain suddenly started overnight in the left lower quadrant radiating to his testicle.  Similar to pain episodes in the past.  Has had diverticulitis, kidney stones.  Has had sigmoid resection in the past.  Has had some diarrhea.  Nausea and vomiting as well.  Will get lab work including CT scan abdomen pelvis as well as scrotal ultrasound.  Could be kidney stones versus diverticulitis versus torsion versus other intra-abdominal process.  Testicular exam is overall unremarkable.  No significant leukocytosis, anemia, electrolyte abnormality.  No kidney injury.  Ultrasound negative for torsion or infection of the testicles.  CT scan did show 5 x 4 mm calculus in the left ureter.  No diverticulitis.  Otherwise CT scan was unremarkable.  Will give another round of pain medications as I am awaiting urinalysis but anticipate hopeful admission to home with pain medication, Flomax, urology follow-up.  We will continue to monitor pain.  Urinalysis negative for infection.  Pain well controlled.  Discharged in good condition.  Understands return precautions.  This chart was dictated using voice recognition software.  Despite best efforts to proofread,  errors can occur which can change the documentation meaning.   Final Clinical Impression(s) / ED Diagnoses Final diagnoses:  Kidney stone    Rx / DC Orders ED Discharge Orders         Ordered    ondansetron (ZOFRAN) 4 MG tablet  Every 6 hours        12/06/19 1327    tamsulosin (FLOMAX) 0.4 MG CAPS capsule  Daily        12/06/19 1327    HYDROcodone-acetaminophen (NORCO) 5-325 MG tablet  Every 6 hours PRN        12/06/19 1327  Virgina Norfolk, DO 12/06/19 1328

## 2019-12-06 NOTE — ED Triage Notes (Signed)
C/o left lower groin pain/ abdomen and nausea   Hx: kidney stones and diverticulitis    A/Ox4 Ambulatory in triage

## 2020-02-09 DIAGNOSIS — H903 Sensorineural hearing loss, bilateral: Secondary | ICD-10-CM | POA: Diagnosis not present

## 2020-02-16 ENCOUNTER — Other Ambulatory Visit: Payer: Self-pay | Admitting: Otolaryngology

## 2020-02-16 DIAGNOSIS — H918X9 Other specified hearing loss, unspecified ear: Secondary | ICD-10-CM

## 2020-05-21 DIAGNOSIS — H16141 Punctate keratitis, right eye: Secondary | ICD-10-CM | POA: Diagnosis not present

## 2020-06-05 DIAGNOSIS — M2550 Pain in unspecified joint: Secondary | ICD-10-CM | POA: Diagnosis not present

## 2020-07-01 DIAGNOSIS — E039 Hypothyroidism, unspecified: Secondary | ICD-10-CM | POA: Diagnosis not present

## 2020-07-01 DIAGNOSIS — R739 Hyperglycemia, unspecified: Secondary | ICD-10-CM | POA: Diagnosis not present

## 2020-07-01 DIAGNOSIS — Z0000 Encounter for general adult medical examination without abnormal findings: Secondary | ICD-10-CM | POA: Diagnosis not present

## 2020-07-01 DIAGNOSIS — E785 Hyperlipidemia, unspecified: Secondary | ICD-10-CM | POA: Diagnosis not present

## 2020-07-01 DIAGNOSIS — I10 Essential (primary) hypertension: Secondary | ICD-10-CM | POA: Diagnosis not present

## 2020-07-08 DIAGNOSIS — R7303 Prediabetes: Secondary | ICD-10-CM | POA: Diagnosis not present

## 2020-07-08 DIAGNOSIS — K589 Irritable bowel syndrome without diarrhea: Secondary | ICD-10-CM | POA: Diagnosis not present

## 2020-07-08 DIAGNOSIS — E785 Hyperlipidemia, unspecified: Secondary | ICD-10-CM | POA: Diagnosis not present

## 2020-07-08 DIAGNOSIS — Z23 Encounter for immunization: Secondary | ICD-10-CM | POA: Diagnosis not present

## 2020-07-08 DIAGNOSIS — Z0000 Encounter for general adult medical examination without abnormal findings: Secondary | ICD-10-CM | POA: Diagnosis not present

## 2020-09-11 DIAGNOSIS — L84 Corns and callosities: Secondary | ICD-10-CM | POA: Diagnosis not present

## 2020-09-11 DIAGNOSIS — B070 Plantar wart: Secondary | ICD-10-CM | POA: Diagnosis not present

## 2020-09-11 DIAGNOSIS — M2041 Other hammer toe(s) (acquired), right foot: Secondary | ICD-10-CM | POA: Diagnosis not present

## 2020-09-11 DIAGNOSIS — M216X1 Other acquired deformities of right foot: Secondary | ICD-10-CM | POA: Diagnosis not present

## 2020-09-11 DIAGNOSIS — M79671 Pain in right foot: Secondary | ICD-10-CM | POA: Diagnosis not present

## 2021-01-16 DIAGNOSIS — R739 Hyperglycemia, unspecified: Secondary | ICD-10-CM | POA: Diagnosis not present

## 2021-01-16 DIAGNOSIS — E785 Hyperlipidemia, unspecified: Secondary | ICD-10-CM | POA: Diagnosis not present

## 2021-01-16 DIAGNOSIS — E669 Obesity, unspecified: Secondary | ICD-10-CM | POA: Diagnosis not present

## 2021-01-23 ENCOUNTER — Other Ambulatory Visit: Payer: Self-pay | Admitting: Internal Medicine

## 2021-01-23 DIAGNOSIS — I10 Essential (primary) hypertension: Secondary | ICD-10-CM

## 2021-02-21 ENCOUNTER — Ambulatory Visit
Admission: RE | Admit: 2021-02-21 | Discharge: 2021-02-21 | Disposition: A | Discharge status: Home / Self Care | Payer: No Typology Code available for payment source | Source: Ambulatory Visit | Attending: Internal Medicine | Admitting: Internal Medicine

## 2021-02-21 DIAGNOSIS — I10 Essential (primary) hypertension: Secondary | ICD-10-CM

## 2021-03-06 DIAGNOSIS — Z6839 Body mass index (BMI) 39.0-39.9, adult: Secondary | ICD-10-CM | POA: Diagnosis not present

## 2021-03-06 DIAGNOSIS — E785 Hyperlipidemia, unspecified: Secondary | ICD-10-CM | POA: Diagnosis not present

## 2021-04-25 ENCOUNTER — Other Ambulatory Visit: Payer: Self-pay

## 2021-04-25 ENCOUNTER — Encounter (HOSPITAL_COMMUNITY): Payer: Self-pay

## 2021-04-25 ENCOUNTER — Emergency Department (HOSPITAL_COMMUNITY)
Admission: EM | Admit: 2021-04-25 | Discharge: 2021-04-25 | Disposition: A | Discharge status: Home / Self Care | Payer: BC Managed Care – PPO | Source: Home / Self Care | Attending: Emergency Medicine | Admitting: Emergency Medicine

## 2021-04-25 ENCOUNTER — Emergency Department (HOSPITAL_COMMUNITY): Payer: BC Managed Care – PPO

## 2021-04-25 DIAGNOSIS — R1032 Left lower quadrant pain: Secondary | ICD-10-CM | POA: Insufficient documentation

## 2021-04-25 DIAGNOSIS — K9509 Other complications of gastric band procedure: Secondary | ICD-10-CM | POA: Diagnosis not present

## 2021-04-25 DIAGNOSIS — R109 Unspecified abdominal pain: Secondary | ICD-10-CM | POA: Diagnosis not present

## 2021-04-25 DIAGNOSIS — Z79899 Other long term (current) drug therapy: Secondary | ICD-10-CM | POA: Diagnosis not present

## 2021-04-25 DIAGNOSIS — K5900 Constipation, unspecified: Secondary | ICD-10-CM | POA: Diagnosis not present

## 2021-04-25 DIAGNOSIS — R112 Nausea with vomiting, unspecified: Secondary | ICD-10-CM | POA: Insufficient documentation

## 2021-04-25 DIAGNOSIS — Z6834 Body mass index (BMI) 34.0-34.9, adult: Secondary | ICD-10-CM | POA: Diagnosis not present

## 2021-04-25 DIAGNOSIS — E785 Hyperlipidemia, unspecified: Secondary | ICD-10-CM | POA: Diagnosis not present

## 2021-04-25 DIAGNOSIS — R1084 Generalized abdominal pain: Secondary | ICD-10-CM | POA: Diagnosis not present

## 2021-04-25 DIAGNOSIS — D1800 Hemangioma unspecified site: Secondary | ICD-10-CM | POA: Diagnosis not present

## 2021-04-25 DIAGNOSIS — K760 Fatty (change of) liver, not elsewhere classified: Secondary | ICD-10-CM | POA: Diagnosis not present

## 2021-04-25 DIAGNOSIS — I10 Essential (primary) hypertension: Secondary | ICD-10-CM | POA: Diagnosis not present

## 2021-04-25 DIAGNOSIS — N3289 Other specified disorders of bladder: Secondary | ICD-10-CM | POA: Diagnosis not present

## 2021-04-25 DIAGNOSIS — R1110 Vomiting, unspecified: Secondary | ICD-10-CM | POA: Diagnosis not present

## 2021-04-25 DIAGNOSIS — E669 Obesity, unspecified: Secondary | ICD-10-CM | POA: Diagnosis not present

## 2021-04-25 DIAGNOSIS — Z20822 Contact with and (suspected) exposure to covid-19: Secondary | ICD-10-CM | POA: Diagnosis not present

## 2021-04-25 DIAGNOSIS — R9431 Abnormal electrocardiogram [ECG] [EKG]: Secondary | ICD-10-CM | POA: Diagnosis not present

## 2021-04-25 DIAGNOSIS — K6389 Other specified diseases of intestine: Secondary | ICD-10-CM | POA: Diagnosis not present

## 2021-04-25 DIAGNOSIS — Z8249 Family history of ischemic heart disease and other diseases of the circulatory system: Secondary | ICD-10-CM | POA: Diagnosis not present

## 2021-04-25 DIAGNOSIS — Z87891 Personal history of nicotine dependence: Secondary | ICD-10-CM | POA: Diagnosis not present

## 2021-04-25 DIAGNOSIS — R1115 Cyclical vomiting syndrome unrelated to migraine: Secondary | ICD-10-CM | POA: Diagnosis not present

## 2021-04-25 DIAGNOSIS — K5730 Diverticulosis of large intestine without perforation or abscess without bleeding: Secondary | ICD-10-CM | POA: Diagnosis not present

## 2021-04-25 DIAGNOSIS — Y831 Surgical operation with implant of artificial internal device as the cause of abnormal reaction of the patient, or of later complication, without mention of misadventure at the time of the procedure: Secondary | ICD-10-CM | POA: Diagnosis not present

## 2021-04-25 DIAGNOSIS — Z4651 Encounter for fitting and adjustment of gastric lap band: Secondary | ICD-10-CM | POA: Diagnosis not present

## 2021-04-25 DIAGNOSIS — E876 Hypokalemia: Secondary | ICD-10-CM | POA: Diagnosis not present

## 2021-04-25 DIAGNOSIS — Z9884 Bariatric surgery status: Secondary | ICD-10-CM | POA: Diagnosis not present

## 2021-04-25 LAB — CBC
HCT: 49.5 % (ref 39.0–52.0)
Hemoglobin: 17.8 g/dL — ABNORMAL HIGH (ref 13.0–17.0)
MCH: 32 pg (ref 26.0–34.0)
MCHC: 36 g/dL (ref 30.0–36.0)
MCV: 88.9 fL (ref 80.0–100.0)
Platelets: 220 10*3/uL (ref 150–400)
RBC: 5.57 MIL/uL (ref 4.22–5.81)
RDW: 11.6 % (ref 11.5–15.5)
WBC: 9.1 10*3/uL (ref 4.0–10.5)
nRBC: 0 % (ref 0.0–0.2)

## 2021-04-25 LAB — COMPREHENSIVE METABOLIC PANEL
ALT: 26 U/L (ref 0–44)
AST: 33 U/L (ref 15–41)
Albumin: 4.8 g/dL (ref 3.5–5.0)
Alkaline Phosphatase: 81 U/L (ref 38–126)
Anion gap: 14 (ref 5–15)
BUN: 15 mg/dL (ref 6–20)
CO2: 20 mmol/L — ABNORMAL LOW (ref 22–32)
Calcium: 9.8 mg/dL (ref 8.9–10.3)
Chloride: 100 mmol/L (ref 98–111)
Creatinine, Ser: 1.01 mg/dL (ref 0.61–1.24)
GFR, Estimated: >60 mL/min (ref >60)
Glucose, Bld: 136 mg/dL — ABNORMAL HIGH (ref 70–99)
Potassium: 4 mmol/L (ref 3.5–5.1)
Sodium: 134 mmol/L — ABNORMAL LOW (ref 135–145)
Total Bilirubin: 1.9 mg/dL — ABNORMAL HIGH (ref 0.3–1.2)
Total Protein: 8.3 g/dL — ABNORMAL HIGH (ref 6.5–8.1)

## 2021-04-25 LAB — LIPASE, BLOOD: Lipase: 28 U/L (ref 11–51)

## 2021-04-25 MED ORDER — OXYCODONE HCL 5 MG PO TABS: 5.0000 mg | ORAL_TABLET | Freq: Four times a day (QID) | ORAL | 0 refills | Status: AC | PRN

## 2021-04-25 MED ORDER — ONDANSETRON HCL 4 MG/2ML IJ SOLN
4.0000 mg | Freq: Once | INTRAMUSCULAR | Status: AC
  Administered 2021-04-25: 4 mg via INTRAVENOUS
  Filled 2021-04-25: qty 2

## 2021-04-25 MED ORDER — SODIUM CHLORIDE 0.9 % IV BOLUS
1000.0000 mL | Freq: Once | INTRAVENOUS | Status: AC
  Administered 2021-04-25: 1000 mL via INTRAVENOUS

## 2021-04-25 MED ORDER — PROCHLORPERAZINE EDISYLATE 10 MG/2ML IJ SOLN
10.0000 mg | Freq: Once | INTRAMUSCULAR | Status: AC
  Administered 2021-04-25: 10 mg via INTRAVENOUS
  Filled 2021-04-25: qty 2

## 2021-04-25 MED ORDER — ONDANSETRON HCL 4 MG PO TABS: 4.0000 mg | ORAL_TABLET | Freq: Four times a day (QID) | ORAL | 0 refills | Status: AC

## 2021-04-25 MED ORDER — HYDROMORPHONE HCL 1 MG/ML IJ SOLN
1.0000 mg | Freq: Once | INTRAMUSCULAR | Status: AC
  Administered 2021-04-25: 1 mg via INTRAVENOUS
  Filled 2021-04-25: qty 1

## 2021-04-25 MED ORDER — FENTANYL CITRATE PF 50 MCG/ML IJ SOSY
50.0000 ug | PREFILLED_SYRINGE | Freq: Once | INTRAMUSCULAR | Status: AC
  Administered 2021-04-25: 50 ug via INTRAVENOUS
  Filled 2021-04-25: qty 1

## 2021-04-25 NOTE — ED Triage Notes (Signed)
Patient c/o intermittent LLQ abdominal pain and emesis x 1 week. ?

## 2021-04-25 NOTE — Discharge Instructions (Addendum)
Take Roxicodone for breakthrough pain.  This is a narcotic pain medicine.  Do not mix with alcohol or drugs.  Do not drive or do dangerous activities while taking this medicine.  Discontinue her weight loss medication.  Take Zofran as needed for nausea and vomiting.  Follow-up with your primary care doctor. ?

## 2021-04-25 NOTE — ED Provider Notes (Signed)
?Barry Parsons ?Provider Note ? ? ?CSN: [id number] ?Arrival date & time: 04/25/21  3875 ? ?  ? ?History ? ?Chief Complaint  ?Patient presents with  ? Abdominal Pain  ? Emesis  ? ? ?Barry Parsons is a 46 y.o. male. ? ?The history is provided by the patient.  ?Abdominal Pain ?Pain location:  LLQ ?Pain quality: aching   ?Pain radiates to:  Does not radiate ?Pain severity:  Mild ?Onset quality:  Gradual ?Timing:  Constant ?Progression:  Unchanged ?Chronicity:  New ?Context comment:  Hx of kidney stone and diverticulitis ?Relieved by:  Nothing ?Worsened by:  Nothing ?Ineffective treatments:  None tried ?Associated symptoms: nausea and vomiting   ?Associated symptoms: no anorexia, no belching, no chest pain, no chills, no constipation, no cough, no diarrhea, no dysuria, no fatigue, no fever, no melena, no shortness of breath and no sore throat   ?Risk factors: has not had multiple surgeries   ?Emesis ?Associated symptoms: abdominal pain   ?Associated symptoms: no chills, no cough, no diarrhea, no fever and no sore throat   ? ?  ? ?Home Medications ?Prior to Admission medications   ?Medication Sig Start Date End Date Taking? Authorizing Provider  ?ondansetron (ZOFRAN) 4 MG tablet Take 1 tablet (4 mg total) by mouth every 6 (six) hours. 04/25/21  Yes Anupama Piehl, DO  ?oxyCODONE (ROXICODONE) 5 MG immediate release tablet Take 1 tablet (5 mg total) by mouth every 6 (six) hours as needed for up to 10 days for breakthrough pain. 04/25/21 05/05/21 Yes Nusaybah Ivie, DO  ?hyoscyamine (LEVSIN/SL) 0.125 MG SL tablet Place 0.125 mg under the tongue every 4 (four) hours as needed for cramping.  04/22/15   [provider]  ?Ibuprofen-diphenhydrAMINE Cit (ADVIL PM PO) Take 2 tablets by mouth daily as needed (pain).    [provider]  ?methocarbamol (ROBAXIN) 500 MG tablet Take 1 tablet (500 mg total) by mouth at bedtime as needed for muscle spasms. ?Patient not taking: Reported on  12/06/2019 01/20/18   [name], PA-C  ?pantoprazole (PROTONIX) 40 MG tablet Take 40 mg by mouth daily.    [provider]  ?   ? ?Allergies    ?Patient has no known allergies.   ? ?Review of Systems   ?Review of Systems  ?Constitutional:  Negative for chills, fatigue and fever.  ?HENT:  Negative for sore throat.   ?Respiratory:  Negative for cough and shortness of breath.   ?Cardiovascular:  Negative for chest pain.  ?Gastrointestinal:  Positive for abdominal pain, nausea and vomiting. Negative for anorexia, constipation, diarrhea and melena.  ?Genitourinary:  Negative for dysuria.  ? ?Physical Exam ?Updated Vital Signs ?BP (!) 185/122   Pulse 80   Temp 98 ?F (36.7 ?C) (Oral)   Resp 16   Ht 5' 11.5" (1.816 m)   Wt 114.3 kg   SpO2 99%   BMI 34.66 kg/m?  ?Physical Exam ?Vitals and nursing note reviewed.  ?Constitutional:   ?   General: He is not in acute distress. ?   Appearance: He is well-developed. He is not ill-appearing.  ?HENT:  ?   Head: Normocephalic and atraumatic.  ?Eyes:  ?   Conjunctiva/sclera: Conjunctivae normal.  ?Cardiovascular:  ?   Rate and Rhythm: Normal rate and regular rhythm.  ?   Heart sounds: Normal heart sounds. No murmur heard. ?Pulmonary:  ?   Effort: Pulmonary effort is normal. No respiratory distress.  ?   Breath sounds: Normal breath sounds.  ?  Abdominal:  ?   Palpations: Abdomen is soft.  ?   Tenderness: There is abdominal tenderness in the left lower quadrant. There is guarding.  ?Musculoskeletal:     ?   General: No swelling.  ?   Cervical back: Neck supple.  ?Skin: ?   General: Skin is warm and dry.  ?   Capillary Refill: Capillary refill takes less than 2 seconds.  ?Neurological:  ?   Mental Status: He is alert.  ?Psychiatric:     ?   Mood and Affect: Mood normal.  ? ? ?ED Results / Procedures / Treatments   ?Labs ?(all labs ordered are listed, but only abnormal results are displayed) ?Labs Reviewed  ?COMPREHENSIVE METABOLIC PANEL - Abnormal; Notable for the  following components:  ?    Result Value  ? Sodium 134 (*)   ? CO2 20 (*)   ? Glucose, Bld 136 (*)   ? Total Protein 8.3 (*)   ? Total Bilirubin 1.9 (*)   ? All other components within normal limits  ?CBC - Abnormal; Notable for the following components:  ? Hemoglobin 17.8 (*)   ? All other components within normal limits  ?LIPASE, BLOOD  ? ? ?EKG ?None ? ?Radiology ?CT Renal Stone Study ? ?Result Date: 04/25/2021 ?CLINICAL DATA:  Left lower quadrant pain and flank pain, renal stone suspected. EXAM: CT ABDOMEN AND PELVIS WITHOUT CONTRAST TECHNIQUE: Multidetector CT imaging of the abdomen and pelvis was performed following the standard protocol without IV contrast. RADIATION DOSE REDUCTION: This exam was performed according to the departmental dose-optimization program which includes automated exposure control, adjustment of the mA and/or kV according to patient size and/or use of iterative reconstruction technique. COMPARISON:  CT December 06, 2019 FINDINGS: Lower chest: No acute abnormality. Hepatobiliary: Hepatic steatosis. No suspicious hepatic lesion. Gallbladder surgically absent. No biliary ductal dilation. Pancreas: No pancreatic ductal dilation or evidence of acute inflammation. Spleen: No splenomegaly or focal splenic lesion. Adrenals/Urinary Tract: Bilateral adrenal glands appear normal. No hydronephrosis. No renal, ureteral or bladder calculi identified. Symmetric wall thickening of an incompletely distended urinary bladder. Stomach/Bowel: No enteric contrast was administered. Lap band is present along the gastric cardia with accessed port in the right anterior abdominal wall. No pathologic dilation of small or large bowel. The appendix and terminal ileum appear normal. Colonic diverticulosis without findings of acute diverticulitis. Sigmoid colonic anastomotic sutures. Vascular/Lymphatic: Normal caliber abdominal aorta. No pathologically enlarged abdominal or pelvic lymph nodes. Reproductive: Prostate is  unremarkable. Other: No significant abdominopelvic free fluid. Musculoskeletal: L3 vertebral body hemangioma. Multilevel degenerative changes spine. IMPRESSION: 1. No acute abnormality of the abdomen or pelvis. Specifically, no evidence of obstructive uropathy. 2. Colonic diverticulosis without findings of acute diverticulitis. 3. Hepatic steatosis. 4. Lap band present along the gastric cardia with accessed port in the right anterior abdominal wall. Electronically Signed   By: Maudry MayhewJeffrey  Waltz M.D.   On: 04/25/2021 10:03   ? ?Procedures ?Procedures  ? ? ?Medications Ordered in ED ?Medications  ?sodium chloride 0.9 % bolus 1,000 mL (1,000 mLs Intravenous New Bag/Given (Non-Interop) 04/25/21 [id number])  ?ondansetron [facility](ZOFRAN) injection 4 mg (4 mg Intravenous Given 04/25/21 0927)  ?fentaNYL (SUBLIMAZE) injection 50 mcg (50 mcg Intravenous Given 04/25/21 0927)  ?HYDROmorphone (DILAUDID) injection 1 mg (1 mg Intravenous Given 04/25/21 1018)  ?prochlorperazine (COMPAZINE) injection 10 mg (10 mg Intravenous Given 04/25/21 1051)  ? ? ?ED Course/ Medical Decision Making/ A&P ?  ?                        ?  Medical Decision Making ?Amount and/or Complexity of Data Reviewed ?Labs: ordered. ?Radiology: ordered. ? ?Risk ?Prescription drug management. ? ? ?Barry Parsons is here with left lower abdominal pain.  Normal vitals.  No fever.  Differential diagnosis is diverticulitis versus kidney stone.  Less likely bowel obstruction or UTI.  We will check CBC, CMP, lipase, urinalysis, CT scan abdomen and pelvis.  Will give IV fluids, IV Zofran, IV fentanyl and reevaluate. ? ?I have reviewed and interpreted labs, no significant anemia, electrolyte abnormality, kidney injury.  No significant leukocytosis.  CT scan showed no acute findings per radiology report.  No diverticulitis.  No kidney stone.  No pancreatitis.  Gallbladder has already been removed in the past.  Overall suspect acute on chronic pain.  Will write Roxicodone for breakthrough pain.   We will write Zofran for nausea and vomiting.  Did have a prescription for tramadol fairly monthly but does not have an active narcotic prescription.  This could be withdrawal symptoms as well.  He did state he r

## 2021-04-27 ENCOUNTER — Inpatient Hospital Stay (HOSPITAL_COMMUNITY)
Admission: EM | Admit: 2021-04-27 | Discharge: 2021-04-30 | DRG: 328 | Disposition: A | Discharge status: Home / Self Care | Payer: BC Managed Care – PPO | Attending: Internal Medicine | Admitting: Internal Medicine

## 2021-04-27 ENCOUNTER — Encounter (HOSPITAL_COMMUNITY): Payer: Self-pay

## 2021-04-27 ENCOUNTER — Emergency Department (HOSPITAL_COMMUNITY): Payer: BC Managed Care – PPO

## 2021-04-27 ENCOUNTER — Other Ambulatory Visit: Payer: Self-pay

## 2021-04-27 DIAGNOSIS — Y831 Surgical operation with implant of artificial internal device as the cause of abnormal reaction of the patient, or of later complication, without mention of misadventure at the time of the procedure: Secondary | ICD-10-CM | POA: Diagnosis present

## 2021-04-27 DIAGNOSIS — Z79899 Other long term (current) drug therapy: Secondary | ICD-10-CM | POA: Diagnosis not present

## 2021-04-27 DIAGNOSIS — E669 Obesity, unspecified: Secondary | ICD-10-CM | POA: Diagnosis present

## 2021-04-27 DIAGNOSIS — R1115 Cyclical vomiting syndrome unrelated to migraine: Secondary | ICD-10-CM | POA: Diagnosis present

## 2021-04-27 DIAGNOSIS — E876 Hypokalemia: Secondary | ICD-10-CM | POA: Diagnosis present

## 2021-04-27 DIAGNOSIS — R112 Nausea with vomiting, unspecified: Secondary | ICD-10-CM | POA: Diagnosis present

## 2021-04-27 DIAGNOSIS — Z20822 Contact with and (suspected) exposure to covid-19: Secondary | ICD-10-CM | POA: Diagnosis present

## 2021-04-27 DIAGNOSIS — K5900 Constipation, unspecified: Secondary | ICD-10-CM | POA: Diagnosis present

## 2021-04-27 DIAGNOSIS — Z8249 Family history of ischemic heart disease and other diseases of the circulatory system: Secondary | ICD-10-CM

## 2021-04-27 DIAGNOSIS — E785 Hyperlipidemia, unspecified: Secondary | ICD-10-CM | POA: Diagnosis present

## 2021-04-27 DIAGNOSIS — I10 Essential (primary) hypertension: Secondary | ICD-10-CM | POA: Diagnosis present

## 2021-04-27 DIAGNOSIS — Z9884 Bariatric surgery status: Secondary | ICD-10-CM

## 2021-04-27 DIAGNOSIS — Z87891 Personal history of nicotine dependence: Secondary | ICD-10-CM

## 2021-04-27 DIAGNOSIS — R1032 Left lower quadrant pain: Principal | ICD-10-CM

## 2021-04-27 DIAGNOSIS — K9509 Other complications of gastric band procedure: Principal | ICD-10-CM | POA: Diagnosis present

## 2021-04-27 DIAGNOSIS — Z6834 Body mass index (BMI) 34.0-34.9, adult: Secondary | ICD-10-CM | POA: Diagnosis not present

## 2021-04-27 DIAGNOSIS — K760 Fatty (change of) liver, not elsewhere classified: Secondary | ICD-10-CM | POA: Diagnosis present

## 2021-04-27 LAB — URINALYSIS, ROUTINE W REFLEX MICROSCOPIC
Bacteria, UA: NONE SEEN
Bilirubin Urine: NEGATIVE
Glucose, UA: NEGATIVE mg/dL
Ketones, ur: 80 mg/dL — AB
Leukocytes,Ua: NEGATIVE
Nitrite: NEGATIVE
Protein, ur: NEGATIVE mg/dL
Specific Gravity, Urine: 1.034 — ABNORMAL HIGH (ref 1.005–1.030)
pH: 7 (ref 5.0–8.0)

## 2021-04-27 LAB — RESP PANEL BY RT-PCR (FLU A&B, COVID) ARPGX2
Influenza A by PCR: NEGATIVE
Influenza B by PCR: NEGATIVE
SARS Coronavirus 2 by RT PCR: NEGATIVE

## 2021-04-27 LAB — COMPREHENSIVE METABOLIC PANEL
ALT: 21 U/L (ref 0–44)
AST: 21 U/L (ref 15–41)
Albumin: 4.6 g/dL (ref 3.5–5.0)
Alkaline Phosphatase: 71 U/L (ref 38–126)
Anion gap: 11 (ref 5–15)
BUN: 13 mg/dL (ref 6–20)
CO2: 21 mmol/L — ABNORMAL LOW (ref 22–32)
Calcium: 9.2 mg/dL (ref 8.9–10.3)
Chloride: 103 mmol/L (ref 98–111)
Creatinine, Ser: 0.87 mg/dL (ref 0.61–1.24)
GFR, Estimated: >60 mL/min (ref >60)
Glucose, Bld: 138 mg/dL — ABNORMAL HIGH (ref 70–99)
Potassium: 3.9 mmol/L (ref 3.5–5.1)
Sodium: 135 mmol/L (ref 135–145)
Total Bilirubin: 0.6 mg/dL (ref 0.3–1.2)
Total Protein: 7.7 g/dL (ref 6.5–8.1)

## 2021-04-27 LAB — CBC
HCT: 46.5 % (ref 39.0–52.0)
Hemoglobin: 16.8 g/dL (ref 13.0–17.0)
MCH: 32.2 pg (ref 26.0–34.0)
MCHC: 36.1 g/dL — ABNORMAL HIGH (ref 30.0–36.0)
MCV: 89.1 fL (ref 80.0–100.0)
Platelets: 246 10*3/uL (ref 150–400)
RBC: 5.22 MIL/uL (ref 4.22–5.81)
RDW: 11.6 % (ref 11.5–15.5)
WBC: 9 10*3/uL (ref 4.0–10.5)
nRBC: 0 % (ref 0.0–0.2)

## 2021-04-27 LAB — LIPASE, BLOOD: Lipase: 28 U/L (ref 11–51)

## 2021-04-27 MED ORDER — HYDRALAZINE HCL 20 MG/ML IJ SOLN
10.0000 mg | Freq: Four times a day (QID) | INTRAMUSCULAR | Status: DC | PRN
  Administered 2021-04-27: 10 mg via INTRAVENOUS
  Filled 2021-04-27: qty 1

## 2021-04-27 MED ORDER — SODIUM CHLORIDE 0.9 % IV BOLUS
1000.0000 mL | Freq: Once | INTRAVENOUS | Status: AC
  Administered 2021-04-27: 1000 mL via INTRAVENOUS

## 2021-04-27 MED ORDER — ONDANSETRON HCL 4 MG/2ML IJ SOLN
4.0000 mg | INTRAMUSCULAR | Status: DC | PRN
  Administered 2021-04-27 – 2021-04-29 (×6): 4 mg via INTRAVENOUS
  Filled 2021-04-27 (×6): qty 2

## 2021-04-27 MED ORDER — HYOSCYAMINE SULFATE 0.125 MG SL SUBL
0.1250 mg | SUBLINGUAL_TABLET | SUBLINGUAL | Status: DC | PRN
  Administered 2021-04-27 – 2021-04-28 (×2): 0.125 mg via SUBLINGUAL
  Filled 2021-04-27 (×5): qty 1

## 2021-04-27 MED ORDER — ACETAMINOPHEN 325 MG PO TABS
650.0000 mg | ORAL_TABLET | Freq: Four times a day (QID) | ORAL | Status: DC | PRN
  Administered 2021-04-27: 650 mg via ORAL
  Filled 2021-04-27: qty 2

## 2021-04-27 MED ORDER — ENOXAPARIN SODIUM 40 MG/0.4ML IJ SOSY
40.0000 mg | PREFILLED_SYRINGE | INTRAMUSCULAR | Status: DC
  Administered 2021-04-27: 40 mg via SUBCUTANEOUS
  Filled 2021-04-27: qty 0.4

## 2021-04-27 MED ORDER — PANTOPRAZOLE SODIUM 40 MG IV SOLR
40.0000 mg | Freq: Every day | INTRAVENOUS | Status: DC
  Administered 2021-04-27 – 2021-04-30 (×4): 40 mg via INTRAVENOUS
  Filled 2021-04-27 (×4): qty 10

## 2021-04-27 MED ORDER — METOCLOPRAMIDE HCL 5 MG/ML IJ SOLN
5.0000 mg | Freq: Three times a day (TID) | INTRAMUSCULAR | Status: DC
  Administered 2021-04-27 – 2021-04-30 (×9): 5 mg via INTRAVENOUS
  Filled 2021-04-27 (×9): qty 2

## 2021-04-27 MED ORDER — FENTANYL CITRATE PF 50 MCG/ML IJ SOSY
50.0000 ug | PREFILLED_SYRINGE | Freq: Once | INTRAMUSCULAR | Status: AC
  Administered 2021-04-27: 50 ug via INTRAVENOUS
  Filled 2021-04-27: qty 1

## 2021-04-27 MED ORDER — ACETAMINOPHEN 650 MG RE SUPP: 650.0000 mg | Freq: Four times a day (QID) | RECTAL | Status: DC | PRN

## 2021-04-27 MED ORDER — METOCLOPRAMIDE HCL 5 MG/ML IJ SOLN
10.0000 mg | Freq: Once | INTRAMUSCULAR | Status: AC
Start: 2021-04-27 — End: 2021-04-27
  Administered 2021-04-27: 10 mg via INTRAVENOUS
  Filled 2021-04-27: qty 2

## 2021-04-27 MED ORDER — MORPHINE SULFATE (PF) 2 MG/ML IV SOLN
1.0000 mg | INTRAVENOUS | Status: DC | PRN
  Administered 2021-04-27 – 2021-04-30 (×12): 1 mg via INTRAVENOUS
  Filled 2021-04-27 (×13): qty 1

## 2021-04-27 MED ORDER — IOHEXOL 300 MG/ML  SOLN
100.0000 mL | Freq: Once | INTRAMUSCULAR | Status: AC | PRN
  Administered 2021-04-27: 100 mL via INTRAVENOUS

## 2021-04-27 MED ORDER — ONDANSETRON HCL 4 MG/2ML IJ SOLN
4.0000 mg | Freq: Once | INTRAMUSCULAR | Status: AC
  Administered 2021-04-27: 4 mg via INTRAVENOUS
  Filled 2021-04-27: qty 2

## 2021-04-27 MED ORDER — DEXTROSE-NACL 5-0.9 % IV SOLN: INTRAVENOUS | Status: DC

## 2021-04-27 MED ORDER — DICYCLOMINE HCL 10 MG/ML IM SOLN
20.0000 mg | Freq: Once | INTRAMUSCULAR | Status: AC
  Administered 2021-04-27: 20 mg via INTRAMUSCULAR
  Filled 2021-04-27: qty 2

## 2021-04-27 NOTE — ED Notes (Signed)
Patient has urinal and aware urine sample is needed. ?

## 2021-04-27 NOTE — Assessment & Plan Note (Signed)
His blood pressure is elevated, likely due to nausea and vomiting. ?Plan to continue close monitoring and add IV hydralazine as needed in case systolic blood pressure more than 190 mmHg.  ?At home not clear if patient on antihypertensive medications.  ?

## 2021-04-27 NOTE — Assessment & Plan Note (Signed)
Patient not on statin therapy at home.  ?

## 2021-04-27 NOTE — Assessment & Plan Note (Signed)
Calculated BMI is 34.6  ?

## 2021-04-27 NOTE — ED Provider Notes (Signed)
?[facility]-EMERGENCY DEPT ?Provider Note ? ? ?CSN: [id number] ?Arrival date & time: 04/27/21  1215 ? ?  ? ?History ? ?Chief Complaint  ?Patient presents with  ? Abdominal Pain  ? Emesis  ? ? ?Barry Parsons is a 46 y.o. male. Patient presents with a complaint of LLQ pain, nausea, and vomiting. The patient was seen on 3/31 for the same complaint and was prescribed pain medication and Zofran. The patient states that he was able to tolerate mashed potatoes yesterday. Today at [facility] the patient tried to eat Jello and began vomiting. He has been unable to tolerate PO intake since that time and his LLQ pain continues. Patient denies chest pain, constipation, diarrhea, dysuria, fever, melena, shortness of breath.  PMH significant for lap-band surgery, obesity, diverticulitis, kidney stones.  ? ?Upon reexamination patient states that he recently began taking [facility] for weightloss. Last dose 3/24.  Patient has hx of sigmoid resection in 2016.  ? ?HPI ? ?  ? ?Home Medications ?Prior to Admission medications   ?Medication Sig Start Date End Date Taking? Authorizing Provider  ?hyoscyamine (LEVSIN SL) 0.125 MG SL tablet Place 0.125 mg under the tongue every 4 (four) hours as needed for cramping.  04/22/15  Yes [provider]  ?Ibuprofen-diphenhydrAMINE Cit (ADVIL PM PO) Take 2 tablets by mouth daily as needed (pain).   Yes [provider]  ?ondansetron (ZOFRAN) 4 MG tablet Take 1 tablet (4 mg total) by mouth every 6 (six) hours. ?Patient taking differently: Take 4 mg by mouth every 6 (six) hours as needed for nausea or vomiting. 04/25/21  Yes Curatolo, Adam, DO  ?oxyCODONE (ROXICODONE) 5 MG immediate release tablet Take 1 tablet (5 mg total) by mouth every 6 (six) hours as needed for up to 10 days for breakthrough pain. 04/25/21 05/05/21 Yes Curatolo, Adam, DO  ?pantoprazole (PROTONIX) 40 MG tablet Take 40 mg by mouth daily.   Yes [provider]  ?traMADol (ULTRAM) 50 MG tablet Take 50  mg by mouth every 6 (six) hours as needed for moderate pain or severe pain.   Yes [provider]  ?methocarbamol (ROBAXIN) 500 MG tablet Take 1 tablet (500 mg total) by mouth at bedtime as needed for muscle spasms. ?Patient not taking: Reported on 12/06/2019 01/20/18   [name], PA-C  ?   ? ?Allergies    ?Patient has no known allergies.   ? ?Review of Systems   ?Review of Systems  ?Constitutional:  Negative for appetite change and fever.  ?Respiratory:  Negative for shortness of breath.   ?Cardiovascular:  Negative for chest pain.  ?Gastrointestinal:  Positive for abdominal pain, nausea and vomiting. Negative for constipation and diarrhea.  ?Genitourinary:  Negative for dysuria and flank pain.  ? ?Physical Exam ?Updated Vital Signs ?BP (!) 152/98   Pulse (!) 107   Temp 98.6 ?F (37 ?C) (Oral)   Resp 18   Ht 5' 11.5" (1.816 m)   Wt 114.3 kg   SpO2 100%   BMI 34.66 kg/m?  ?Physical Exam ?Vitals and nursing note reviewed.  ?Constitutional:   ?   Appearance: He is obese.  ?HENT:  ?   Head: Normocephalic.  ?Eyes:  ?   Extraocular Movements: Extraocular movements intact.  ?Cardiovascular:  ?   Rate and Rhythm: Normal rate and regular rhythm.  ?   Heart sounds: Normal heart sounds.  ?Pulmonary:  ?   Effort: Pulmonary effort is normal. No respiratory distress.  ?   Breath sounds:  Normal breath sounds.  ?Abdominal:  ?   General: Abdomen is flat. There is no distension.  ?   Palpations: Abdomen is soft.  ?   Tenderness: There is abdominal tenderness (Generalized tenderness, more severe LLQ).  ?Skin: ?   General: Skin is warm and dry.  ?Neurological:  ?   Mental Status: He is alert.  ? ? ?ED Results / Procedures / Treatments   ?Labs ?(all labs ordered are listed, but only abnormal results are displayed) ?Labs Reviewed  ?COMPREHENSIVE METABOLIC PANEL - Abnormal; Notable for the following components:  ?    Result Value  ? CO2 21 (*)   ? Glucose, Bld 138 (*)   ? All other components within normal limits   ?CBC - Abnormal; Notable for the following components:  ? MCHC 36.1 (*)   ? All other components within normal limits  ?URINALYSIS, ROUTINE W REFLEX MICROSCOPIC - Abnormal; Notable for the following components:  ? Specific Gravity, Urine 1.034 (*)   ? Hgb urine dipstick SMALL (*)   ? Ketones, ur 80 (*)   ? All other components within normal limits  ?RESP PANEL BY RT-PCR (FLU A&B, COVID) ARPGX2  ?LIPASE, BLOOD  ? ? ?EKG ?None ? ?Radiology ?CT Abdomen Pelvis W Contrast ? ?Result Date: 04/27/2021 ?CLINICAL DATA:  46 year old male with acute LEFT abdominal and pelvic pain with vomiting for 1 and half weeks. EXAM: CT ABDOMEN AND PELVIS WITH CONTRAST TECHNIQUE: Multidetector CT imaging of the abdomen and pelvis was performed using the standard protocol following bolus administration of intravenous contrast. RADIATION DOSE REDUCTION: This exam was performed according to the departmental dose-optimization program which includes automated exposure control, adjustment of the mA and/or kV according to patient size and/or use of iterative reconstruction technique. CONTRAST:  100mL OMNIPAQUE IOHEXOL 300 MG/ML  SOLN COMPARISON:  04/25/2021 and prior CTs FINDINGS: Lower chest: No acute abnormality. Hepatobiliary: Probable mild hepatic steatosis again noted without focal hepatic abnormality. The patient is status post cholecystectomy. No biliary dilatation. Pancreas: Unremarkable Spleen: Unremarkable Adrenals/Urinary Tract: No significant renal, adrenal or bladder abnormalities are noted. Stomach/Bowel: Lap band apparatus identified without gross complicating features. The stomach is otherwise unremarkable. Distal colonic surgical changes are identified. There is no evidence of bowel obstruction, definite bowel wall thickening or inflammatory changes. The appendix is normal. Colonic diverticulosis noted without evidence of acute diverticulitis. Vascular/Lymphatic: No significant vascular findings are present. No enlarged abdominal  or pelvic lymph nodes. Reproductive: Prostate is unremarkable. Other: No ascites, focal collection or pneumoperitoneum. Musculoskeletal: No acute or suspicious bony abnormalities are noted. IMPRESSION: 1. No evidence of acute abnormality. 2. Probable mild hepatic steatosis. 3. Lap band apparatus without gross complicating features. Electronically Signed   By: Harmon PierJeffrey  Hu M.D.   On: 04/27/2021 14:02   ? ?Procedures ?Procedures  ? ? ?Medications Ordered in ED ?Medications  ?sodium chloride 0.9 % bolus 1,000 mL (0 mLs Intravenous Stopped 04/27/21 1428)  ?ondansetron (ZOFRAN) injection 4 mg (4 mg Intravenous Given 04/27/21 1246)  ?dicyclomine (BENTYL) injection 20 mg (20 mg Intramuscular Given 04/27/21 1247)  ?iohexol (OMNIPAQUE) 300 MG/ML solution 100 mL (100 mLs Intravenous Contrast Given 04/27/21 1341)  ?metoCLOPramide (REGLAN) injection 10 mg (10 mg Intravenous Given 04/27/21 1423)  ?fentaNYL (SUBLIMAZE) injection 50 mcg (50 mcg Intravenous Given 04/27/21 1441)  ? ? ?ED Course/ Medical Decision Making/ A&P ?  ?                        ?Medical Decision  Making ?Amount and/or Complexity of Data Reviewed ?Labs: ordered. ?Radiology: ordered. ? ?Risk ?Prescription drug management. ? ? ?This patient presents to the ED for concern of abdominal pain with nausea and vomiting, this involves an extensive number of treatment options, and is a complaint that carries with it a high risk of complications and morbidity.  The differential diagnosis includes but is not limited to diverticulitis, gastroenteritis, appendicitis, and others  ? ? ?Co morbidities that complicate the patient evaluation ? ?Hx of Lap-Band surgery ? ? ?Additional history obtained: ? ? ?External records from outside source obtained and reviewed including ED notes from 04/25/21 ? ? ?Lab Tests: ? ?I Ordered, and personally interpreted labs.  The pertinent results include:  CO2 21, glucose 138, MCHC 36.1 ? ? ?Imaging Studies ordered: ? ?I ordered imaging studies including CT  abdomen/pelvis with contrast  ?I independently visualized and interpreted imaging which showed 1. No evidence of acute abnormality. ?2. Probable mild hepatic steatosis. ?3. Lap band apparatus without gross compli

## 2021-04-27 NOTE — ED Notes (Signed)
Patient difficulty tolerating NG tube placement. Requested removal during attempt. PA aware. ?

## 2021-04-27 NOTE — Plan of Care (Signed)
Pt transferred to bed from ED  ?

## 2021-04-27 NOTE — Assessment & Plan Note (Addendum)
Patient has failed outpatient therapy.  ?Nausea and vomiting possible multifactorial, possible related to GLP 1 agonist. ? ?Plan to continue supportive medical care with IV fluids and IV antiacids, as needed analgesics and antiemetics.  ?Surgery was called per ED and recommended contrast CT if persistent symptoms. Patient has not tolerated NG tube in the ED.  ?For now will continue with clear liquid diet.  ?Scheduled TID IV metoclopramide.  ?

## 2021-04-27 NOTE — H&P (Addendum)
?History and Physical  ? ? ?Patient: Barry Parsons GHW:[id number] DOB: [birth year] ?DOA: 04/27/2021 ?DOS: the patient was seen and examined on 04/27/2021 ?PCP: Merri Brunette, MD  ?Patient coming from: Home ? ?Chief Complaint: nausea and vomiting.  ?Chief Complaint  ?Patient presents with  ? Abdominal Pain  ? Emesis  ? ?HPI: Barry Parsons is a 46 y.o. male with medical history significant of obesity sp weight reduction surgery many years ago, hypertension and dyslipidemia who presented with persistent nausea and vomiting. He presented to the ED on 03/31 with abdominal pain, nausea and vomiting, he was treated with antiemetic and analgesics therapy and discharged home. CT at that time of the abdomen and pelvis not acute changes.  ? ?His symptoms started 3 weeks ago, with intermittent nausea, vomiting and abdominal pain. He has been using GP1 agonist (semaglutide) injection for weight loss every week and recently increased the dose from 0.5 mg that he took for 3 weeks to 1 mg that he took for 1 week before his symptoms worsened.  ?Last time he had the injection was on 04/18/21.  ? ?At home patient had worsening symptoms, of nausea and vomiting, worse with moving and with no improving factors, associated with right lower quadrant abdominal pain, no fevers or chills.  ?Last meal was about 7 days ago.  ? 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  ? ? ?Review of Systems: As mentioned in the history of present illness. All other systems reviewed and are negative. ?Past Medical History:  ?Diagnosis Date  ? Arthritis   ? Diverticulitis   ? Kidney stones   ? ?Past Surgical History:  ?Procedure Laterality Date  ? CHOLECYSTECTOMY    ? LAPAROSCOPIC GASTRIC BANDING  11/18/2010  ? LAPAROSCOPIC SIGMOID COLECTOMY N/A 11/05/2014  ? Procedure: LAPAROSCOPIC SIGMOID COLECTOMY;  Surgeon: [name], MD;  Location: [facility] ORS;   Service: General;  Laterality: N/A;  ? TONSILLECTOMY    ? VASECTOMY  01/27/2007  ? ?Social History:  reports that he quit smoking about 8 years ago. His smoking use included cigarettes. He smoked an average of .25 packs per day. He has never used smokeless tobacco. He reports that he does not currently use alcohol. He reports that he does not use drugs. ? ?No Known Allergies ? ?Family History  ?Problem Relation Age of Onset  ? Heart disease Mother   ? Kidney Stones Other   ? Gallstones Other   ? ? ?Prior to Admission medications   ?Medication Sig Start Date End Date Taking? Authorizing Provider  ?hyoscyamine (LEVSIN SL) 0.125 MG SL tablet Place 0.125 mg under the tongue every 4 (four) hours as needed for cramping.  04/22/15  Yes [provider]  ?Ibuprofen-diphenhydrAMINE Cit (ADVIL PM PO) Take 2 tablets by mouth daily as needed (pain).   Yes [provider]  ?ondansetron (ZOFRAN) 4 MG tablet Take 1 tablet (4 mg total) by mouth every 6 (six) hours. ?Patient taking differently: Take 4 mg by mouth every 6 (six) hours as needed for nausea or vomiting. 04/25/21  Yes Curatolo, Adam, DO  ?oxyCODONE (ROXICODONE) 5 MG immediate release tablet Take 1 tablet (5 mg total) by mouth every 6 (six) hours as needed for up to 10 days for breakthrough pain. 04/25/21 05/05/21 Yes Curatolo, Adam, DO  ?pantoprazole (PROTONIX) 40 MG tablet Take 40 mg by mouth daily.   Yes [provider]  ?traMADol Janean Sark)  50 MG tablet Take 50 mg by mouth every 6 (six) hours as needed for moderate pain or severe pain.   Yes [provider]  ?methocarbamol (ROBAXIN) 500 MG tablet Take 1 tablet (500 mg total) by mouth at bedtime as needed for muscle spasms. ?Patient not taking: Reported on 12/06/2019 01/20/18   Jacinto HalimMaczis, Michael M, PA-C  ? ? ?Physical Exam: ?Vitals:  ? 04/27/21 1300 04/27/21 1416 04/27/21 1442 04/27/21 1515  ?BP: (!) 188/104 (!) 157/105 (!) 171/97 (!) 152/98  ?Pulse: 80 (!) 115 91 (!) 107  ?Resp: (!) 22 (!)  25 16 18   ?Temp:      ?TempSrc:      ?SpO2: 99% 100% 100% 100%  ?Weight:      ?Height:      ? ?Neurology patient awake and alert ?Deconditioned and ill looking appearing ?ENT with mild pallor but not icterus ?Cardiovascular with S1 and S2 present and rhythmic with no gallops, rubs or murmurs ?Respiratory with no rales or wheezing ?Abdomen protuberant tender to deep palpation, no masses palpable, no rebound or guarding ?No lower extremity edema ?Skin with no rashes.  ?Data Reviewed: ? ? ?46 yo male who presented with 3 weeks of worsening nausea and vomiting, associated with abdominal pain and poor oral intake. He has been on GLP1 agonists for weight reduction with recent dose increment. On his initial physical examination he is ill looking appearing, his abdomen is tender to deep palpation, but no peritoneal signs. He did not tolerate NG tube placement in the ED.  ?He has failed outpatient management with a recent ED evaluation.  ? ?Na 135, K 3,9 cl 103, bicarbonate 21, glucose 138, bun 13 cr 0,87  ?Wbc 9,0 hgb 16,8 hct 46.1 plt 246  ?Urine analysis with SG 1,034 with 0-5 wbc and 0-5 rbc ? ?CT abdomen and pelvis with no acute changes. Mild hepatic steatosis. Lap band apparatus without gross complicating features.  ? ?EKG 76 bpm, normal axis, normal intervals, sinus rhythm, with no significant ST segment or T wave changes.  ? ?Assessment and Plan: ?* Intractable nausea and vomiting ?Patient has failed outpatient therapy.  ?Nausea and vomiting possible multifactorial, possible related to GLP 1 agonist. ? ?Plan to continue supportive medical care with IV fluids and IV antiacids, as needed analgesics and antiemetics.  ?Surgery was called per ED and recommended contrast CT if persistent symptoms. Patient has not tolerated NG tube in the ED.  ?For now will continue with clear liquid diet.  ?Scheduled TID IV metoclopramide.  ? ?Class 1 obesity ?Calculated BMI is 34.6  ? ?Dyslipidemia ?Patient not on statin therapy at home.   ? ?Essential hypertension ?His blood pressure is elevated, likely due to nausea and vomiting. ?Plan to continue close monitoring and add IV hydralazine as needed in case systolic blood pressure more than 190 mmHg.  ?At home not clear if patient on antihypertensive medications.  ? ? ?I spoke over the phone with the patient's wife about patient's  condition, plan of care, prognosis and all questions were addressed.  ? ? Advance Care Planning:   Code Status: Prior  ? ?Consults: none  ? ?Family Communication: no family at the bedside  ? ?Severity of Illness: ?The appropriate patient status for this patient is INPATIENT. Inpatient status is judged to be reasonable and necessary in order to provide the required intensity of service to ensure the patient's safety. The patient's presenting symptoms, physical exam findings, and initial radiographic and laboratory data in the context of their  chronic comorbidities is felt to place them at high risk for further clinical deterioration. Furthermore, it is not anticipated that the patient will be medically stable for discharge from the hospital within 2 midnights of admission.  ? ?* I certify that at the point of admission it is my clinical judgment that the patient will require inpatient hospital care spanning beyond 2 midnights from the point of admission due to high intensity of service, high risk for further deterioration and high frequency of surveillance required.* ? ?Author: ?Coralie Keens, MD ?04/27/2021 3:55 PM ? ?For on call review [email or web address].  ?

## 2021-04-27 NOTE — ED Triage Notes (Signed)
Patient c/o LLQ abdominal pain, N/V x 1 1/2 weeks. Patient was seen on 04/25/21 for the same. Patient states unable to keep meds down that were prescribed. ?

## 2021-04-28 DIAGNOSIS — R112 Nausea with vomiting, unspecified: Secondary | ICD-10-CM | POA: Diagnosis not present

## 2021-04-28 LAB — CBC
HCT: 43.2 % (ref 39.0–52.0)
Hemoglobin: 15.6 g/dL (ref 13.0–17.0)
MCH: 32.3 pg (ref 26.0–34.0)
MCHC: 36.1 g/dL — ABNORMAL HIGH (ref 30.0–36.0)
MCV: 89.4 fL (ref 80.0–100.0)
Platelets: 247 10*3/uL (ref 150–400)
RBC: 4.83 MIL/uL (ref 4.22–5.81)
RDW: 11.7 % (ref 11.5–15.5)
WBC: 11.4 10*3/uL — ABNORMAL HIGH (ref 4.0–10.5)
nRBC: 0 % (ref 0.0–0.2)

## 2021-04-28 LAB — BASIC METABOLIC PANEL
Anion gap: 8 (ref 5–15)
BUN: 7 mg/dL (ref 6–20)
CO2: 21 mmol/L — ABNORMAL LOW (ref 22–32)
Calcium: 8.8 mg/dL — ABNORMAL LOW (ref 8.9–10.3)
Chloride: 106 mmol/L (ref 98–111)
Creatinine, Ser: 0.76 mg/dL (ref 0.61–1.24)
GFR, Estimated: >60 mL/min (ref >60)
Glucose, Bld: 118 mg/dL — ABNORMAL HIGH (ref 70–99)
Potassium: 3.3 mmol/L — ABNORMAL LOW (ref 3.5–5.1)
Sodium: 135 mmol/L (ref 135–145)

## 2021-04-28 LAB — HIV ANTIBODY (ROUTINE TESTING W REFLEX): HIV Screen 4th Generation wRfx: NONREACTIVE

## 2021-04-28 LAB — PHOSPHORUS: Phosphorus: 3.3 mg/dL (ref 2.5–4.6)

## 2021-04-28 LAB — MAGNESIUM: Magnesium: 2 mg/dL (ref 1.7–2.4)

## 2021-04-28 MED ORDER — POTASSIUM CHLORIDE CRYS ER 20 MEQ PO TBCR
40.0000 meq | EXTENDED_RELEASE_TABLET | Freq: Once | ORAL | Status: AC
  Administered 2021-04-28: 40 meq via ORAL
  Filled 2021-04-28: qty 2

## 2021-04-28 MED ORDER — ENOXAPARIN SODIUM 60 MG/0.6ML IJ SOSY
60.0000 mg | PREFILLED_SYRINGE | INTRAMUSCULAR | Status: DC
  Administered 2021-04-28 – 2021-04-29 (×2): 60 mg via SUBCUTANEOUS
  Filled 2021-04-28 (×2): qty 0.6

## 2021-04-28 MED ORDER — BOOST / RESOURCE BREEZE PO LIQD CUSTOM
1.0000 | Freq: Three times a day (TID) | ORAL | Status: DC
  Administered 2021-04-28 (×2): 1 via ORAL

## 2021-04-28 MED ORDER — SODIUM CHLORIDE 0.9 % IV SOLN
2.0000 g | Freq: Every day | INTRAVENOUS | Status: DC
  Administered 2021-04-28 – 2021-04-30 (×3): 2 g via INTRAVENOUS
  Filled 2021-04-28 (×3): qty 20

## 2021-04-28 MED ORDER — METRONIDAZOLE 500 MG/100ML IV SOLN
500.0000 mg | Freq: Two times a day (BID) | INTRAVENOUS | Status: DC
  Administered 2021-04-28 – 2021-04-30 (×5): 500 mg via INTRAVENOUS
  Filled 2021-04-28 (×5): qty 100

## 2021-04-28 NOTE — Consult Note (Addendum)
[facility] ?Consult Note ? ?Barry Parsons ?1975-09-18  ?[id number].   ? ?Requesting MD: Lynwood Dawley ?Chief Complaint/Reason for Consult: abdominal ? ?HPI:  ?Barry Parsons is a 46yo male PMH obesity taking Wagovy for weight loss, who was admitted to [facility] yesterday with LLQ abdominal pain, nausea, and vomiting. States that his symptoms started about 3 weeks ago after increasing Wagovy dose. He cannot tolerate anything PO. Typically he will vomit about 10 minutes after trying to eat/drink anything. He does have a h/o laparoscopic gastric banding in 2012, but he states that this feels different than any prior issues with the lap band. He thinks he still has some fluid in the band. Denies any reflux. States that his symptoms are similar to when he had his gallbladder out for cholecystitis, and when he had a sigmoid colectomy for diverticulosis.  ? ?Abdominal surgical history: laparoscopic gastric banding, laparoscopic sigmoid colectomy, laparoscopic cholecystectomy ?Last colonoscopy 2016 ?Anticoagulants: none ? ?Review of Systems  ?Constitutional: Negative.   ?Gastrointestinal:  Positive for abdominal pain, constipation, nausea and vomiting.  ? ?All systems reviewed and otherwise negative except for as above ? ?Family History  ?Problem Relation Age of Onset  ? Heart disease Mother   ? Kidney Stones Other   ? Gallstones Other   ? ? ?Past Medical History:  ?Diagnosis Date  ? Arthritis   ? Diverticulitis   ? Kidney stones   ? ? ?Past Surgical History:  ?Procedure Laterality Date  ? CHOLECYSTECTOMY    ? LAPAROSCOPIC GASTRIC BANDING  11/18/2010  ? LAPAROSCOPIC SIGMOID COLECTOMY N/A 11/05/2014  ? Procedure: LAPAROSCOPIC SIGMOID COLECTOMY;  Surgeon: [name], MD;  Location: [facility] ORS;  Service: General;  Laterality: N/A;  ? TONSILLECTOMY    ? VASECTOMY  01/27/2007  ? ? ?Social History:  reports that he quit smoking about 8 years ago. His smoking use included cigarettes. He smoked an average of .25 packs per  day. He has never used smokeless tobacco. He reports that he does not currently use alcohol. He reports that he does not use drugs. ? ?Allergies: No Known Allergies ? ?Medications Prior to Admission  ?Medication Sig Dispense Refill  ? hyoscyamine (LEVSIN SL) 0.125 MG SL tablet Place 0.125 mg under the tongue every 4 (four) hours as needed for cramping.     ? Ibuprofen-diphenhydrAMINE Cit (ADVIL PM PO) Take 2 tablets by mouth daily as needed (pain).    ? ondansetron (ZOFRAN) 4 MG tablet Take 1 tablet (4 mg total) by mouth every 6 (six) hours. (Patient taking differently: Take 4 mg by mouth every 6 (six) hours as needed for nausea or vomiting.) 12 tablet 0  ? oxyCODONE (ROXICODONE) 5 MG immediate release tablet Take 1 tablet (5 mg total) by mouth every 6 (six) hours as needed for up to 10 days for breakthrough pain. 10 tablet 0  ? pantoprazole (PROTONIX) 40 MG tablet Take 40 mg by mouth daily.    ? traMADol (ULTRAM) 50 MG tablet Take 50 mg by mouth every 6 (six) hours as needed for moderate pain or severe pain.    ? methocarbamol (ROBAXIN) 500 MG tablet Take 1 tablet (500 mg total) by mouth at bedtime as needed for muscle spasms. (Patient not taking: Reported on 12/06/2019) 20 tablet 0  ? ? ?Prior to Admission medications   ?Medication Sig Start Date End Date Taking? Authorizing Provider  ?hyoscyamine (LEVSIN SL) 0.125 MG SL tablet Place 0.125 mg under the tongue every 4 (four) hours as needed for cramping.  04/22/15  Yes [provider]  ?Ibuprofen-diphenhydrAMINE Cit (ADVIL PM PO) Take 2 tablets by mouth daily as needed (pain).   Yes [provider]  ?ondansetron (ZOFRAN) 4 MG tablet Take 1 tablet (4 mg total) by mouth every 6 (six) hours. ?Patient taking differently: Take 4 mg by mouth every 6 (six) hours as needed for nausea or vomiting. 04/25/21  Yes Curatolo, Adam, DO  ?oxyCODONE (ROXICODONE) 5 MG immediate release tablet Take 1 tablet (5 mg total) by mouth every 6 (six) hours as needed for up to  10 days for breakthrough pain. 04/25/21 05/05/21 Yes Curatolo, Adam, DO  ?pantoprazole (PROTONIX) 40 MG tablet Take 40 mg by mouth daily.   Yes [provider]  ?traMADol (ULTRAM) 50 MG tablet Take 50 mg by mouth every 6 (six) hours as needed for moderate pain or severe pain.   Yes [provider]  ?methocarbamol (ROBAXIN) 500 MG tablet Take 1 tablet (500 mg total) by mouth at bedtime as needed for muscle spasms. ?Patient not taking: Reported on 12/06/2019 01/20/18   [name], PA-C  ? ? ?Blood pressure 107/70, pulse 89, temperature 98.8 ?F (37.1 ?C), temperature source Oral, resp. rate 15, height 5' 11.5" (1.816 m), weight 114.3 kg, SpO2 99 %. ?Physical Exam: ?General: pleasant, WD/WN male who is laying in bed in NAD ?Lungs: rate and effort normal ?Abd: soft, ND, NT ?Skin: warm and dry  ?Psych: A&Ox4 with an appropriate affect ?Neuro: MAEs ? ?Results for orders placed or performed during the hospital encounter of 04/27/21 (from the past 48 hour(s))  ?Lipase, blood     Status: None  ? Collection Time: 04/27/21 12:58 PM  ?Result Value Ref Range  ? Lipase 28 11 - 51 U/L  ?  Comment: Performed at [facility], [address] [place], [place] [address]  ?Comprehensive metabolic panel     Status: Abnormal  ? Collection Time: 04/27/21 12:58 PM  ?Result Value Ref Range  ? Sodium 135 135 - 145 mmol/L  ? Potassium 3.9 3.5 - 5.1 mmol/L  ? Chloride 103 98 - 111 mmol/L  ? CO2 21 (L) 22 - 32 mmol/L  ? Glucose, Bld 138 (H) 70 - 99 mg/dL  ?  Comment: Glucose reference range applies only to samples taken after fasting for at least 8 hours.  ? BUN 13 6 - 20 mg/dL  ? Creatinine, Ser 0.87 0.61 - 1.24 mg/dL  ? Calcium 9.2 8.9 - 10.3 mg/dL  ? Total Protein 7.7 6.5 - 8.1 g/dL  ? Albumin 4.6 3.5 - 5.0 g/dL  ? AST 21 15 - 41 U/L  ? ALT 21 0 - 44 U/L  ? Alkaline Phosphatase 71 38 - 126 U/L  ? Total Bilirubin 0.6 0.3 - 1.2 mg/dL  ? GFR, Estimated >60 >60 mL/min  ?  Comment: (NOTE) ?Calculated using  the CKD-EPI Creatinine Equation (2021) ?  ? Anion gap 11 5 - 15  ?  Comment: Performed at [facility], [address] [place], [place] [address]  ?CBC     Status: Abnormal  ? Collection Time: 04/27/21 12:58 PM  ?Result Value Ref Range  ? WBC 9.0 4.0 - 10.5 K/uL  ? RBC 5.22 4.22 - 5.81 MIL/uL  ? Hemoglobin 16.8 13.0 - 17.0 g/dL  ? HCT 46.5 39.0 - 52.0 %  ? MCV 89.1 80.0 - 100.0 fL  ? MCH 32.2 26.0 - 34.0 pg  ? MCHC 36.1 (H) 30.0 - 36.0 g/dL  ?  RDW 11.6 11.5 - 15.5 %  ? Platelets 246 150 - 400 K/uL  ? nRBC 0.0 0.0 - 0.2 %  ?  Comment: Performed at [facility], [address] [place], [place] [address]  ?Urinalysis, Routine w reflex microscopic     Status: Abnormal  ? Collection Time: 04/27/21  3:03 PM  ?Result Value Ref Range  ? Color, Urine YELLOW YELLOW  ? APPearance CLEAR CLEAR  ? Specific Gravity, Urine 1.034 (H) 1.005 - 1.030  ? pH 7.0 5.0 - 8.0  ? Glucose, UA NEGATIVE NEGATIVE mg/dL  ? Hgb urine dipstick SMALL (A) NEGATIVE  ? Bilirubin Urine NEGATIVE NEGATIVE  ? Ketones, ur 80 (A) NEGATIVE mg/dL  ? Protein, ur NEGATIVE NEGATIVE mg/dL  ? Nitrite NEGATIVE NEGATIVE  ? Leukocytes,Ua NEGATIVE NEGATIVE  ? RBC / HPF 0-5 0 - 5 RBC/hpf  ? WBC, UA 0-5 0 - 5 WBC/hpf  ? Bacteria, UA NONE SEEN NONE SEEN  ?  Comment: Performed at [facility], [address] [place], [place] [address]  ?Resp Panel by RT-PCR (Flu A&B, Covid) Nasopharyngeal Swab     Status: None  ? Collection Time: 04/27/21  4:35 PM  ? Specimen: Nasopharyngeal Swab; Nasopharyngeal(NP) swabs in vial transport medium  ?Result Value Ref Range  ? SARS Coronavirus 2 by RT PCR NEGATIVE NEGATIVE  ?  Comment: (NOTE) ?SARS-CoV-2 target nucleic acids are NOT DETECTED. ? ?The SARS-CoV-2 RNA is generally detectable in upper respiratory ?specimens during the acute phase of infection. The lowest ?concentration of SARS-CoV-2 viral copies this assay can detect is ?138 copies/mL. A negative result does not preclude  SARS-Cov-2 ?infection and should not be used as the sole basis for treatment or ?other patient management decisions. A negative result may occur with  ?improper specimen collection/handling, submissi

## 2021-04-28 NOTE — Plan of Care (Signed)
  Problem: Pain Managment: Goal: General experience of comfort will improve Outcome: Progressing   Problem: Coping: Goal: Level of anxiety will decrease Outcome: Progressing   

## 2021-04-28 NOTE — Progress Notes (Signed)
?  Progress Note ? ? ?Patient: Barry Parsons TXM:[id number] DOB: 1975-06-14 DOA: 04/27/2021     1 ?DOS: the patient was seen and examined on 04/28/2021 ?  ?Brief hospital course: ?Barry Parsons is a 46 y.o. 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 He presented to the ED on 03/31 with abdominal pain, nausea and vomiting, he was treated with antiemetic and analgesics therapy and discharged home. CT at that time of the abdomen and pelvis not acute changes.  His symptoms started 3 weeks ago, with intermittent nausea, vomiting and abdominal pain. He has been using GLP1 agonist [facility]) injection for weight loss every week and recently increased the dose from 0.5 mg that he took for 3 weeks to 1 mg that he took for 1 week before his symptoms worsened. Last time he had the injection was on 04/18/21.  ? ?Assessment and Plan: ?Intractable nausea and vomiting ?Secondary to GLP1 agonist versus gastroenteritis. General surgery consulted. No evidence of Lap-Band slip. Continue supportive medical care with IV fluids, IV antiacids, analgesics, etc. Mild fever and bump in WBC this morning will start empiric intra-abdominal coverage with Rocephin and Flagyl. Full liquid diet currently. Obtain mag/phos. ? ?Class 1 obesity s/p Lap-Band in 2013 ?Calculated BMI is 34.6 per EMR ? ?Dyslipidemia ?Patient not on statin therapy at home ? ?Essential hypertension ?His blood pressure is elevated, likely due to nausea and vomiting. ?Plan to continue close monitoring and add IV hydralazine as needed in case systolic blood pressure more than 190 mmHg. Does not appear to be on any oral agents at home. ? ?Subjective: Still having abdominal pain but better. Mild fever overnight. No active emesis. Passing gas. No BM since he  hasn't much PO intake the last several  days. ? ?Physical Exam: ?Vitals:  ? 04/28/21 0157 04/28/21 0554 04/28/21 0952 04/28/21 1317  ?BP: 135/79 126/88 129/75 107/70  ?Pulse: (!) 105 (!) 110 86 89  ?Resp: 20 20 15 15   ?Temp: 99.4 ?F (37.4 ?C) 98.8 ?F (37.1 ?C) 97.6 ?F (36.4 ?C) 98.8 ?F (37.1 ?C)  ?TempSrc: Oral Oral  Oral  ?SpO2: 100% 99% 99% 99%  ?Weight:      ?Height:      ? ?General: pleasant, WD/WN male who is laying in bed in NAD ?Lungs: rate and effort normal ?Abd: soft, ND, NT ?Skin: warm and dry  ?Psych: A&Ox4 with an appropriate affect ?Neuro: MAEs ? ?Data Reviewed: ?Labs, vital signs, imaging ? ?Family Communication: No family present at bedside ? ?Disposition: ?Status is: Inpatient ?Remains inpatient appropriate because: Monitoring toleration diet and IV antibiotics ?Planned Discharge Destination: Home ?DVT prophylaxis: Lovenox subcu ? ?Time spent: 50 minutes ? ?Author: ? , DO ?04/28/2021 4:03 PM ? ?For on call review [email or web address].  ?

## 2021-04-29 DIAGNOSIS — R112 Nausea with vomiting, unspecified: Secondary | ICD-10-CM | POA: Diagnosis not present

## 2021-04-29 LAB — CBC
HCT: 43.7 % (ref 39.0–52.0)
Hemoglobin: 15.4 g/dL (ref 13.0–17.0)
MCH: 31.9 pg (ref 26.0–34.0)
MCHC: 35.2 g/dL (ref 30.0–36.0)
MCV: 90.5 fL (ref 80.0–100.0)
Platelets: 222 10*3/uL (ref 150–400)
RBC: 4.83 MIL/uL (ref 4.22–5.81)
RDW: 11.7 % (ref 11.5–15.5)
WBC: 10.3 10*3/uL (ref 4.0–10.5)
nRBC: 0 % (ref 0.0–0.2)

## 2021-04-29 LAB — BASIC METABOLIC PANEL
Anion gap: 7 (ref 5–15)
BUN: <5 mg/dL — ABNORMAL LOW (ref 6–20)
CO2: 21 mmol/L — ABNORMAL LOW (ref 22–32)
Calcium: 8.7 mg/dL — ABNORMAL LOW (ref 8.9–10.3)
Chloride: 108 mmol/L (ref 98–111)
Creatinine, Ser: 0.69 mg/dL (ref 0.61–1.24)
GFR, Estimated: >60 mL/min (ref >60)
Glucose, Bld: 133 mg/dL — ABNORMAL HIGH (ref 70–99)
Potassium: 3.8 mmol/L (ref 3.5–5.1)
Sodium: 136 mmol/L (ref 135–145)

## 2021-04-29 MED ORDER — ENSURE MAX PROTEIN PO LIQD
11.0000 [oz_av] | Freq: Every day | ORAL | Status: DC
  Administered 2021-04-30: 11 [oz_av] via ORAL

## 2021-04-29 MED ORDER — MELATONIN 3 MG PO TABS
3.0000 mg | ORAL_TABLET | Freq: Every evening | ORAL | Status: DC | PRN
  Administered 2021-04-29: 3 mg via ORAL
  Filled 2021-04-29: qty 1

## 2021-04-29 NOTE — Progress Notes (Signed)
?PROGRESS NOTE ? ?Barry Parsons  ?DOB: [birth year]  ?PCP: Merri Brunette, MD ?AJO:[id number]  ?DOA: 04/27/2021 ? LOS: 2 days  ?Hospital Day: 3 ? ?Brief narrative: ?Barry Parsons is a 46 y.o. 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 ?Patient started having intermittent nausea, vomiting and abdominal pain about 3 weeks ago after he started taking GLP-1 agonist Wegovy weekly injection for weight loss. ?Patient presented to the ED on 3/31 with complaint of abdominal pain, nausea, vomiting.  CT scan abdomen at that time did not show any acute changes.  He was treated with antiemetic and analgesics and was discharged home. ?Symptoms continued and actually worsened at home.  He was not able to hold down any food or drink and hence returned back to ED on 4/2. ? ?In the ED, he was slightly tachycardic, electrolytes normal, renal function normal, CBC unremarkable ?CT abdomen did not show any acute intra-abdominal pathology except for mild hepatic steatosis ?Because of the severity of his symptoms, he was admitted to hospitalist service ?General surgery consultation was obtained as well. ? ?Subjective: ?Patient was seen and examined this morning.  Middle-aged Caucasian male.  Looks miserable.  Feels miserable.  He states that last night, soon after he took full liquid, he started going up again.  Had several episodes of emesis overnight.  Also started having worsening left lower quadrant pain.  Had a bowel movement last night.  Last BM before that was 1 week ago. ?General surgery following. ? ?Principal Problem: ?  Intractable nausea and vomiting ?Active Problems: ?  Essential hypertension ?  Dyslipidemia ?  Class 1 obesity ?  ? ? ?Assessment and Plan: ?Intractable nausea and vomiting ?History of laparoscopic gastric banding ?-Presented with 3 weeks of nausea, vomiting, ankle pain progressively worsening. ?-His symptoms  apparently started after he started using weekly injection of GLP-1 agonist Wegovy.  ?-CT abdomen did not show any intra-abdominal pathology. ?-Patient continues to have intense vomiting and abdominal pain.  It is probably because of long half-life of the medicine. ?-Currently patient is only able to sip clear liquid ?-Continue pain management. ?-Continue IV fluid, Protonix, antacids, analgesics ?-Dietitian consult ?-On empiric antibiotic IV Rocephin and IV Flagyl.  Defer to surgery for indication and duration. ? ?Class 1 obesity ?Calculated BMI is 34.6  ? ?Dyslipidemia ?-Patient not on statin therapy at home.  ?-Update lipid panel as an outpatient.  Consideration of statin if needed. ? ?Essential hypertension ?-Blood pressure running elevated likely because of pain and stress of vomiting. ?-No history of hypertension.  Not on blood pressure meds at home. ?-Continue to monitor blood pressure. ? ?Goals of care ?  Code Status: Full Code  ? ? ?Mobility: Encourage ambulation ? ?Nutritional status:  ?Body mass index is 34.66 kg/m?.  ?  ?  ? ? ? ? ?Diet:  ?Diet Order   ? ?       ?  Diet full liquid Room service appropriate? Yes; Fluid consistency: Thin  Diet effective now       ?  ? ?  ?  ? ?  ? ? ?DVT prophylaxis:  ?SCDs Start: 04/27/21 1703 ?  ?Antimicrobials: IV Rocephin and IV Flagyl ?Fluid: D5 NS at 100 mill per hour ?Consultants: General surgery ?Family Communication: None at bedside ? ?Status is: Inpatient ? ?Continue in-hospital care because: Ongoing intractable vomiting, abdominal pain ?Level of care: Med-Surg  ? ?Dispo: The patient is from:  Home ?             Anticipated d/c is to: Pending clinical course.  Hopefully Home in 2 to 3 days ?             Patient currently is not medically stable to d/c. ?  Difficult to place patient No ? ? ? ? ?Infusions:  ? cefTRIAXone (ROCEPHIN)  IV 2 g (04/29/21 0909)  ? dextrose 5 % and 0.9% NaCl 100 mL/hr at 04/29/21 0221  ? metronidazole 500 mg (04/29/21 1107)  ? ? ?Scheduled  Meds: ? enoxaparin (LOVENOX) injection  60 mg Subcutaneous Q24H  ? feeding supplement  1 Container Oral TID BM  ? metoCLOPramide (REGLAN) injection  5 mg Intravenous Q8H  ? pantoprazole (PROTONIX) IV  40 mg Intravenous Daily  ? ? ?PRN meds: ?acetaminophen **OR** acetaminophen, hydrALAZINE, hyoscyamine, morphine injection, ondansetron (ZOFRAN) IV  ? ?Antimicrobials: ?Anti-infectives (From admission, onward)  ? ? Start     Dose/Rate Route Frequency Ordered Stop  ? 04/28/21 1045  cefTRIAXone (ROCEPHIN) 2 g in sodium chloride 0.9 % 100 mL IVPB       ? 2 g ?200 mL/hr over 30 Minutes Intravenous Daily 04/28/21 0948    ? 04/28/21 1015  metroNIDAZOLE (FLAGYL) IVPB 500 mg       ? 500 mg ?100 mL/hr over 60 Minutes Intravenous Every 12 hours 04/28/21 0929 05/03/21 1014  ? ?  ? ? ?Objective: ?Vitals:  ? 04/28/21 2129 04/29/21 0601  ?BP: (!) 152/91 (!) 169/100  ?Pulse: 76 80  ?Resp: 18 18  ?Temp: 99 ?F (37.2 ?C) 99 ?F (37.2 ?C)  ?SpO2: 100% 96%  ? ? ?Intake/Output Summary (Last 24 hours) at 04/29/2021 1112 ?Last data filed at 04/29/2021 4765 ?Gross per 24 hour  ?Intake 2853.93 ml  ?Output 0 ml  ?Net 2853.93 ml  ? ?Filed Weights  ? 04/27/21 1225  ?Weight: 114.3 kg  ? ?Weight change:  ?Body mass index is 34.66 kg/m?.  ? ?Physical Exam: ?General exam: Pleasant, middle-aged Caucasian male.  Mild to moderate distress because of abdominal pain and vomiting ?Skin: No rashes, lesions or ulcers. ?HEENT: Atraumatic, normocephalic, no obvious bleeding ?Lungs: Clear to auscultation bilaterally ?CVS: Regular rate and rhythm, no murmur ?GI/Abd soft, mild diffuse tenderness, bowel sound present ?CNS: Alert, awake, oriented x3 ?Psychiatry: Sad affect ?Extremities: No pedal edema, no calf tenderness ? ?Data Review: I have personally reviewed the laboratory data and studies available. ? ?F/u labs ordered ?Unresulted Labs (From admission, onward)  ? ?  Start     Ordered  ? 05/04/21 0500  Creatinine, serum  (enoxaparin (LOVENOX)    CrCl >/= 30 ml/min)   Weekly,   R     ?Comments: while on enoxaparin therapy ?  ? 04/27/21 1702  ? Unscheduled  CBC with Differential/Platelet  Tomorrow morning,   R       ? 04/29/21 1112  ? Unscheduled  Basic metabolic panel  Tomorrow morning,   R       ? 04/29/21 1112  ? ?  ?  ? ?  ? ? ?Signed, ?Lorin Glass, MD ?[facility] ?04/29/2021 ? ? ? ? ? ? ? ? ? ? ? ? ?

## 2021-04-29 NOTE — Consult Note (Signed)
Referring Provider: TRH ?Primary Care Physician:  Merri Brunette, MD ?Primary Gastroenterologist:  Gentry Fitz ? ?Reason for Consultation:  LLQ abdominal pain, nausea, vomiting ? ?HPI: Barry Parsons is a 46 y.o. male with medical history significant of obesity s/p lap band placement 2012, hypertension and dyslipidemia. He presented to the ED on 03/31 with abdominal pain, nausea and vomiting.  CT at that time of the abdomen and pelvis not acute changes. Symptoms continued to worsen at home and he returned to ED on 4/2. ? ?His most recent symptoms started 3 weeks ago, with intermittent nausea, vomiting and abdominal pain. Notes that he would have episode of emesis 10 minutes after eating. He would have emesis of solids and liquids. Denies blood in his emesis. Notes his abdominal pain is located in the LLQ. Worse after eating. Not associated with bowel movements. He notes the pain feels similar to when he had the pain prior to his partial colectomy.   He has been using GP1 agonist (semaglutide) injection for weight loss and recently increased the dose for 1 week before his symptoms worsened. Last injection was on 04/18/21. Notes he was constipated prior to his symptom onset but his bowel movements have been small but regular since.  ? ?Past history significant for lap band placement 2012, a couple of years after surgery he developed episodic pain, nausea, vomiting, and diarrhea. He eventually had a sigmoid colon resection in 2016, without finding of diverticulitis on the surgical pathology specimen. He had x 9 abdominal CT scans over 2 years before and after his surgery, 2 of which showed possible early mild inflammation. After resection he had persistent episodes of pain, and cholelithiasis for which he had a cholecystectomy, 07/2015, which offered no relief. ? ?He was previously treated with anti-spasmodics which may have contributed to his symptom-free interval. [facility] Kinnie Scales suspected  Functional etiology of pain suggested by the 7 year duration of his symptoms, lack of objective findings, and failure to respond to surgical and medical interventions thus far.  ? ?Last colonoscopy at [facility] in 05/2017 ?CECUM, BIOPSIES:cecal mucosa with no significant histopathologic ?abnormalities. No morphologic evidence of microscopic colitis. ?COLON, TRANSVERSE, BIOPSIES: Colonic mucosa with no significant histopathologic ?abnormalities. No morphologic evidence of microscopic colitis. ? ?Denies family history of colon cancer ?Most recent colonoscopy was  ?Denies NSAID usage ?Denies alcohol usage ? ?Past Medical History:  ?Diagnosis Date  ? Arthritis   ? Diverticulitis   ? Kidney stones   ? ? ?Past Surgical History:  ?Procedure Laterality Date  ? CHOLECYSTECTOMY    ? LAPAROSCOPIC GASTRIC BANDING  11/18/2010  ? LAPAROSCOPIC SIGMOID COLECTOMY N/A 11/05/2014  ? Procedure: LAPAROSCOPIC SIGMOID COLECTOMY;  Surgeon: [name], MD;  Location: [facility];  Service: General;  Laterality: N/A;  ? TONSILLECTOMY    ? VASECTOMY  01/27/2007  ? ? ?Prior to Admission medications   ?Medication Sig Start Date End Date Taking? Authorizing Provider  ?hyoscyamine (LEVSIN SL) 0.125 MG SL tablet Place 0.125 mg under the tongue every 4 (four) hours as needed for cramping.  04/22/15  Yes [provider]  ?Ibuprofen-diphenhydrAMINE Cit (ADVIL PM PO) Take 2 tablets by mouth daily as needed (pain).   Yes [provider]  ?ondansetron (ZOFRAN) 4 MG tablet Take 1 tablet (4 mg total) by mouth every 6 (six) hours. ?Patient taking differently: Take 4 mg by mouth every 6 (six) hours as needed for nausea or vomiting. 04/25/21  Yes Curatolo, Adam, DO  ?oxyCODONE (ROXICODONE) 5  MG immediate release tablet Take 1 tablet (5 mg total) by mouth every 6 (six) hours as needed for up to 10 days for breakthrough pain. 04/25/21 05/05/21 Yes Curatolo, Adam, DO  ?pantoprazole (PROTONIX) 40 MG tablet Take 40 mg by mouth daily.   Yes  [provider]  ?traMADol (ULTRAM) 50 MG tablet Take 50 mg by mouth every 6 (six) hours as needed for moderate pain or severe pain.   Yes [provider]  ?WEGOVY 1 MG/0.5ML SOAJ Inject 1 mg into the skin once a week. 03/31/21  Yes [provider]  ?methocarbamol (ROBAXIN) 500 MG tablet Take 1 tablet (500 mg total) by mouth at bedtime as needed for muscle spasms. ?Patient not taking: Reported on 12/06/2019 01/20/18   Jacinto HalimMaczis, Michael M, PA-C  ? ? ?Scheduled Meds: ? enoxaparin (LOVENOX) injection  60 mg Subcutaneous Q24H  ? metoCLOPramide (REGLAN) injection  5 mg Intravenous Q8H  ? pantoprazole (PROTONIX) IV  40 mg Intravenous Daily  ? Ensure Max Protein  11 oz Oral Daily  ? ?Continuous Infusions: ? cefTRIAXone (ROCEPHIN)  IV 2 g (04/29/21 0909)  ? dextrose 5 % and 0.9% NaCl 100 mL/hr at 04/29/21 1353  ? metronidazole 500 mg (04/29/21 1107)  ? ?PRN Meds:.acetaminophen **OR** acetaminophen, hydrALAZINE, hyoscyamine, morphine injection, ondansetron (ZOFRAN) IV ? ?Allergies as of 04/27/2021  ? (No Known Allergies)  ? ? ?Family History  ?Problem Relation Age of Onset  ? Heart disease Mother   ? Kidney Stones Other   ? Gallstones Other   ? ? ?Social History  ? ?Socioeconomic History  ? Marital status: Married  ?  Spouse name: Not on file  ? Number of children: Not on file  ? Years of education: Not on file  ? Highest education level: Not on file  ?Occupational History  ? Not on file  ?Tobacco Use  ? Smoking status: Former  ?  Packs/day: 0.25  ?  Types: Cigarettes  ?  Quit date: 01/26/2013  ?  Years since quitting: 8.2  ? Smokeless tobacco: Never  ?Vaping Use  ? Vaping Use: Never used  ?Substance and Sexual Activity  ? Alcohol use: Not Currently  ? Drug use: No  ? Sexual activity: Not on file  ?Other Topics Concern  ? Not on file  ?Social History Narrative  ? Not on file  ? ?Social Determinants of Health  ? ?Financial Resource Strain: Not on file  ?Food Insecurity: Not on file  ?Transportation Needs:  Not on file  ?Physical Activity: Not on file  ?Stress: Not on file  ?Social Connections: Not on file  ?Intimate Partner Violence: Not on file  ? ? ?Review of Systems: Review of Systems  ?Constitutional:  Negative for chills and fever.  ?HENT:  Negative for hearing loss and tinnitus.   ?Eyes:  Negative for blurred vision and double vision.  ?Respiratory:  Negative for cough and hemoptysis.   ?Cardiovascular:  Negative for chest pain and palpitations.  ?Gastrointestinal:  Positive for abdominal pain, nausea and vomiting. Negative for blood in stool, constipation, diarrhea, heartburn and melena.  ?Genitourinary:  Negative for dysuria and urgency.  ?Musculoskeletal:  Negative for myalgias and neck pain.  ?Skin:  Negative for rash.  ?Neurological:  Negative for dizziness and headaches.  ?Endo/Heme/Allergies:  Negative for environmental allergies. Does not bruise/bleed easily.  ?Psychiatric/Behavioral:  Negative for depression and substance abuse.   ? ? ?Physical Exam: ?Physical Exam ?Constitutional:   ?   General: He is not in acute distress. ?  Appearance: Normal appearance. He is normal weight. He is not ill-appearing or toxic-appearing.  ?HENT:  ?   Head: Normocephalic.  ?   Right Ear: External ear normal.  ?   Left Ear: External ear normal.  ?   Nose: Nose normal.  ?   Mouth/Throat:  ?   Mouth: Mucous membranes are moist.  ?Eyes:  ?   Conjunctiva/sclera: Conjunctivae normal.  ?   Pupils: Pupils are equal, round, and reactive to light.  ?Cardiovascular:  ?   Rate and Rhythm: Normal rate and regular rhythm.  ?   Pulses: Normal pulses.  ?   Heart sounds: Normal heart sounds.  ?Pulmonary:  ?   Effort: Pulmonary effort is normal.  ?   Breath sounds: Normal breath sounds.  ?Abdominal:  ?   General: Abdomen is flat. Bowel sounds are normal. There is no distension.  ?   Palpations: Abdomen is soft. Mass: can palpate gastric band.  ?   Tenderness: There is abdominal tenderness (to palpation of the lower abdomen). There is no  guarding or rebound.  ?   Hernia: No hernia is present.  ?Musculoskeletal:     ?   General: No swelling. Normal range of motion.  ?   Cervical back: Normal range of motion.  ?Skin: ?   General: Skin is w

## 2021-04-29 NOTE — Progress Notes (Signed)
Lap Band Fluid Removal Note ? ?Patient had been having persistent nausea and intermittent vomiting for several weeks.  He has a history of laparoscopic adjustable gastric band placement from many years ago by Dr. Johna Sheriff.  He had a APL band placed.  Because of his ongoing nausea and intermittent vomiting I recommended taking all fluid out from his Lap-Band to see if this is the source of some of his symptoms.  After obtaining informed consent the patient's right midabdomen was prepped with alcohol.  Then using a laparoscopic adjustable gastric band needle I accessed the port on the first attempt.  A total of 10 cc of saline was removed.  Patient could tell an immediate difference.  He tolerated the procedure well.  No immediate complications. ? ?Mary Sella. Andrey Campanile, MD, FACS ?General, Bariatric, & Minimally Invasive Surgery ?[facility], [place] ? ?

## 2021-04-29 NOTE — Progress Notes (Incomplete)
Past history significant for lap band placement 2012, before which he had no abdominal distress, which subsequently developed. A couple of years later his symptoms of episodic pain, nausea, vomiting, and diarrhea began. This led to sigmoid colon resection in 2016, without finding of diverticulitis on the surgical pathology specimen. In fact, he had x 9 abdominal CT scans over 2 years before and after his surgery, only 2 of which showed possible early mild inflammation. Persistent episodes of pain, and cholelithiasis, then led to cholecystectomy, 07/2015, which offered no relief. ? ?Discrete episodes of abdominal pain, nausea, vomiting, and diarrhea developed following lap band surgery. Subsequent sigmoid resection for presumptive diverticulitis and cholecystectomy for presumptive calculous cholecystitis did not alter the course of his symptoms. He was treated with anti-spasmodics which may have contributed to his symptom-free interval. Functional etiology is suggested by the 7 year duration of his symptoms, lack of objective findings, and failure to respond to surgical and medical interventions thus far.  ?

## 2021-04-29 NOTE — Progress Notes (Signed)
?  Transition of Care (TOC) Screening Note ? ? ?Patient Details  ?Name: Barry Parsons ?Date of Birth: 27-Mar-1975 ? ? ?Transition of Care [facility]) CM/SW Contact:    ?Lanier Clam, RN ?Phone Number: ?04/29/2021, 9:19 AM ? ? ? ?Transition of Care Department [facility]) has reviewed patient and no TOC needs have been identified at this time. We will continue to monitor patient advancement through interdisciplinary progression rounds. If new patient transition needs arise, please place a TOC consult. ?  ?

## 2021-04-29 NOTE — Progress Notes (Signed)
? ?Progress Note ? ?   ?Subjective: ?He had a difficult night with worsening LLQ abdominal pain. He also had episodes of diarrhea he describes as black in color as well as several episodes of emesis. Prior to diarrhea overnight last BM about one week ago. He has been sipping on water and so far no further emesis since 0300 ? ? ?Objective: ?Vital signs in last 24 hours: ?Temp:  [97.6 ?F (36.4 ?C)-99 ?F (37.2 ?C)] 99 ?F (37.2 ?C) (04/04 0601) ?Pulse Rate:  [76-89] 80 (04/04 0601) ?Resp:  [15-18] 18 (04/04 0601) ?BP: (107-169)/(70-100) 169/100 (04/04 0601) ?SpO2:  [96 %-100 %] 96 % (04/04 0601) ?Last BM Date : 04/28/21 ? ?Intake/Output from previous day: ?04/03 0701 - 04/04 0700 ?In: 2853.9 [P.O.:480; I.V.:2073.9; IV Piggyback:300] ?Out: 0  ?Intake/Output this shift: ?No intake/output data recorded. ? ?PE: ?General: pleasant, WD, male who is laying in bed in NAD ?HEENT: head is normocephalic, atraumatic. Mouth is pink and moist ?Heart: Palpable radial pulses bilaterally ?Lungs:  Respiratory effort nonlabored on room air ?Abd: soft, ND, +BS, mild TTP diffusely on left side of abdomen but greatest in LLQ without rebound or guarding ?MSK: all 4 extremities are symmetrical with no cyanosis, clubbing, or edema. ?Skin: warm and dry ?Psych: A&Ox3 with an appropriate affect.  ? ? ?Lab Results:  ?Recent Labs  ?  04/28/21 ?0303 04/29/21 ?0404  ?WBC 11.4* 10.3  ?HGB 15.6 15.4  ?HCT 43.2 43.7  ?PLT 247 222  ? ?BMET ?Recent Labs  ?  04/28/21 ?0303 04/29/21 ?0404  ?NA 135 136  ?K 3.3* 3.8  ?CL 106 108  ?CO2 21* 21*  ?GLUCOSE 118* 133*  ?BUN 7 <5*  ?CREATININE 0.76 0.69  ?CALCIUM 8.8* 8.7*  ? ?PT/INR ?No results for input(s): LABPROT, INR in the last 72 hours. ?CMP  ?   ?Component Value Date/Time  ? NA 136 04/29/2021 0404  ? K 3.8 04/29/2021 0404  ? CL 108 04/29/2021 0404  ? CO2 21 (L) 04/29/2021 0404  ? GLUCOSE 133 (H) 04/29/2021 0404  ? BUN <5 (L) 04/29/2021 0404  ? CREATININE 0.69 04/29/2021 0404  ? CREATININE 0.78 09/12/2010  1100  ? CALCIUM 8.7 (L) 04/29/2021 0404  ? PROT 7.7 04/27/2021 1258  ? ALBUMIN 4.6 04/27/2021 1258  ? AST 21 04/27/2021 1258  ? ALT 21 04/27/2021 1258  ? ALKPHOS 71 04/27/2021 1258  ? BILITOT 0.6 04/27/2021 1258  ? GFRNONAA >60 04/29/2021 0404  ? GFRAA >60 01/03/2016 1440  ? ?Lipase  ?   ?Component Value Date/Time  ? LIPASE 28 04/27/2021 1258  ? ? ? ? ? ?Studies/Results: ?CT Abdomen Pelvis W Contrast ? ?Result Date: 04/27/2021 ?CLINICAL DATA:  46 year old male with acute LEFT abdominal and pelvic pain with vomiting for 1 and half weeks. EXAM: CT ABDOMEN AND PELVIS WITH CONTRAST TECHNIQUE: Multidetector CT imaging of the abdomen and pelvis was performed using the standard protocol following bolus administration of intravenous contrast. RADIATION DOSE REDUCTION: This exam was performed according to the departmental dose-optimization program which includes automated exposure control, adjustment of the mA and/or kV according to patient size and/or use of iterative reconstruction technique. CONTRAST:  OMNIPAQUE IOHEXOL 300 MG/ML  SOLN COMPARISON:  04/25/2021 and prior CTs FINDINGS: Lower chest: No acute abnormality. Hepatobiliary: Probable mild hepatic steatosis again noted without focal hepatic abnormality. The patient is status post cholecystectomy. No biliary dilatation. Pancreas: Unremarkable Spleen: Unremarkable Adrenals/Urinary Tract: No significant renal, adrenal or bladder abnormalities are noted. Stomach/Bowel: Lap band apparatus identified without  gross complicating features. The stomach is otherwise unremarkable. Distal colonic surgical changes are identified. There is no evidence of bowel obstruction, definite bowel wall thickening or inflammatory changes. The appendix is normal. Colonic diverticulosis noted without evidence of acute diverticulitis. Vascular/Lymphatic: No significant vascular findings are present. No enlarged abdominal or pelvic lymph nodes. Reproductive: Prostate is unremarkable. Other:  No ascites, focal collection or pneumoperitoneum. Musculoskeletal: No acute or suspicious bony abnormalities are noted. IMPRESSION: 1. No evidence of acute abnormality. 2. Probable mild hepatic steatosis. 3. Lap band apparatus without gross complicating features. Electronically Signed   By: Harmon Pier M.D.   On: 04/27/2021 14:02   ? ?Anti-infectives: ?Anti-infectives (From admission, onward)  ? ? Start     Dose/Rate Route Frequency Ordered Stop  ? 04/28/21 1045  cefTRIAXone (ROCEPHIN) 2 g in sodium chloride 0.9 % 100 mL IVPB       ? 2 g ?200 mL/hr over 30 Minutes Intravenous Daily 04/28/21 0948    ? 04/28/21 1015  metroNIDAZOLE (FLAGYL) IVPB 500 mg       ? 500 mg ?100 mL/hr over 60 Minutes Intravenous Every 12 hours 04/28/21 0929 05/03/21 1014  ? ?  ? ? ? ?Assessment/Plan ?Abdominal pain, nausea, vomiting ?Hx laparoscopic gastric banding ?- CT scan negative for any acute intraabdominal pathology, it shows the lap band without any complicating factors.  ?- No indication for acute surgical intervention ?- Suspect this may be a side effect of the Wagovy. The half life of this medication is 1 week but can take up to 5 weeks to completely clear the system.  ?- continue liquid diet ?- dietician to educate patient on low fat diet.  ?- consider removal of some fluid from his lap band today to possibly help with fluid intake ?  ?ID - rocephin/flagyl ?VTE - lovenox ?FEN - FLD, Boost ?Foley - none ?  ?Obesity BMI 34.66 ? ?I reviewed last 24 h vitals and pain scores, last 48 h intake and output, and last 24 h labs and trends. ?  ? ? LOS: 2 days  ? ?Eric Form, PA-C ?[facility] ?04/29/2021, 8:58 AM ?Please see Amion for pager number during day hours 7:00am-4:30pm  ?

## 2021-04-29 NOTE — Progress Notes (Signed)
Initial Nutrition Assessment ? ?DOCUMENTATION CODES:  ? ?Obesity unspecified ? ?INTERVENTION:  ? ?-Provided "Fat Restricted Nutrition Therapy" handout to patient ? ?-Ensure MAX Protein po daily, each supplement provides 150 kcal and 30 grams of protein  ? ?-Multivitamin with minerals daily ? ?NUTRITION DIAGNOSIS:  ? ?Inadequate oral intake related to nausea, vomiting as evidenced by per patient/family report. ? ?GOAL:  ? ?Patient will meet greater than or equal to 90% of their needs ? ?MONITOR:  ? ?PO intake, Supplement acceptance, Labs, Weight trends, I & O's ? ?REASON FOR ASSESSMENT:  ? ?Consult ?Diet education ? ?ASSESSMENT:  ? ?46 y.o. 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  Patient started having intermittent nausea, vomiting and abdominal pain about 3 weeks ago after he started taking GLP-1 agonist Wegovy weekly injection for weight loss. ? ?Patient in room, seems uncomfortable. Reports he is still feeling bad with some abdominal pain. States he tried to eat grits with juice and ginger ale yesterday and this caused N/V to start again. Has not eaten anything solid for 2 weeks now. Mainly subsisting on water and Atkins shakes.  ?States he does not like the Boost Breeze supplements, will switch to Ensure Max when pt is able to tolerate PO. Per surgery note, plan is for fluid removal of gastric band today to help his toleration of fluids. Will need GI to see.  ? ?Per pt, started [facility] injections with higher dose 3 weeks ago and this is when N/V began. Prior to that pt states he wasn't eating a lot, maybe 1 meal or 1/2 meal twice daily. Mainly sandwiches or small meals.  ? ?Surgery had recommended pt limit fat in diet while receiving Wegovy. Left "Fat restricted diet" handout in room. States he will review it with his wife later.  ? ?Per weight records, no weight loss noted. ? ?Medications: Reglan, D5 infusion,  IV Zofran ? ?Labs reviewed. ? ?NUTRITION - FOCUSED PHYSICAL EXAM: ? ?No depletions noted. ? ?Diet Order:   ?Diet Order   ? ?       ?  Diet full liquid Room service appropriate? Yes; Fluid consistency: Thin  Diet effective now       ?  ? ?  ?  ? ?  ? ? ?EDUCATION NEEDS:  ? ?Education needs have been addressed ? ?Skin:  Skin Assessment: Reviewed RN Assessment ? ?Last BM:  4/3 ? ?Height:  ? ?Ht Readings from Last 1 Encounters:  ?04/27/21 5' 11.5" (1.816 m)  ? ? ?Weight:  ? ?Wt Readings from Last 1 Encounters:  ?04/27/21 114.3 kg  ? ? ?BMI:  Body mass index is 34.66 kg/m?. ? ?Estimated Nutritional Needs:  ? ?Kcal:  1950-2150 ? ?Protein:  100-110g ? ?Fluid:  2L/day ? ?Tilda Franco, MS, RD, LDN ?Inpatient Clinical Dietitian ?Contact information available via Amion ? ?

## 2021-04-30 LAB — BASIC METABOLIC PANEL
Anion gap: 8 (ref 5–15)
BUN: 8 mg/dL (ref 6–20)
CO2: 23 mmol/L (ref 22–32)
Calcium: 8.9 mg/dL (ref 8.9–10.3)
Chloride: 108 mmol/L (ref 98–111)
Creatinine, Ser: 0.75 mg/dL (ref 0.61–1.24)
GFR, Estimated: >60 mL/min (ref >60)
Glucose, Bld: 105 mg/dL — ABNORMAL HIGH (ref 70–99)
Potassium: 3.2 mmol/L — ABNORMAL LOW (ref 3.5–5.1)
Sodium: 139 mmol/L (ref 135–145)

## 2021-04-30 LAB — CBC WITH DIFFERENTIAL/PLATELET
Abs Immature Granulocytes: 0.01 10*3/uL (ref 0.00–0.07)
Basophils Absolute: 0.1 10*3/uL (ref 0.0–0.1)
Basophils Relative: 1 %
Eosinophils Absolute: 0.1 10*3/uL (ref 0.0–0.5)
Eosinophils Relative: 1 %
HCT: 41.3 % (ref 39.0–52.0)
Hemoglobin: 14.7 g/dL (ref 13.0–17.0)
Immature Granulocytes: 0 %
Lymphocytes Relative: 39 %
Lymphs Abs: 2.7 10*3/uL (ref 0.7–4.0)
MCH: 32.5 pg (ref 26.0–34.0)
MCHC: 35.6 g/dL (ref 30.0–36.0)
MCV: 91.2 fL (ref 80.0–100.0)
Monocytes Absolute: 0.6 10*3/uL (ref 0.1–1.0)
Monocytes Relative: 8 %
Neutro Abs: 3.6 10*3/uL (ref 1.7–7.7)
Neutrophils Relative %: 51 %
Platelets: 206 10*3/uL (ref 150–400)
RBC: 4.53 MIL/uL (ref 4.22–5.81)
RDW: 11.9 % (ref 11.5–15.5)
WBC: 7 10*3/uL (ref 4.0–10.5)
nRBC: 0 % (ref 0.0–0.2)

## 2021-04-30 MED ORDER — POTASSIUM CHLORIDE CRYS ER 20 MEQ PO TBCR
30.0000 meq | EXTENDED_RELEASE_TABLET | ORAL | Status: AC
  Administered 2021-04-30 (×2): 30 meq via ORAL
  Filled 2021-04-30 (×2): qty 1

## 2021-04-30 NOTE — Discharge Summary (Signed)
?Physician Discharge Summary  ?Barry Parsons HQP:[id number] DOB: 11/16/1975 DOA: 04/27/2021 ? ?PCP: Merri Brunette, MD ? ?Admit date: 04/27/2021 ?Discharge date: 04/30/2021 ? ?Admitted From: Home ?Disposition: Home ? ?Recommendations for Outpatient Follow-up:  ?Follow up with PCP in 1-2 weeks ?Follow-up with [facility] in 3-weeks for reevaluation of Lap-Band and fluid replacement ?Follow-up with gastroenterology outpatient for further work-up of functional bowel pain ?Discontinued [id number] ? ?Home Health: No ?Equipment/Devices: None ? ?Discharge Condition: Stable ?CODE STATUS: Full code ?Diet recommendation: Heart healthy diet ? ?History of present illness: ? 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  Onset roughly 3 weeks ago after he started GLP-1 agonist Wegovy weekly injection for weight loss.  Patient initially was seen in ED on 3/31 with the same complaint, CT abdomen at that time did not show any acute changes, patient was treated with antibiotic, analgesics and was discharged home.  Since returning home, patient's symptoms progressively worsened and was not able to hold down any food or drink prompting return to the ED on 4/2. ?  ?In the ED, temperature 98.6 ?F, HR 107, RR 18, BP 152/98, SPO2 100% on room air.  135, potassium 3.9, chloride 103, CO2 21, glucose 138, BUN 13, creatinine 0.87.  AST 21, ALT 21, total bilirubin 0.6.  Lipase 28.  WBC 9.0, hemoglobin 16.8, platelets 246.  Urinalysis unrevealing.  CT abdomen/pelvis with no evidence of acute abnormality, probable mild hepatic steatosis, Lap-Band apparatus without gross complicating features.  Hospital service was consulted for further evaluation and management of progressive abdominal pain. ? ?Hospital  course: ? ?Intractable nausea/vomiting ?Hx morbid obesity s/p laparoscopic gastric banding ?Patient presenting with 3-week history of progressive nausea/vomiting associated with abdominal pain.  Reports symptoms onset after recent start of GLP-1 agonist Wegovy.  CT abdomen/pelvis unrevealing.  Afebrile without leukocytosis.  Lipase within normal limits.  LFTs within normal limits.  Patient was initially started on metronidazole and ceftriaxone and received 3 doses then discontinued as unlikely infectious etiology.  Patient had fluid removed from the Lap-Band on 04/29/2021 by general surgery with immediate improvement of symptoms.  Seen initially by gastroenterology, now signed off and recommend outpatient follow-up for possible functional bowel pain.  Patient tolerating diet and general surgery recommends follow-up in their clinic in 3-4 weeks.  Discontinued [id number]; half-life 1 week but can take up to 5 weeks to completely clear. ?  ?Hypokalemia ?Repleted during hospitalization. ?  ?Obesity ?Body mass index is 34.66 kg/m?Marland Kitchen  Discussed with patient needs for aggressive lifestyle changes/weight loss as this complicates all facets of care.  Outpatient follow-up with PCP.   ?  ?Dyslipidemia ?Not on statin outpatient.  Outpatient follow-up PCP. ?  ?Elevated blood pressure on admission ?Currently not on antihypertensives outpatient. Blood pressure likely elevated on admission due to pain and stress of vomiting.  BP 106/70 Thomson discharge. ? ? ? ?Discharge Diagnoses:  ?Principal Problem: ?  Intractable nausea and vomiting ?Active Problems: ?  Essential hypertension ?  Dyslipidemia ?  Class 1 obesity ? ? ? ?Discharge Instructions ? ?Discharge Instructions   ? ? Call MD for:  difficulty breathing, headache or visual disturbances   Complete by: As directed ?  ? Call MD for:  extreme fatigue   Complete by: As directed ?  ? Call MD for:  persistant dizziness or light-headedness  Complete by: As directed ?  ? Call MD for:   persistant nausea and vomiting   Complete by: As directed ?  ? Call MD for:  severe uncontrolled pain   Complete by: As directed ?  ? Call MD for:  temperature >100.4   Complete by: As directed ?  ? Diet - low sodium heart healthy   Complete by: As directed ?  ? Increase activity slowly   Complete by: As directed ?  ? ?  ? ?Allergies as of 04/30/2021   ?No Known Allergies ?  ? ?  ?Medication List  ?  ? ?STOP taking these medications   ? ?methocarbamol 500 MG tablet ?Commonly known as: ROBAXIN ?  ?Wegovy 1 MG/0.5ML Soaj ?Generic drug: Semaglutide-Weight Management ?  ? ?  ? ?TAKE these medications   ? ?ADVIL PM PO ?Take 2 tablets by mouth daily as needed (pain). ?  ?hyoscyamine 0.125 MG SL tablet ?Commonly known as: LEVSIN SL ?Place 0.125 mg under the tongue every 4 (four) hours as needed for cramping. ?  ?ondansetron 4 MG tablet ?Commonly known as: ZOFRAN ?Take 1 tablet (4 mg total) by mouth every 6 (six) hours. ?What changed:  ?when to take this ?reasons to take this ?  ?oxyCODONE 5 MG immediate release tablet ?Commonly known as: Roxicodone ?Take 1 tablet (5 mg total) by mouth every 6 (six) hours as needed for up to 10 days for breakthrough pain. ?  ?pantoprazole 40 MG tablet ?Commonly known as: PROTONIX ?Take 40 mg by mouth daily. ?  ?traMADol 50 MG tablet ?Commonly known as: ULTRAM ?Take 50 mg by mouth every 6 (six) hours as needed for moderate pain or severe pain. ?  ? ?  ? ? Follow-up Information   ? ? Merri Brunette, MD. Schedule an appointment as soon as possible for a visit in 1 week(s).   ?Specialty: Internal Medicine ?Contact information: ?[address] ?[place] [address] ?[phone] ? ? ?  ?  ? ? [facility], [place]. Schedule an appointment as soon as possible for a visit in 2 week(s).   ?Specialty: General Surgery ?Contact information: ?[address] ?[address] ?[place] [address] ?[phone] ? ?  ?  ? ?  ?  ? ?  ? ?No Known  Allergies ? ?Consultations: ?General surgery ?[facility] gastroenterology ? ? ?Procedures/Studies: ?CT Abdomen Pelvis W Contrast ? ?Result Date: 04/27/2021 ?CLINICAL DATA:  46 year old male with acute LEFT abdominal and pelvic pain with vomiting for 1 and half weeks. EXAM: CT ABDOMEN AND PELVIS WITH CONTRAST TECHNIQUE: Multidetector CT imaging of the abdomen and pelvis was performed using the standard protocol following bolus administration of intravenous contrast. RADIATION DOSE REDUCTION: This exam was performed according to the departmental dose-optimization program which includes automated exposure control, adjustment of the mA and/or kV according to patient size and/or use of iterative reconstruction technique. CONTRAST:  OMNIPAQUE IOHEXOL 300 MG/ML  SOLN COMPARISON:  04/25/2021 and prior CTs FINDINGS: Lower chest: No acute abnormality. Hepatobiliary: Probable mild hepatic steatosis again noted without focal hepatic abnormality. The patient is status post cholecystectomy. No biliary dilatation. Pancreas: Unremarkable Spleen: Unremarkable Adrenals/Urinary Tract: No significant renal, adrenal or bladder abnormalities are noted. Stomach/Bowel: Lap band apparatus identified without gross complicating features. The stomach is otherwise unremarkable. Distal colonic surgical changes are identified. There is no evidence of bowel obstruction, definite bowel wall thickening or inflammatory changes. The appendix is normal. Colonic diverticulosis noted without evidence of acute diverticulitis. Vascular/Lymphatic: No  significant vascular findings are present. No enlarged abdominal or pelvic lymph nodes. Reproductive: Prostate is unremarkable. Other: No ascites, focal collection or pneumoperitoneum. Musculoskeletal: No acute or suspicious bony abnormalities are noted. IMPRESSION: 1. No evidence of acute abnormality. 2. Probable mild hepatic steatosis. 3. Lap band apparatus without gross complicating features. Electronically  Signed   By: Harmon PierJeffrey  Hu M.D.   On: 04/27/2021 14:02  ? ?CT Renal Stone Study ? ?Result Date: 04/25/2021 ?CLINICAL DATA:  Left lower quadrant pain and flank pain, renal stone suspected. EXAM: CT ABDOMEN AND PELVIS WITHOUT CONTRAST TECHNIQUE: Mult

## 2021-04-30 NOTE — Progress Notes (Signed)
[facility] Progress Note ? ?Barry Parsons 46 y.o. 1975-02-14 ? ?CC:  LLQ pain, nausea, vomiting ? ? ?Subjective: ?Patient seen and examined laying comfortably in bed. He notes his abdominal pain and nausea is improved since having his lap band fluid removed. He has been able to tolerate water. No bowel movements today.  ? ?ROS : Review of Systems  ?Constitutional:  Negative for chills and fever.  ?Gastrointestinal:  Positive for abdominal pain (improved from yesterday). Negative for blood in stool, constipation, diarrhea, heartburn, melena, nausea and vomiting.  ?Genitourinary:  Negative for dysuria and urgency.  ?Neurological:  Negative for dizziness and headaches.  ? ? ? ?Objective: ?Vital signs in last 24 hours: ?Vitals:  ? 04/29/21 2051 04/30/21 0527  ?BP: 104/66 106/70  ?Pulse: 87 76  ?Resp: 18 18  ?Temp: 98.8 ?F (37.1 ?C) 97.9 ?F (36.6 ?C)  ?SpO2: 98% 99%  ? ? ?Physical Exam: ? ?General:  Alert, cooperative, no distress, appears stated age  ?Head:  Normocephalic, without obvious abnormality, atraumatic  ?Eyes:  Anicteric sclera, EOM's intact  ?Lungs:   Clear to auscultation bilaterally, respirations unlabored  ?Heart:  Regular rate and rhythm, S1, S2 normal  ?Abdomen:   Soft, Tenderness to palpation of the lower abdomen, bowel sounds active all four quadrants,  no masses, no guarding or rebound tenderness  ?Extremities: Extremities normal, atraumatic, no  edema  ?Pulses: 2+ and symmetric  ? ? ?Lab Results: ?Recent Labs  ?  04/28/21 ?0303 04/29/21 ?0404 04/30/21 ?0407  ?NA 135 136 139  ?K 3.3* 3.8 3.2*  ?CL 106 108 108  ?CO2 21* 21* 23  ?GLUCOSE 118* 133* 105*  ?BUN 7 <5* 8  ?CREATININE 0.76 0.69 0.75  ?CALCIUM 8.8* 8.7* 8.9  ?MG 2.0  --   --   ?PHOS 3.3  --   --   ? ?Recent Labs  ?  04/27/21 ?1258  ?AST 21  ?ALT 21  ?ALKPHOS 71  ?BILITOT 0.6  ?PROT 7.7  ?ALBUMIN 4.6  ? ?Recent Labs  ?  04/29/21 ?0404 04/30/21 ?0407  ?WBC 10.3 7.0  ?NEUTROABS  --  3.6  ?HGB 15.4 14.7  ?HCT 43.7 41.3  ?MCV 90.5 91.2   ?PLT 222 206  ? ?No results for input(s): LABPROT, INR in the last 72 hours. ? ? ? ?Assessment ?LLQ pain ?Nausea ?vomiting ?  ?Possible recurrent episode of chronic abdominal pain not previously relieved with surgery or medical; management. No significant findings to explain pain on CT scan. No signs of diverticulitis. Labs overall continue to be within normal limits.  ?  ?Possible nausea and vomiting due to use of semiglutide for weight loss, no longer tolerating gastric band well or possible gastroparesis. He notes improvement in pain and nausea with the lap band fluid removed,  ?  ?CT A/P w/ contrast 04/27/2021 ?IMPRESSION: ?1. No evidence of acute abnormality. ?2. Probable mild hepatic steatosis. ?3. Lap band apparatus without gross complicating features ? ?Plan: ?Continue supportive care ?Progress diet per general surgery recommendation ?Follow up outpatient for possible functional bowel pain.  ?[facility] will sign off.  ? ? ?Emmit Alexanders PA-C ?04/30/2021, 10:40 AM ? ?Contact #  [phone] ? ?

## 2021-04-30 NOTE — Progress Notes (Signed)
? ?Progress Note ? ?   ?Subjective: ?Had a much better night last night and no further nausea/emesis so far. Abdominal pain is also improved. He has only been sipping on water. Ambulating some. He denies history of inguinal hernia. He states LLQ pain only occurs with episodes of N/V and bowel movements ? ?Objective: ?Vital signs in last 24 hours: ?Temp:  [97.9 ?F (36.6 ?C)-99 ?F (37.2 ?C)] 97.9 ?F (36.6 ?C) (04/05 [id number]) ?Pulse Rate:  [76-95] 76 (04/05 0527) ?Resp:  [18] 18 (04/05 0527) ?BP: (104-152)/(66-90) 106/70 (04/05 0527) ?SpO2:  [98 %-100 %] 99 % (04/05 0527) ?Last BM Date : 04/28/21 ? ?Intake/Output from previous day: ?04/04 0701 - 04/05 0700 ?In: 2797 [P.O.:600; I.V.:1897; IV Piggyback:300] ?Out: -  ?Intake/Output this shift: ?No intake/output data recorded. ? ?PE: ?General: pleasant, WD, male who is laying in bed in NAD ?HEENT: head is normocephalic, atraumatic. Mouth is pink and moist ?Heart: Palpable radial pulses bilaterally ?Lungs:  Respiratory effort nonlabored on room air ?Abd: soft, ND, +BS, nontender on exam this am ?MSK: all 4 extremities are symmetrical with no cyanosis, clubbing, or edema. ?GU: while standing no palpable inguinal hernia bilaterally at rest or with valsalva. No TTP ?Skin: warm and dry ?Psych: A&Ox3 with an appropriate affect.  ? ? ?Lab Results:  ?Recent Labs  ?  04/29/21 ?0404 04/30/21 ?0407  ?WBC 10.3 7.0  ?HGB 15.4 14.7  ?HCT 43.7 41.3  ?PLT 222 206  ? ? ?BMET ?Recent Labs  ?  04/29/21 ?0404 04/30/21 ?0407  ?NA 136 139  ?K 3.8 3.2*  ?CL 108 108  ?CO2 21* 23  ?GLUCOSE 133* 105*  ?BUN <5* 8  ?CREATININE 0.69 0.75  ?CALCIUM 8.7* 8.9  ? ? ?PT/INR ?No results for input(s): LABPROT, INR in the last 72 hours. ?CMP  ?   ?Component Value Date/Time  ? NA 139 04/30/2021 0407  ? K 3.2 (L) 04/30/2021 0407  ? CL 108 04/30/2021 0407  ? CO2 23 04/30/2021 0407  ? GLUCOSE 105 (H) 04/30/2021 0407  ? BUN 8 04/30/2021 0407  ? CREATININE 0.75 04/30/2021 0407  ? CREATININE 0.78 09/12/2010 1100  ?  CALCIUM 8.9 04/30/2021 0407  ? PROT 7.7 04/27/2021 1258  ? ALBUMIN 4.6 04/27/2021 1258  ? AST 21 04/27/2021 1258  ? ALT 21 04/27/2021 1258  ? ALKPHOS 71 04/27/2021 1258  ? BILITOT 0.6 04/27/2021 1258  ? GFRNONAA >60 04/30/2021 0407  ? GFRAA >60 01/03/2016 1440  ? ?Lipase  ?   ?Component Value Date/Time  ? LIPASE 28 04/27/2021 1258  ? ? ? ? ? ?Studies/Results: ?No results found. ? ?Anti-infectives: ?Anti-infectives (From admission, onward)  ? ? Start     Dose/Rate Route Frequency Ordered Stop  ? 04/28/21 1045  cefTRIAXone (ROCEPHIN) 2 g in sodium chloride 0.9 % 100 mL IVPB       ? 2 g ?200 mL/hr over 30 Minutes Intravenous Daily 04/28/21 0948    ? 04/28/21 1015  metroNIDAZOLE (FLAGYL) IVPB 500 mg       ? 500 mg ?100 mL/hr over 60 Minutes Intravenous Every 12 hours 04/28/21 0929 05/03/21 1014  ? ?  ? ? ? ?Assessment/Plan ?Abdominal pain, nausea, vomiting ?Hx laparoscopic gastric banding ?- CT scan negative for any acute intraabdominal pathology, it shows the lap band without any complicating factors.  ?- No indication for acute surgical intervention ?- question if this may be a side effect of the Wagovy. The half life of this medication is 1 week but can  take up to 5 weeks to completely clear the system.  ?- full liquid diet this am ?- fluid removed from lap band yesterday and his symptoms have improved. Will monitor diet tolerance today. Ultimately will follow up in our office to have lap band reevaluated/fluid placed once acute episode fully resolved ?- LLQ pain also improved. No left inguinal hernia on CT or on exam today ?  ?ID - rocephin/flagyl ?VTE - lovenox ?FEN - FLD, Boost ?Foley - none ?  ?Obesity BMI 34.66 ? ?I reviewed Consultant (GI) notes, hospitalist notes, last 24 h vitals and pain scores, last 48 h intake and output, and last 24 h labs and trends. ?  ? ? LOS: 3 days  ? ?Winferd Humphrey, PA-C ?[facility] ?04/30/2021, 10:08 AM ?Please see Amion for pager number during day hours  7:00am-4:30pm  ?

## 2021-04-30 NOTE — Plan of Care (Signed)
Instructions were reviewed with patient. All questions were answered. Patient was transported to main entrance by wheelchair. ° °

## 2021-04-30 NOTE — Progress Notes (Addendum)
?PROGRESS NOTE ? ? ? ?Barry Parsons  OIZ:[id number] DOB: 12/21/1975 DOA: 04/27/2021 ?PCP: Merri Brunette, MD  ? ? 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  Onset roughly 3 weeks ago after he started GLP-1 agonist Wegovy weekly injection for weight loss.  Patient initially was seen in ED on 3/31 with the same complaint, CT abdomen at that time did not show any acute changes, patient was treated with antibiotic, analgesics and was discharged home.  Since returning home, patient's symptoms progressively worsened and was not able to hold down any food or drink prompting return to the ED on 4/2. ? ?In the ED, temperature 98.6 ?F, HR 107, RR 18, BP 152/98, SPO2 100% on room air.  135, potassium 3.9, chloride 103, CO2 21, glucose 138, BUN 13, creatinine 0.87.  AST 21, ALT 21, total bilirubin 0.6.  Lipase 28.  WBC 9.0, hemoglobin 16.8, platelets 246.  Urinalysis unrevealing.  CT abdomen/pelvis with no evidence of acute abnormality, probable mild hepatic steatosis, Lap-Band apparatus without gross complicating features.  Hospital service was consulted for further evaluation and management of progressive abdominal pain. ? ?Assessment & Plan: ?  ?Intractable nausea/vomiting ?Hx morbid obesity s/p laparoscopic gastric banding ?Patient presenting with 3-week history of progressive nausea/vomiting associated with abdominal pain.  Reports symptoms onset after recent start of GLP-1 agonist Wegovy.  CT abdomen/pelvis unrevealing.  Afebrile without leukocytosis.  Lipase within normal limits.  LFTs within normal limits.  Seen initially by gastroenterology, now signed off and recommend outpatient follow-up for possible functional bowel pain. ?--General surgery following, appreciate  assistance ?--s/p fluid removal from lap band on 4/4, with improvement of symptoms ?--holding Wegovy; half-life 1 week but can take up to 5 weeks to completely clear ?--Full liquid diet ?--D5 NS at 75 mL/hr ?--DC Flagyl/ceftriaxone today ?--Protonix 40 mg IV daily ? ?Hypokalemia ?Potassium 3.2, will replete. ?--Repeat electrolytes in a.m. ? ?Obesity ?Body mass index is 34.66 kg/m?Marland Kitchen  Discussed with patient needs for aggressive lifestyle changes/weight loss as this complicates all facets of care.  Outpatient follow-up with PCP.   ? ?Dyslipidemia ?Not on statin outpatient.  Outpatient follow-up PCP. ? ?Elevated blood pressure on admission ?Currently not on antihypertensives outpatient.  BP 106/70, well controlled.  Blood pressure likely elevated on admission due to pain and stress of vomiting. ? ? ?DVT prophylaxis: SCDs Start: 04/27/21 1703 ? ?  Code Status: Full Code ?Family Communication: No family present at bedside this morning ? ?Disposition Plan:  ?Level of care: Med-Surg ?Status is: Inpatient ?Remains inpatient appropriate because: Needs further advancement of diet, awaiting general surgery to sign off ?  ? ?Consultants:  ?[facility] -signed off 4/5 ?General surgery ? ?Procedures:  ?Lap-Band fluid removal by general surgery 4/4 ? ?Antimicrobials:  ?Ceftriaxone 4/2 - 4/5 ?Flagyl 4/2 - 4/5 ? ? ?Subjective: ?Patient seen examined bedside, resting comfortably.  Reports abdominal discomfort much improved following fluid removed from Lap-Band yesterday.  Has yet to eat or drink much, "afraid of nausea".  Discussed with patient needs to attempt to eat and drink today to see how he can progress.  Seen by general surgery this morning.  No other questions or concerns at this time.  Denies headache, no chest pain, no palpitations, no shortness of breath, no abdominal pain today, no fever/chills/night sweats, no nausea/vomiting/diarrhea, no  weakness, no fatigue, no paresthesias.  No acute events overnight per  nursing staff. ? ?Objective: ?Vitals:  ? 04/29/21 1229 04/29/21 2051 04/30/21 0527 04/30/21 1231  ?BP: (!) 152/90 104/66 106/70 111/71  ?Pulse: 95 87 76 76  ?Resp: 18 18 18 18   ?Temp: 99 ?F (37.2 ?C) 98.8 ?F (37.1 ?C) 97.9 ?F (36.6 ?C) 98.6 ?F (37 ?C)  ?TempSrc: Oral Oral Oral Oral  ?SpO2: 100% 98% 99% 99%  ?Weight:      ?Height:      ? ? ?Intake/Output Summary (Last 24 hours) at 04/30/2021 1314 ?Last data filed at 04/30/2021 0844 ?Gross per 24 hour  ?Intake 3036.97 ml  ?Output --  ?Net 3036.97 ml  ? ?Filed Weights  ? 04/27/21 1225  ?Weight: 114.3 kg  ? ? ?Examination: ? ?Physical Exam: ?GEN: NAD, alert and oriented x 3, obese ?HEENT: NCAT, PERRL, EOMI, sclera clear, MMM ?PULM: CTAB w/o wheezes/crackles, normal respiratory effort, on room air ?CV: RRR w/o M/G/R ?GI: abd soft, NTND, NABS, no R/G/M ?MSK: no peripheral edema, muscle strength globally intact 5/5 bilateral upper/lower extremities ?NEURO: CN II-XII intact, no focal deficits, sensation to light touch intact ?PSYCH: normal mood/affect ?Integumentary: dry/intact, no rashes or wounds ? ? ? ?Data Reviewed: I have personally reviewed following labs and imaging studies ? ?CBC: ?Recent Labs  ?Lab 04/25/21 ?0904 04/27/21 ?1258 04/28/21 ?0303 04/29/21 ?0404 04/30/21 ?0407  ?WBC 9.1 9.0 11.4* 10.3 7.0  ?NEUTROABS  --   --   --   --  3.6  ?HGB 17.8* 16.8 15.6 15.4 14.7  ?HCT 49.5 46.5 43.2 43.7 41.3  ?MCV 88.9 89.1 89.4 90.5 91.2  ?PLT 220 246 247 222 206  ? ?Basic Metabolic Panel: ?Recent Labs  ?Lab 04/25/21 ?0904 04/27/21 ?1258 04/28/21 ?0303 04/29/21 ?0404 04/30/21 ?0407  ?NA 134* 135 135 136 139  ?K 4.0 3.9 3.3* 3.8 3.2*  ?CL 100 103 106 108 108  ?CO2 20* 21* 21* 21* 23  ?GLUCOSE 136* 138* 118* 133* 105*  ?BUN 15 13 7  <5* 8  ?CREATININE 1.01 0.87 0.76 0.69 0.75  ?CALCIUM 9.8 9.2 8.8* 8.7* 8.9  ?MG  --   --  2.0  --   --   ?PHOS  --   --  3.3  --   --   ? ?GFR: ?Estimated Creatinine Clearance: 147.9 mL/min (by C-G formula based on SCr of 0.75 mg/dL). ?Liver Function  Tests: ?Recent Labs  ?Lab 04/25/21 ?0904 04/27/21 ?1258  ?AST 33 21  ?ALT 26 21  ?ALKPHOS 81 71  ?BILITOT 1.9* 0.6  ?PROT 8.3* 7.7  ?ALBUMIN 4.8 4.6  ? ?Recent Labs  ?Lab 04/25/21 ?0904 04/27/21 ?1258  ?LIPASE 28 28  ? ?No results for input(s): AMMONIA in the last 168 hours. ?Coagulation Profile: ?No results for input(s): INR, PROTIME in the last 168 hours. ?Cardiac Enzymes: ?No results for input(s): CKTOTAL, CKMB, CKMBINDEX, TROPONINI in the last 168 hours. ?BNP (last 3 results) ?No results for input(s): PROBNP in the last 8760 hours. ?HbA1C: ?No results for input(s): HGBA1C in the last 72 hours. ?CBG: ?No results for input(s): GLUCAP in the last 168 hours. ?Lipid Profile: ?No results for input(s): CHOL, HDL, LDLCALC, TRIG, CHOLHDL, LDLDIRECT in the last 72 hours. ?Thyroid Function Tests: ?No results for input(s): TSH, T4TOTAL, FREET4, T3FREE, THYROIDAB in the last 72 hours. ?Anemia Panel: ?No results for input(s): VITAMINB12, FOLATE, FERRITIN, TIBC, IRON, RETICCTPCT in the last 72 hours. ?Sepsis Labs: ?No results for input(s): PROCALCITON, LATICACIDVEN in the last 168 hours. ? ?  Recent Results (from the past 240 hour(s))  ?Resp Panel by RT-PCR (Flu A&B, Covid) Nasopharyngeal Swab     Status: None  ? Collection Time: 04/27/21  4:35 PM  ? Specimen: Nasopharyngeal Swab; Nasopharyngeal(NP) swabs in vial transport medium  ?Result Value Ref Range Status  ? SARS Coronavirus 2 by RT PCR NEGATIVE NEGATIVE Final  ?  Comment: (NOTE) ?SARS-CoV-2 target nucleic acids are NOT DETECTED. ? ?The SARS-CoV-2 RNA is generally detectable in upper respiratory ?specimens during the acute phase of infection. The lowest ?concentration of SARS-CoV-2 viral copies this assay can detect is ?138 copies/mL. A negative result does not preclude SARS-Cov-2 ?infection and should not be used as the sole basis for treatment or ?other patient management decisions. A negative result may occur with  ?improper specimen collection/handling, submission of  specimen other ?than nasopharyngeal swab, presence of viral mutation(s) within the ?areas targeted by this assay, and inadequate number of viral ?copies(<138 copies/mL). A negative result must be combined with

## 2021-05-15 DIAGNOSIS — Z9884 Bariatric surgery status: Secondary | ICD-10-CM | POA: Diagnosis not present

## 2021-08-09 DIAGNOSIS — L02412 Cutaneous abscess of left axilla: Secondary | ICD-10-CM | POA: Diagnosis not present

## 2021-08-11 DIAGNOSIS — L0291 Cutaneous abscess, unspecified: Secondary | ICD-10-CM | POA: Diagnosis not present

## 2022-04-14 DIAGNOSIS — Z0000 Encounter for general adult medical examination without abnormal findings: Secondary | ICD-10-CM | POA: Diagnosis not present

## 2022-04-14 DIAGNOSIS — M549 Dorsalgia, unspecified: Secondary | ICD-10-CM | POA: Diagnosis not present

## 2022-04-14 DIAGNOSIS — Z125 Encounter for screening for malignant neoplasm of prostate: Secondary | ICD-10-CM | POA: Diagnosis not present

## 2022-04-14 DIAGNOSIS — R7303 Prediabetes: Secondary | ICD-10-CM | POA: Diagnosis not present

## 2022-04-14 DIAGNOSIS — G8929 Other chronic pain: Secondary | ICD-10-CM | POA: Diagnosis not present

## 2022-04-14 DIAGNOSIS — R739 Hyperglycemia, unspecified: Secondary | ICD-10-CM | POA: Diagnosis not present

## 2022-04-14 DIAGNOSIS — E785 Hyperlipidemia, unspecified: Secondary | ICD-10-CM | POA: Diagnosis not present

## 2022-04-14 DIAGNOSIS — M542 Cervicalgia: Secondary | ICD-10-CM | POA: Diagnosis not present

## 2022-04-30 DIAGNOSIS — R748 Abnormal levels of other serum enzymes: Secondary | ICD-10-CM | POA: Diagnosis not present

## 2022-04-30 DIAGNOSIS — R7303 Prediabetes: Secondary | ICD-10-CM | POA: Diagnosis not present

## 2022-05-13 DIAGNOSIS — M25512 Pain in left shoulder: Secondary | ICD-10-CM | POA: Diagnosis not present

## 2022-05-13 DIAGNOSIS — M79642 Pain in left hand: Secondary | ICD-10-CM | POA: Diagnosis not present

## 2022-05-13 DIAGNOSIS — M79641 Pain in right hand: Secondary | ICD-10-CM | POA: Diagnosis not present

## 2022-05-13 DIAGNOSIS — G8929 Other chronic pain: Secondary | ICD-10-CM | POA: Diagnosis not present

## 2022-07-21 DIAGNOSIS — R748 Abnormal levels of other serum enzymes: Secondary | ICD-10-CM | POA: Diagnosis not present

## 2022-07-21 DIAGNOSIS — K760 Fatty (change of) liver, not elsewhere classified: Secondary | ICD-10-CM | POA: Diagnosis not present

## 2022-09-15 DIAGNOSIS — R748 Abnormal levels of other serum enzymes: Secondary | ICD-10-CM | POA: Diagnosis not present

## 2022-09-15 DIAGNOSIS — R6882 Decreased libido: Secondary | ICD-10-CM | POA: Diagnosis not present

## 2022-09-15 DIAGNOSIS — G894 Chronic pain syndrome: Secondary | ICD-10-CM | POA: Diagnosis not present

## 2022-09-15 DIAGNOSIS — E669 Obesity, unspecified: Secondary | ICD-10-CM | POA: Diagnosis not present

## 2022-09-18 DIAGNOSIS — R6882 Decreased libido: Secondary | ICD-10-CM | POA: Diagnosis not present

## 2023-01-13 DIAGNOSIS — R748 Abnormal levels of other serum enzymes: Secondary | ICD-10-CM | POA: Diagnosis not present

## 2023-01-13 DIAGNOSIS — N529 Male erectile dysfunction, unspecified: Secondary | ICD-10-CM | POA: Diagnosis not present

## 2023-01-21 DIAGNOSIS — N529 Male erectile dysfunction, unspecified: Secondary | ICD-10-CM | POA: Diagnosis not present

## 2023-01-21 DIAGNOSIS — R748 Abnormal levels of other serum enzymes: Secondary | ICD-10-CM | POA: Diagnosis not present

## 2023-04-21 ENCOUNTER — Other Ambulatory Visit: Payer: Self-pay | Admitting: Internal Medicine

## 2023-04-21 DIAGNOSIS — R61 Generalized hyperhidrosis: Secondary | ICD-10-CM | POA: Diagnosis not present

## 2023-04-21 DIAGNOSIS — I10 Essential (primary) hypertension: Secondary | ICD-10-CM | POA: Diagnosis not present

## 2023-04-21 DIAGNOSIS — Z0000 Encounter for general adult medical examination without abnormal findings: Secondary | ICD-10-CM | POA: Diagnosis not present

## 2023-04-21 DIAGNOSIS — M79641 Pain in right hand: Secondary | ICD-10-CM | POA: Diagnosis not present

## 2023-04-21 DIAGNOSIS — R748 Abnormal levels of other serum enzymes: Secondary | ICD-10-CM

## 2023-04-21 DIAGNOSIS — R7303 Prediabetes: Secondary | ICD-10-CM | POA: Diagnosis not present

## 2023-04-21 DIAGNOSIS — Z125 Encounter for screening for malignant neoplasm of prostate: Secondary | ICD-10-CM | POA: Diagnosis not present

## 2023-04-21 DIAGNOSIS — M19049 Primary osteoarthritis, unspecified hand: Secondary | ICD-10-CM | POA: Diagnosis not present

## 2023-04-30 ENCOUNTER — Ambulatory Visit
Admission: RE | Admit: 2023-04-30 | Discharge: 2023-04-30 | Disposition: A | Discharge status: Home / Self Care | Source: Ambulatory Visit | Attending: Internal Medicine | Admitting: Internal Medicine

## 2023-04-30 DIAGNOSIS — K760 Fatty (change of) liver, not elsewhere classified: Secondary | ICD-10-CM | POA: Diagnosis not present

## 2023-04-30 DIAGNOSIS — R748 Abnormal levels of other serum enzymes: Secondary | ICD-10-CM

## 2023-06-30 DIAGNOSIS — M25561 Pain in right knee: Secondary | ICD-10-CM | POA: Diagnosis not present

## 2023-06-30 DIAGNOSIS — M2550 Pain in unspecified joint: Secondary | ICD-10-CM | POA: Diagnosis not present

## 2023-06-30 DIAGNOSIS — M79643 Pain in unspecified hand: Secondary | ICD-10-CM | POA: Diagnosis not present

## 2023-06-30 DIAGNOSIS — M79672 Pain in left foot: Secondary | ICD-10-CM | POA: Diagnosis not present

## 2023-06-30 DIAGNOSIS — M79642 Pain in left hand: Secondary | ICD-10-CM | POA: Diagnosis not present

## 2023-06-30 DIAGNOSIS — M25562 Pain in left knee: Secondary | ICD-10-CM | POA: Diagnosis not present

## 2023-06-30 DIAGNOSIS — M79641 Pain in right hand: Secondary | ICD-10-CM | POA: Diagnosis not present

## 2023-06-30 DIAGNOSIS — M2560 Stiffness of unspecified joint, not elsewhere classified: Secondary | ICD-10-CM | POA: Diagnosis not present

## 2023-06-30 DIAGNOSIS — M79671 Pain in right foot: Secondary | ICD-10-CM | POA: Diagnosis not present

## 2023-06-30 DIAGNOSIS — M549 Dorsalgia, unspecified: Secondary | ICD-10-CM | POA: Diagnosis not present

## 2023-08-11 DIAGNOSIS — M79643 Pain in unspecified hand: Secondary | ICD-10-CM | POA: Diagnosis not present

## 2023-08-11 DIAGNOSIS — L4050 Arthropathic psoriasis, unspecified: Secondary | ICD-10-CM | POA: Diagnosis not present

## 2023-08-11 DIAGNOSIS — M2560 Stiffness of unspecified joint, not elsewhere classified: Secondary | ICD-10-CM | POA: Diagnosis not present

## 2023-08-11 DIAGNOSIS — L409 Psoriasis, unspecified: Secondary | ICD-10-CM | POA: Diagnosis not present

## 2023-09-23 DIAGNOSIS — L4050 Arthropathic psoriasis, unspecified: Secondary | ICD-10-CM | POA: Diagnosis not present

## 2023-09-23 DIAGNOSIS — Z79899 Other long term (current) drug therapy: Secondary | ICD-10-CM | POA: Diagnosis not present

## 2023-09-23 DIAGNOSIS — Z1589 Genetic susceptibility to other disease: Secondary | ICD-10-CM | POA: Diagnosis not present

## 2023-09-23 DIAGNOSIS — L409 Psoriasis, unspecified: Secondary | ICD-10-CM | POA: Diagnosis not present

## 2023-11-24 DIAGNOSIS — Z1589 Genetic susceptibility to other disease: Secondary | ICD-10-CM | POA: Diagnosis not present

## 2023-11-24 DIAGNOSIS — L409 Psoriasis, unspecified: Secondary | ICD-10-CM | POA: Diagnosis not present

## 2023-11-24 DIAGNOSIS — L4050 Arthropathic psoriasis, unspecified: Secondary | ICD-10-CM | POA: Diagnosis not present

## 2023-11-24 DIAGNOSIS — Z79899 Other long term (current) drug therapy: Secondary | ICD-10-CM | POA: Diagnosis not present
